# Patient Record
Sex: Male | Born: 1935 | Race: White | Hispanic: No | Marital: Married | Smoking: Never smoker
Health system: Southern US, Community
[De-identification: ages and names within clinical notes are randomized; demographics above are authoritative.]

## PROBLEM LIST (undated history)

## (undated) DIAGNOSIS — C61 Malignant neoplasm of prostate: Secondary | ICD-10-CM

## (undated) DIAGNOSIS — Z860100 Personal history of colon polyps, unspecified: Secondary | ICD-10-CM

## (undated) DIAGNOSIS — I10 Essential (primary) hypertension: Secondary | ICD-10-CM

## (undated) DIAGNOSIS — K5790 Diverticulosis of intestine, part unspecified, without perforation or abscess without bleeding: Secondary | ICD-10-CM

## (undated) DIAGNOSIS — I480 Paroxysmal atrial fibrillation: Secondary | ICD-10-CM

## (undated) DIAGNOSIS — C679 Malignant neoplasm of bladder, unspecified: Secondary | ICD-10-CM

## (undated) DIAGNOSIS — I5032 Chronic diastolic (congestive) heart failure: Secondary | ICD-10-CM

## (undated) DIAGNOSIS — I38 Endocarditis, valve unspecified: Secondary | ICD-10-CM

## (undated) DIAGNOSIS — R42 Dizziness and giddiness: Secondary | ICD-10-CM

## (undated) DIAGNOSIS — H3320 Serous retinal detachment, unspecified eye: Secondary | ICD-10-CM

## (undated) DIAGNOSIS — H269 Unspecified cataract: Secondary | ICD-10-CM

## (undated) HISTORY — DX: Personal history of colon polyps, unspecified: Z86.0100

## (undated) HISTORY — PX: THORACENTESIS: SHX235

## (undated) HISTORY — DX: Malignant neoplasm of bladder, unspecified: C67.9

## (undated) HISTORY — DX: Paroxysmal atrial fibrillation: I48.0

## (undated) HISTORY — DX: Essential (primary) hypertension: I10

## (undated) HISTORY — DX: Dizziness and giddiness: R42

## (undated) HISTORY — PX: TONSILLECTOMY: SUR1361

## (undated) HISTORY — PX: CATARACT EXTRACTION: SUR2

## (undated) HISTORY — PX: SQUAMOUS CELL CARCINOMA EXCISION: SHX2433

## (undated) HISTORY — DX: Unspecified cataract: H26.9

## (undated) HISTORY — PX: COLONOSCOPY: SHX174

## (undated) HISTORY — DX: Diverticulosis of intestine, part unspecified, without perforation or abscess without bleeding: K57.90

## (undated) HISTORY — DX: Endocarditis, valve unspecified: I38

## (undated) HISTORY — DX: Personal history of colonic polyps: Z86.010

## (undated) HISTORY — PX: CYSTOSCOPY WITH BIOPSY: SHX5122

## (undated) HISTORY — DX: Malignant neoplasm of prostate: C61

---

## 1999-07-11 ENCOUNTER — Encounter: Payer: Self-pay | Admitting: Emergency Medicine

## 1999-07-11 ENCOUNTER — Emergency Department (HOSPITAL_COMMUNITY): Admission: EM | Admit: 1999-07-11 | Discharge: 1999-07-11 | Payer: Self-pay | Admitting: Emergency Medicine

## 2000-09-23 ENCOUNTER — Observation Stay (HOSPITAL_COMMUNITY): Admission: RE | Admit: 2000-09-23 | Discharge: 2000-09-24 | Payer: Self-pay | Admitting: Urology

## 2000-09-23 ENCOUNTER — Encounter (INDEPENDENT_AMBULATORY_CARE_PROVIDER_SITE_OTHER): Payer: Self-pay | Admitting: Specialist

## 2001-11-10 ENCOUNTER — Ambulatory Visit (HOSPITAL_BASED_OUTPATIENT_CLINIC_OR_DEPARTMENT_OTHER): Admission: RE | Admit: 2001-11-10 | Discharge: 2001-11-10 | Payer: Self-pay | Admitting: Urology

## 2001-11-10 ENCOUNTER — Encounter (INDEPENDENT_AMBULATORY_CARE_PROVIDER_SITE_OTHER): Payer: Self-pay | Admitting: Specialist

## 2002-04-13 DIAGNOSIS — C61 Malignant neoplasm of prostate: Secondary | ICD-10-CM

## 2002-04-13 HISTORY — DX: Malignant neoplasm of prostate: C61

## 2002-04-13 HISTORY — PX: RADIOACTIVE SEED IMPLANT: SHX5150

## 2002-06-13 ENCOUNTER — Encounter: Admission: RE | Admit: 2002-06-13 | Discharge: 2002-06-13 | Payer: Self-pay | Admitting: Urology

## 2002-06-13 ENCOUNTER — Encounter: Payer: Self-pay | Admitting: Urology

## 2002-06-15 ENCOUNTER — Ambulatory Visit (HOSPITAL_BASED_OUTPATIENT_CLINIC_OR_DEPARTMENT_OTHER): Admission: RE | Admit: 2002-06-15 | Discharge: 2002-06-15 | Payer: Self-pay | Admitting: Urology

## 2002-06-30 ENCOUNTER — Ambulatory Visit: Admission: RE | Admit: 2002-06-30 | Discharge: 2002-07-28 | Payer: Self-pay | Admitting: Radiation Oncology

## 2002-09-05 ENCOUNTER — Ambulatory Visit: Admission: RE | Admit: 2002-09-05 | Discharge: 2002-10-04 | Payer: Self-pay

## 2002-12-14 ENCOUNTER — Ambulatory Visit: Admission: RE | Admit: 2002-12-14 | Discharge: 2003-03-14 | Payer: Self-pay | Admitting: Radiation Oncology

## 2003-02-20 ENCOUNTER — Ambulatory Visit (HOSPITAL_COMMUNITY): Admission: RE | Admit: 2003-02-20 | Discharge: 2003-02-20 | Payer: Self-pay | Admitting: Urology

## 2003-02-20 ENCOUNTER — Ambulatory Visit (HOSPITAL_BASED_OUTPATIENT_CLINIC_OR_DEPARTMENT_OTHER): Admission: RE | Admit: 2003-02-20 | Discharge: 2003-02-20 | Payer: Self-pay | Admitting: Urology

## 2003-03-28 ENCOUNTER — Ambulatory Visit: Admission: RE | Admit: 2003-03-28 | Discharge: 2003-04-05 | Payer: Self-pay | Admitting: Radiation Oncology

## 2003-08-14 ENCOUNTER — Ambulatory Visit: Admission: RE | Admit: 2003-08-14 | Discharge: 2003-08-14 | Payer: Self-pay | Admitting: Radiation Oncology

## 2004-04-13 DIAGNOSIS — K5790 Diverticulosis of intestine, part unspecified, without perforation or abscess without bleeding: Secondary | ICD-10-CM

## 2004-04-13 HISTORY — DX: Diverticulosis of intestine, part unspecified, without perforation or abscess without bleeding: K57.90

## 2004-09-30 ENCOUNTER — Ambulatory Visit: Payer: Self-pay | Admitting: Gastroenterology

## 2004-10-09 ENCOUNTER — Encounter (INDEPENDENT_AMBULATORY_CARE_PROVIDER_SITE_OTHER): Payer: Self-pay | Admitting: Specialist

## 2004-10-09 ENCOUNTER — Ambulatory Visit: Payer: Self-pay | Admitting: Internal Medicine

## 2005-02-03 ENCOUNTER — Ambulatory Visit (HOSPITAL_COMMUNITY): Admission: RE | Admit: 2005-02-03 | Discharge: 2005-02-03 | Payer: Self-pay | Admitting: Urology

## 2005-02-03 ENCOUNTER — Encounter (INDEPENDENT_AMBULATORY_CARE_PROVIDER_SITE_OTHER): Payer: Self-pay | Admitting: *Deleted

## 2005-02-03 ENCOUNTER — Ambulatory Visit (HOSPITAL_BASED_OUTPATIENT_CLINIC_OR_DEPARTMENT_OTHER): Admission: RE | Admit: 2005-02-03 | Discharge: 2005-02-03 | Payer: Self-pay | Admitting: Urology

## 2008-04-13 HISTORY — PX: PLEURAL EFFUSION DRAINAGE: SHX5099

## 2008-09-02 ENCOUNTER — Emergency Department (HOSPITAL_BASED_OUTPATIENT_CLINIC_OR_DEPARTMENT_OTHER): Admission: EM | Admit: 2008-09-02 | Discharge: 2008-09-02 | Payer: Self-pay | Admitting: Emergency Medicine

## 2008-09-02 ENCOUNTER — Ambulatory Visit: Payer: Self-pay | Admitting: Diagnostic Radiology

## 2008-09-04 ENCOUNTER — Ambulatory Visit (HOSPITAL_COMMUNITY): Admission: RE | Admit: 2008-09-04 | Discharge: 2008-09-04 | Payer: Self-pay | Admitting: Internal Medicine

## 2008-09-12 ENCOUNTER — Ambulatory Visit: Payer: Self-pay | Admitting: Thoracic Surgery

## 2008-09-19 ENCOUNTER — Ambulatory Visit: Payer: Self-pay | Admitting: Thoracic Surgery

## 2008-09-19 ENCOUNTER — Encounter: Admission: RE | Admit: 2008-09-19 | Discharge: 2008-09-19 | Payer: Self-pay | Admitting: Thoracic Surgery

## 2008-10-17 ENCOUNTER — Ambulatory Visit: Payer: Self-pay | Admitting: Thoracic Surgery

## 2008-10-17 ENCOUNTER — Encounter: Admission: RE | Admit: 2008-10-17 | Discharge: 2008-10-17 | Payer: Self-pay | Admitting: Thoracic Surgery

## 2009-09-25 ENCOUNTER — Encounter (INDEPENDENT_AMBULATORY_CARE_PROVIDER_SITE_OTHER): Payer: Self-pay | Admitting: *Deleted

## 2009-09-26 ENCOUNTER — Encounter (INDEPENDENT_AMBULATORY_CARE_PROVIDER_SITE_OTHER): Payer: Self-pay | Admitting: *Deleted

## 2009-11-08 ENCOUNTER — Encounter (INDEPENDENT_AMBULATORY_CARE_PROVIDER_SITE_OTHER): Payer: Self-pay | Admitting: *Deleted

## 2009-11-13 ENCOUNTER — Ambulatory Visit: Payer: Self-pay | Admitting: Internal Medicine

## 2009-11-19 ENCOUNTER — Telehealth (INDEPENDENT_AMBULATORY_CARE_PROVIDER_SITE_OTHER): Payer: Self-pay

## 2009-11-25 ENCOUNTER — Telehealth: Payer: Self-pay | Admitting: Internal Medicine

## 2009-11-27 ENCOUNTER — Ambulatory Visit: Payer: Self-pay | Admitting: Internal Medicine

## 2009-11-28 ENCOUNTER — Encounter: Payer: Self-pay | Admitting: Internal Medicine

## 2009-12-02 ENCOUNTER — Ambulatory Visit: Payer: Self-pay | Admitting: Cardiology

## 2009-12-05 ENCOUNTER — Ambulatory Visit: Payer: Self-pay | Admitting: Cardiology

## 2009-12-05 ENCOUNTER — Ambulatory Visit (HOSPITAL_COMMUNITY): Admission: RE | Admit: 2009-12-05 | Discharge: 2009-12-05 | Payer: Self-pay | Admitting: Cardiology

## 2010-03-19 ENCOUNTER — Emergency Department (HOSPITAL_BASED_OUTPATIENT_CLINIC_OR_DEPARTMENT_OTHER)
Admission: EM | Admit: 2010-03-19 | Discharge: 2010-03-20 | Payer: Self-pay | Source: Home / Self Care | Admitting: Emergency Medicine

## 2010-05-13 NOTE — Progress Notes (Signed)
Summary: prep ?'s  Phone Note Call from Patient Call back at Home Phone [phone]   Caller: Patient Call For: Dr. Marina Goodell Reason for Call: Talk to Nurse Summary of Call: prep ?'s Initial call taken by: Vallarie Mare,  November 19, 2009 2:54 PM  Follow-up for Phone Call        Called pt  at home regarding questions he had about what he could eat and not eat prior to his colonoscopy.Answered all his questions. He will call back if he has further questions.Ulis Rias RN  November 19, 2009 3:05 PM

## 2010-05-13 NOTE — Progress Notes (Signed)
Summary: Questions about procedure  Phone Note Call from Patient Call back at Home Phone [phone]   Caller: Patient Call For: Dr. Marina Goodell Reason for Call: Talk to Nurse Summary of Call: pt. has COL sch'd and took some fiber supplements Initial call taken by: Karna Christmas,  November 25, 2009 2:43 PM  Follow-up for Phone Call        Patient was afraid that he messed up his prep by taking a fiber supplement.  I assured him that it was okay, and for him not to take any more until after his procedure. Follow-up by: Clide Cliff RN,  November 25, 2009 3:13 PM

## 2010-05-13 NOTE — Letter (Signed)
Summary: Previsit letter  [facility]  [address] [place], [place] [address]   Phone: [phone]  Fax: [phone]       09/26/2009 MRN: [id number]  Aaron Lucas [address] [place], [place]  [address]  Dear Aaron Lucas,  Welcome to the Gastroenterology Division at [facility].    You are scheduled to see a nurse for your pre-procedure visit on 11-13-09 at 10:00A.M. on the 3rd floor at [facility], [address]  We ask that you try to arrive at our office 15 minutes prior to your appointment time to allow for check-in.  Your nurse visit will consist of discussing your medical and surgical history, your immediate family medical history, and your medications.    Please bring a complete list of all your medications or, if you prefer, bring the medication bottles and we will list them.  We will need to be aware of both prescribed and over the counter drugs.  We will need to know exact dosage information as well.  If you are on blood thinners (Coumadin, Plavix, Aggrenox, Ticlid, etc.) please call our office today/prior to your appointment, as we need to consult with your physician about holding your medication.   Please be prepared to read and sign documents such as consent forms, a financial agreement, and acknowledgement forms.  If necessary, and with your consent, a friend or relative is welcome to sit-in on the nurse visit with you.  Please bring your insurance card so that we may make a copy of it.  If your insurance requires a referral to see a specialist, please bring your referral form from your primary care physician.  No co-pay is required for this nurse visit.     If you cannot keep your appointment, please call [phone] to cancel or reschedule prior to your appointment date.  This allows [place] the opportunity to schedule an appointment for another patient in need of care.    Thank you for choosing [facility] for your medical needs.  We  appreciate the opportunity to care for you.  Please visit [place] at our website  to learn more about our practice.                     Sincerely.                                                                                                                   The Gastroenterology Division

## 2010-05-13 NOTE — Letter (Signed)
Summary: Colonoscopy Letter  [facility]  [address] [place], [place] [address]   Phone: [phone]  Fax: [phone]      September 25, 2009 MRN: [id number]   Aaron Lucas [address] [place], [place]  [address]   Dear [name],   According to your medical record, it is time for you to schedule a Colonoscopy. The American Cancer Society recommends this procedure as a method to detect early colon cancer. Patients with a family history of colon cancer, or a personal history of colon polyps or inflammatory bowel disease are at increased risk.  This letter has been generated based on the recommendations made at the time of your procedure. If you feel that in your particular situation this may no longer apply, please contact our office.  Please call our office at [phone] to schedule this appointment or to update your records at your earliest convenience.  Thank you for cooperating with [place] to provide you with the very best care possible.   Sincerely,  Wilhemina Bonito. Marina Goodell, M.D.  [facility] Gastroenterology Division [phone]

## 2010-05-13 NOTE — Miscellaneous (Signed)
Summary: [facility] Previsit/prep  Clinical Lists Changes  Medications: Added new medication of MOVIPREP 100 GM  SOLR (PEG-KCL-NACL-NASULF-NA ASC-C) As per prep instructions. - Signed Rx of MOVIPREP 100 GM  SOLR (PEG-KCL-NACL-NASULF-NA ASC-C) As per prep instructions.;  #1 x 0;  Signed;  Entered by: Wyona Almas RN;  Authorized by: Hilarie Fredrickson MD;  Method used: Electronically to [facility] [phone]*, [address] [place], [place]  [address], Ph: [phone], Fax: [phone] Allergies: Added new allergy or adverse reaction of PENICILLIN Added new allergy or adverse reaction of * DIAMOX Added new allergy or adverse reaction of CIPRO Added new allergy or adverse reaction of ERYTHROMYCIN Added new allergy or adverse reaction of DOXYCYCLINE Added new allergy or adverse reaction of * NEPTAZANE Observations: Added new observation of NKA: F (11/13/2009 9:51)    Prescriptions: MOVIPREP 100 GM  SOLR (PEG-KCL-NACL-NASULF-NA ASC-C) As per prep instructions.  #1 x 0   Entered by:   Wyona Almas RN   Authorized by:   Hilarie Fredrickson MD   Signed by:   Wyona Almas RN on 11/13/2009   Method used:   Electronically to        [facility] [phone]* (retail)       [address]       [place], [place]  [address]       Ph: [phone]       Fax: [phone]   RxID:   [id number]

## 2010-05-13 NOTE — Letter (Signed)
Summary: [facility] Instructions  [facility]  [address] [place], [place] [address]   Phone: [phone]  Fax: [phone]       TRUMAN ACEITUNO    06/22/1928    MRN: [id number]        Procedure Day Dorna Bloom:  [facility]  11/27/09     Arrival Time:  7:30AM      Procedure Time:  8:00AM     Location of Procedure:                    _ X_  [facility] (4th Floor)                       PREPARATION FOR COLONOSCOPY WITH MOVIPREP   Starting 5 days prior to your procedure 11/22/09 do not eat nuts, seeds, popcorn, corn, beans, peas,  salads, or any raw vegetables.  Do not take any fiber supplements (e.g. Metamucil, Citrucel, and Benefiber).  THE DAY BEFORE YOUR PROCEDURE         DATE: 11/26/09  DAY: TUESDAY  1.  Drink clear liquids the entire day-NO SOLID FOOD  2.  Do not drink anything colored red or purple.  Avoid juices with pulp.  No orange juice.  3.  Drink at least 64 oz. (8 glasses) of fluid/clear liquids during the day to prevent dehydration and help the prep work efficiently.  CLEAR LIQUIDS INCLUDE: Water Jello Ice Popsicles Tea (sugar ok, no milk/cream) Powdered fruit flavored drinks Coffee (sugar ok, no milk/cream) Gatorade Juice: apple, white grape, white cranberry  Lemonade Clear bullion, consomm, broth Carbonated beverages (any kind) Strained chicken noodle soup Hard Candy                             4.  In the morning, mix first dose of MoviPrep solution:    Empty 1 Pouch A and 1 Pouch B into the disposable container    Add lukewarm drinking water to the top line of the container. Mix to dissolve    Refrigerate (mixed solution should be used within 24 hrs)  5.  Begin drinking the prep at 5:00 p.m. The MoviPrep container is divided by 4 marks.   Every 15 minutes drink the solution down to the next mark (approximately 8 oz) until the full liter is complete.   6.  Follow completed prep with 16 oz of clear liquid of your choice  (Nothing red or purple).  Continue to drink clear liquids until bedtime.  7.  Before going to bed, mix second dose of MoviPrep solution:    Empty 1 Pouch A and 1 Pouch B into the disposable container    Add lukewarm drinking water to the top line of the container. Mix to dissolve    Refrigerate  THE DAY OF YOUR PROCEDURE      DATE: 11/27/09  DAY: WEDNESDAY  Beginning at 3:00AM (5 hours before procedure):         1. Every 15 minutes, drink the solution down to the next mark (approx 8 oz) until the full liter is complete.  2. Follow completed prep with 16 oz. of clear liquid of your choice.    3. You may drink clear liquids until 6:00AM (2 HOURS BEFORE PROCEDURE).   MEDICATION INSTRUCTIONS  Unless otherwise instructed, you should take regular prescription medications with a small sip of water   as early as possible the morning  of your procedure.  Additional medication instructions: Hold Lisinopril/HCTZ the morning of procedure.         OTHER INSTRUCTIONS  You will need a responsible adult at least 75 years of age to accompany you and drive you home.   This person must remain in the waiting room during your procedure.  Wear loose fitting clothing that is easily removed.  Leave jewelry and other valuables at home.  However, you may wish to bring a book to read or  an iPod/MP3 player to listen to music as you wait for your procedure to start.  Remove all body piercing jewelry and leave at home.  Total time from sign-in until discharge is approximately 2-3 hours.  You should go home directly after your procedure and rest.  You can resume normal activities the  day after your procedure.  The day of your procedure you should not:   Drive   Make legal decisions   Operate machinery   Drink alcohol   Return to work  You will receive specific instructions about eating, activities and medications before you leave.    The above instructions have been reviewed and  explained to me by  Wyona Almas RN  November 13, 2009 10:23 AM _    I fully understand and can verbalize these instructions _____________________________ Date _________

## 2010-05-13 NOTE — Letter (Signed)
Summary: Patient Notice- Polyp Results  [facility]  [address] [place], [place] [address]   Phone: [phone]  Fax: [phone]        November 28, 2009 MRN: [id number]    Aaron Lucas [address] [place], [place]  [address]    Dear Aaron Lucas,  I am pleased to inform you that the colon polyp(s) removed during your recent colonoscopy was (were) found to be benign (no cancer detected) upon pathologic examination.  I recommend you have a repeat colonoscopy examination in 3 years to look for recurrent polyps, as having colon polyps increases your risk for having recurrent polyps or even colon cancer in the future.  Should you develop new or worsening symptoms of abdominal pain, bowel habit changes or bleeding from the rectum or bowels, please schedule an evaluation with either your primary care physician or with me.  Additional information/recommendations:  __ No further action with gastroenterology is needed at this time. Please      follow-up with your primary care physician for your other healthcare      needs.   Please call us if you are having persistent problems or have questions about your condition that have not been fully answered at this time.  Sincerely,  Hilarie Fredrickson MD  This letter has been electronically signed by your physician.  Appended Document: Patient Notice- Polyp Results letter mailed

## 2010-05-13 NOTE — Procedures (Signed)
Summary: Colonoscopy  Patient: Aaron Lucas Note: All result statuses are Final unless otherwise noted.  Tests: (1) Colonoscopy (COL)   COL Colonoscopy           DONE     [facility]     [address]     [place], [place]  [address]           COLONOSCOPY PROCEDURE REPORT           PATIENT:  Aaron Lucas, Aaron Lucas  MR#:  [id number]     BIRTHDATE:  01-09-1929, 80 yrs. old  GENDER:  male     ENDOSCOPIST:  Wilhemina Bonito. Eda Keys, MD     REF. BY:  Surveillance Program Recall,     PROCEDURE DATE:  11/27/2009     PROCEDURE:  Colonoscopy with snare polypectomy x 4     ASA CLASS:  Class II     INDICATIONS:  history of pre-cancerous (adenomatous) colon polyps,     surveillance and high-risk screening ;index exam 6-06 w/ small     adenoma     MEDICATIONS:   Fentanyl 75 mcg IV, Versed 7 mg IV           DESCRIPTION OF PROCEDURE:   After the risks benefits and     alternatives of the procedure were thoroughly explained, informed     consent was obtained.  Digital rectal exam was performed and     revealed no abnormalities.   The LB CF-[id number] endoscope     was introduced through the anus and advanced to the cecum, which     was identified by both the appendix and ileocecal valve, without     limitations.Time to cecum = 4:17  min.  The quality of the prep     was excellent, using MoviPrep.  The instrument was then slowly     withdrawn (time = 18;13 min) as the colon was fully examined.     <<PROCEDUREIMAGES>>           FINDINGS:  Four polyps, all , 5mm, were found in the cecum,     transverse (2), and descending colon. Polyps were snared without     cautery. Retrieval was successful.   Moderate diverticulosis was     found in the left colon.   Retroflexed views in the rectum     revealed internal hemorrhoids.    The scope was then withdrawn     from the patient and the procedure completed.           COMPLICATIONS:  None     ENDOSCOPIC IMPRESSION:     1) Four polyps - removed     2)  Moderate diverticulosis in the left colon     3) Internal hemorrhoids           RECOMMENDATIONS:     1) Follow up colonoscopy in 3 years if 3 or more adenomas;     otherwise 5 years           ______________________________     Wilhemina Bonito. Eda Keys, MD           CC:  Geoffry Paradise, MD;The Patient           n.     Rosalie DoctorWilhemina Bonito. Eda Keys at 11/27/2009 08:51 AM           Ruthell Rummage, [id number]  Note: An exclamation mark (!) indicates a result that was not dispersed into the flowsheet.  Document Creation Date: 11/27/2009 8:51 AM _______________________________________________________________________  (1) Order result status: Final Collection or observation date-time: 11/27/2009 08:43 Requested date-time:  Receipt date-time:  Reported date-time:  Referring Physician:   Ordering Physician: Fransico Setters [phone]) Specimen Source:  Source: Launa Grill Order Number: [id number] Lab site:   Appended Document: Colonoscopy recall 3 yr     Procedures Next Due Date:    Colonoscopy: 11/2012

## 2010-05-16 ENCOUNTER — Emergency Department (INDEPENDENT_AMBULATORY_CARE_PROVIDER_SITE_OTHER): Payer: Medicare Other

## 2010-05-16 ENCOUNTER — Emergency Department (HOSPITAL_BASED_OUTPATIENT_CLINIC_OR_DEPARTMENT_OTHER)
Admission: EM | Admit: 2010-05-16 | Discharge: 2010-05-16 | Disposition: A | Discharge status: Home / Self Care | Payer: Medicare Other | Attending: Emergency Medicine | Admitting: Emergency Medicine

## 2010-05-16 DIAGNOSIS — M79609 Pain in unspecified limb: Secondary | ICD-10-CM | POA: Insufficient documentation

## 2010-05-16 DIAGNOSIS — I10 Essential (primary) hypertension: Secondary | ICD-10-CM | POA: Insufficient documentation

## 2010-05-16 DIAGNOSIS — M7989 Other specified soft tissue disorders: Secondary | ICD-10-CM

## 2010-05-16 DIAGNOSIS — W19XXXA Unspecified fall, initial encounter: Secondary | ICD-10-CM

## 2010-05-16 DIAGNOSIS — IMO0002 Reserved for concepts with insufficient information to code with codable children: Secondary | ICD-10-CM | POA: Insufficient documentation

## 2010-05-16 DIAGNOSIS — Y92838 Other recreation area as the place of occurrence of the external cause: Secondary | ICD-10-CM | POA: Insufficient documentation

## 2010-05-16 DIAGNOSIS — Y9239 Other specified sports and athletic area as the place of occurrence of the external cause: Secondary | ICD-10-CM | POA: Insufficient documentation

## 2010-06-23 ENCOUNTER — Ambulatory Visit (INDEPENDENT_AMBULATORY_CARE_PROVIDER_SITE_OTHER): Payer: Medicare Other | Admitting: Cardiology

## 2010-06-23 DIAGNOSIS — I359 Nonrheumatic aortic valve disorder, unspecified: Secondary | ICD-10-CM

## 2010-06-23 DIAGNOSIS — I10 Essential (primary) hypertension: Secondary | ICD-10-CM

## 2010-06-24 LAB — POCT CARDIAC MARKERS
CKMB, poc: 2 ng/mL (ref 1.0–8.0)
Myoglobin, poc: 55.4 ng/mL (ref 12–200)
Troponin i, poc: <0.05 ng/mL (ref 0.00–0.09)

## 2010-06-24 LAB — DIFFERENTIAL
Basophils Absolute: 0.1 10*3/uL (ref 0.0–0.1)
Basophils Relative: 1 % (ref 0–1)
Eosinophils Absolute: 0.1 10*3/uL (ref 0.0–0.7)
Eosinophils Relative: 1 % (ref 0–5)
Monocytes Relative: 11 % (ref 3–12)
Neutro Abs: 3.7 10*3/uL (ref 1.7–7.7)

## 2010-06-24 LAB — BASIC METABOLIC PANEL
BUN: 16 mg/dL (ref 6–23)
CO2: 24 mEq/L (ref 19–32)
Calcium: 8.8 mg/dL (ref 8.4–10.5)
Chloride: 100 mEq/L (ref 96–112)
GFR calc Af Amer: >60 mL/min (ref >60)
GFR calc non Af Amer: >60 mL/min (ref >60)

## 2010-06-24 LAB — CBC
HCT: 37.1 % — ABNORMAL LOW (ref 39.0–52.0)
Hemoglobin: 12.8 g/dL — ABNORMAL LOW (ref 13.0–17.0)
MCV: 92.9 fL (ref 78.0–100.0)
Platelets: 130 10*3/uL — ABNORMAL LOW (ref 150–400)
RBC: 3.99 MIL/uL — ABNORMAL LOW (ref 4.22–5.81)
RDW: 12.3 % (ref 11.5–15.5)
WBC: 5.8 10*3/uL (ref 4.0–10.5)

## 2010-07-22 LAB — BODY FLUID CELL COUNT WITH DIFFERENTIAL
Eos, Fluid: 3 %
Lymphs, Fluid: 47 %
Monocyte-Macrophage-Serous Fluid: 13 % — ABNORMAL LOW (ref 50–90)
Neutrophil Count, Fluid: 37 % — ABNORMAL HIGH (ref 0–25)
Total Nucleated Cell Count, Fluid: 5985 cu mm — ABNORMAL HIGH (ref 0–1000)

## 2010-07-22 LAB — BODY FLUID CULTURE
Culture: NO GROWTH
Gram Stain: NONE SEEN

## 2010-07-22 LAB — PATHOLOGIST SMEAR REVIEW

## 2010-08-26 NOTE — Letter (Signed)
September 19, 2008   Geoffry Paradise, M.D.  [address]  [place]  [place] [address]   Re:  Aaron Lucas, Aaron Lucas               DOB:  November 20, 1928   Dear Carmine Savoy,   The patient came back today after a CT scan.  His blood pressure is  148/76, pulse 72, respirations 18, and saturations were 98%.  Lungs were  clear to auscultation and percussion.  CT scan showed a small left  pleural effusion and healing rib fractures.  I do not feel he needs any  further therapy at the present time, but I will continue to follow him  and told him to come back if he gets shortness of breath or if starts  running a temperature.  If not, I will see him back in 4 weeks with a  chest x-ray.   Ines Bloomer, M.D.  Electronically Signed   DPB/MEDQ  D:  09/19/2008  T:  09/20/2008  Job:  [id number]

## 2010-08-26 NOTE — Letter (Signed)
September 12, 2008   Geoffry Paradise, MD  [address]  [place], [place] [address]   Re:  Aaron Lucas, Aaron Lucas               DOB:  11/18/1928   Dear Raiford Noble:   I appreciate seeing the patient.  Apparently this 77 year old the  patient fell at home approximately 5 weeks ago and so he developed  shortness of breath and weakness and was found to have a left pleural  effusion and rib fractures on the left from 3 through 8.  Had a  thoracentesis, which was done.  I do not know if cytologies were done,  but the fluid had a 5000 white cell count and mesothelial cells that  went along with reactive process.  He has had no fever or chills, but he  says that he has been having minimal pain now on his left chest but just  continues to have some weakness.  He just finished a course of Avelox  for 7 days that was given in the emergency room.   MEDICATIONS:  1. Multivitamins.  2. Clindamycin 150 mg a day.  3. Lisinopril 20/12.5.   ALLERGIES:  He is allergic to doxycycline, Naprosyn, erythromycin,  Cipro, Diamox, and penicillin.   FAMILY HISTORY:  Noncontributory except his father had a blood clot  after surgery.   SOCIAL HISTORY:  He is married and has 1 child.  Does not smoke,  occasional alcohol, glass of wine.   REVIEW OF SYSTEMS:  Vital Signs:  He is 195 pounds.  He is 5 feet 10  inches.  General:  His weight has been stable.  Cardiac:  No angina or  atrial fibrillation, but does have mitral valve prolapse.  Pulmonary:  No hemoptysis.  GI:  No nausea, vomiting, constipation, or diarrhea.  GU:  He had prostate cancer.  No kidney disease.  Vascular:  No  claudication, DVT.  Neurologic:  No dizziness, headaches, blackouts, or  seizures.  Musculoskeletal:  No arthritis.  Psychiatric:  No psychiatric  illnesses.  Eyes/ENT:  No changes in eyesight or hearing.  Hematologic:  No problems with bleeding or clotting disorders.   PHYSICAL EXAMINATION:  GENERAL:  He is a well-developed Caucasian male  in no  acute distress.  VITAL SIGNS:  His blood pressure was 134/80, pulse 81, respirations 18,  and saturations were 97%.  HEAD, EYES, EARS, NOSE, AND THROAT:  Unremarkable.  NECK:  Supple without thyromegaly.  There is no supraclavicular or  axillary adenopathy.  CHEST:  Clear to auscultation and percussion.  HEART:  Regular.  Sinus rhythm.  No murmurs.  ABDOMEN:  Soft.  There is no hepatosplenomegaly.  EXTREMITIES:  Pulses are 2+.  There is no clubbing or edema.  NEUROLOGIC:  He is oriented x3.  Sensory and motor intact.  Cranial  nerves are intact.   I have gone ahead and ordered a CT scan on him to look at what residual  fluid there is in the left lower lobe and also to look at his rib  fractures.  I do not know whether he will require any further drainage  procedure on the left side.  He still is somewhat weak and he has  chronic cough from his previous surgery, and I will see him back again  in 1 week to make further determination.  I appreciate the opportunity  of seeing the patient.   Sincerely,   Ines Bloomer, M.D.  Electronically Signed   DPB/MEDQ  D:  09/12/2008  T:  09/12/2008  Job:  [id number]

## 2010-08-26 NOTE — Letter (Signed)
October 17, 2008   Geoffry Paradise, MD  [address]  [place], [place] [address]   Re:  Aaron Lucas, Aaron Lucas               DOB:  1929/02/25   Dear Raiford Noble.   I saw the patient back in the office today.  His chest x-ray showed that  his pleural effusion has resolved and his fractures are healing well.  He is doing well overall.  His lungs were clear to auscultation and  percussion.  His blood pressure was 125/78, pulse 70, respirations 18,  and sats were 98%.  We will release him back to you.  I will be happy to  see him again if he has any future problems.   Sincerely,   Ines Bloomer, M.D.  Electronically Signed   DPB/MEDQ  D:  10/17/2008  T:  10/17/2008  Job:  [id number]

## 2010-08-29 NOTE — Op Note (Signed)
NAME:  Aaron Lucas, Aaron Lucas                         ACCOUNT NO.:  [id number]   MEDICAL RECORD NO.:  [id number]                   PATIENT TYPE:  AMB   LOCATION:  [facility]                                 FACILITY:  [facility]   PHYSICIAN:  Excell Seltzer. Annabell Howells, M.D.                 DATE OF BIRTH:  [birth year]   DATE OF PROCEDURE:  06/15/2002  DATE OF DISCHARGE:                                 OPERATIVE REPORT   PROCEDURES:  1. Prostate ultrasound with ultrasound with ultrasound-guided prostate     biopsy.  2. Cystoscopy.  Bilateral retrograde pyelograms with interpretation.  3. Biopsy and fulguration of 1 cm transitional cell carcinoma of the left     trigone.  4. Instillation of Mitomycin C.   PREOPERATIVE DIAGNOSES:  1. Prostate nodule with PSA elevation.  2. Recurrent transitional cell carcinoma of the bladder.   POSTOPERATIVE DIAGNOSES:  1. Prostate nodule with PSA elevation.  2. Recurrent transitional cell carcinoma of the bladder.   SURGEON:  Excell Seltzer. Annabell Howells, M.D.   ANESTHESIA:  General.   SPECIMENS:  12 core prostate biopsy and cut biopsies of the bladder tumor.   DRAIN:  Foley catheter.   COMPLICATIONS:  None.   INDICATIONS:  Mr. Papadopoulos is a 77 year old white male with a history of  recurrent low-grade transitional cell carcinoma of the bladder, was found on  recent surveillance cystoscopy to have a recurrence at the trigone and the  bladder neck.  Additionally, he was noted on rectal exam to have a nodule in  the left side of the prostate gland.   FINDINGS AND PROCEDURE:  The patient was taken to the operating room where a  general anesthetic was induced.  He was placed in the lithotomy position.  He had received antibiotics preoperatively.  A transrectal ultrasound of the  prostate was performed with the Siemens system using the 7.5 MHz probe.  The  examination revealed normal-appearing seminal vesicles.  The prostate was  symmetrical.  There was a questionable hypoechoic area of  the left base.  The remainder of the peripheral exam was isochoric.  The transitional zone  had normal variegated echo texture.  There was a calcification noted in the  mid apical region of the transitional zone.  Sagittal views revealed normal  prostatic seminal vesicle angle.  The hypoechoic area was indistinct but  still present in the left bladder aspect of the prostate.  The prostate  volume was 38 mL.  After completion of diagnostic scan, the patient  underwent a 12 core pattern biopsy with care taken to target hypoechoic area  in the left lateral aspect.  After completion of the prostate biopsy, his  genitalia and perineum were prepped with Betadine solution.  He was draped  in the usual sterile fashion, and cystoscopy was performed using a 22 [place]  scope and 12 and 70 degree lenses.  Examination revealed a normal urethra.  The external sphincter was intact.  The prostatic urethra was somewhat  elongated with bilobar hyperplasia, mild obstruction.  Previously, there had  been a tumor right at the bladder neck, but I believe this has gotten  knocked off by the cystoscope per injury.  There was a little bleeding of  the prostatic urethra and urethra from the prostate biopsy.  Examination of  the bladder wall revealed mild trabeculation.  There was an approximately 1  cm low-grade-appearing papillary tumor just lateral to the left ureteral  orifice.  No other bladder wall lesions were identified.  Retrograde  pyelograms were then performed using 5 French open-end catheter.  The right  side was unremarkable.  The left side was unremarkable.  After completion of  the retrograde pyelograms, the cup biopsy forceps was used to remove the  entire 1 cm bladder tumor from the left trigone.  The biopsy site was then  fulgurated with the Bugbee electrode with care being taken to avoid injury  to the ureteral orifice.  The extent of the fulgurated area was  approximately 1.5 cm in diameter.  I  then performed fulguration at the  bladder neck where the other tumor had previously been located as well as  some minor fulguration in the right apical area of the prostate to control  some minor bleeding in this area.  At this point, the bladder was drained,  and an 71 French Foley catheter was inserted.  The balloon was filled with  10 mL of sterile fluid.  Catheter was irrigated; irrigant returned clear.  The bladder was then drained, and then 40 mL of solution with 40 mg of  Mitomycin C was instilled into the bladder, and the catheter was plugged.  The patient was taken down from lithotomy position.  His anesthetic was  reversed.  He was moved to the recovery room in stable condition.  There  were no complications.                                               Excell Seltzer. Annabell Howells, M.D.    JJW/MEDQ  D:  06/15/2002  T:  06/15/2002  Job:  [id number]   cc:   Geoffry Paradise, M.D.  [address]  [place]  [place] [address]  Fax: [phone]

## 2010-08-29 NOTE — Op Note (Signed)
NAME:  Aaron Lucas, Aaron Lucas                         ACCOUNT NO.:  [id number]   MEDICAL RECORD NO.:  [id number]                   PATIENT TYPE:  AMB   LOCATION:  [facility]                                 FACILITY:  [facility]   PHYSICIAN:  Excell Seltzer. Annabell Howells, M.D.                 DATE OF BIRTH:  September 13, 1928   DATE OF PROCEDURE:  02/20/2003  DATE OF DISCHARGE:                                 OPERATIVE REPORT   PROCEDURE:  Iodine-125 prostate seed implantation and cysto.   PREOPERATIVE DIAGNOSIS:  Prostate cancer.   POSTOPERATIVE DIAGNOSIS:  Prostate cancer.   SURGEON:  Excell Seltzer. Annabell Howells, M.D.   RADIATION ONCOLOGIST:  Billie Lade, M.D.   ANESTHESIA:  General.   DRAIN:  20-French Foley catheter.   COMPLICATIONS:  None.   INDICATIONS:  Mr. 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  He also has a history of transitional cell  carcinoma of the bladder.  He had elected seed implantation.  His prostate  was initially too large, but he was placed on Lupron for downstaging and had  a remarkable response and was felt to be a candidate for seeds.   FINDINGS AND PROCEDURE:  The patient was taken to the operating room.  He  received IV Tequin preoperatively.  A general anesthetic was induced.  He  was placed in the lithotomy position.  His genitalia was prepped.  A Foley  catheter was inserted.  The balloon was filled with contrast, and the  catheter was placed to straight drainage.  His perineum was shaved.  His  scrotum was secured cephalad with an Op-Site dressing.  A red rubber rectal  catheter was placed.  At this point, the ultrasound probe was assembled and  inserted and secured to the ultrasound holder.  The position of the probe  was adjusted until the picture matched the treatment plan.  Once in good  position and secured, the perineum was prepped, and he was draped with  sterile towels.  The fluoroscopic unit was brought in and positioned  appropriately.  The seed implant was then placed and secured.  At this  point, stabilization needles were placed and secured.  The distance from the  bladder neck to the probe was then measured, lying a reference plane of 1.5,  and seed implantation was then performed per the preoperative plan.  He had  27 needles with 71 seeds placed without difficulty.  After the seeds had  been completely deployed, fluoroscopic views were obtained with the probe in  and probe out.  At this point, the stabilization needles were removed.  The  patient's drapes were removed.  His Foley catheter was removed.  During  removal of the catheter, one four-seed strand was pulled out with the  catheter.  At this point, cystoscopy was performed with a 16-French flexible  scope.  Examination revealed a  normal urethra and an intact external  sphincter.  The prostatic urethra was short with some bilobar hyperplasia,  greater on the left than the right.  No evidence of seeds or bleeding were  noted in the urethra.  Examination of the bladder revealed a small clot.  No  obvious strands were protruding into the bladder.  No loose seeds were noted  in the bladder.  After the completion of the cystoscopy, the scope was  removed, and a 22-French Foley catheter was inserted.  The balloon was  filled with 10 mL of sterile fluid. The catheter was irrigated with an  [place] syringe until a small clot was aspirated and the urine cleared.  The  catheter was placed to straight drainage.  The perineum was cleansed and  dressed.  The patient was taken down from the lithotomy position.  His  anesthetic was reversed.  He was moved to the recovery room in stable  condition, having no complications.                                               Excell Seltzer. Annabell Howells, M.D.    JJW/MEDQ  D:  02/20/2003  T:  02/20/2003  Job:  [id number]   cc:   Geoffry Paradise, M.D.  [address]  [place]  [place] [address]  Fax: [phone]   Billie Lade,  M.D.  [address] - [facility]  [place]  [place] [address]  Fax: [phone]

## 2010-08-29 NOTE — Op Note (Signed)
TNAMEEzra, Denne.:  [id number]   MEDICAL RECORD NO.:  [id number]                   PATIENT TYPE:   LOCATION:                                       FACILITY:   PHYSICIAN:  Excell Seltzer. Annabell Howells, M.D.                 DATE OF BIRTH:   DATE OF PROCEDURE:  11/10/2001  DATE OF DISCHARGE:                                 OPERATIVE REPORT   PROCEDURE:  Cystoscopy, bladder biopsy, fulguration, installation of  mitomycin C.   PREOPERATIVE DIAGNOSIS:  Recurrent transitional cell carcinoma of the  bladder.   POSTOPERATIVE DIAGNOSIS:  Recurrent transitional cell carcinoma of the  bladder.   SURGEON:  Excell Seltzer. Annabell Howells, M.D.   ANESTHESIA:  General.   SPECIMENS:  Bladder biopsies including tumors from the dome and abnormal  mucosa from the dome.   DRAINS:  Foley catheter.   COMPLICATIONS:  None.   INDICATIONS FOR PROCEDURE:  The patient is a 77 year old white male with a  history of transitional cell carcinoma of the bladder. He has had some  recurrences on the dome. He returns today for biopsy and fulguration of  these lesions.   FINDINGS AND PROCEDURE:  He was given p.o. Tequin and taken to the operating  room where a general anesthetic was induced. He was placed in the lithotomy  position. His perineum and genitalia were prepped with Betadine solution, he  was draped in the usual sterile fashion. Cystoscopy was performed using the  22-French scope and 12 and 70 degree lenses. Examination revealed a normal  urethra.  The external sphincter was intact.  The prostatic urethra had some  bilobar hyperplasia without significant obstruction. Examination of the  bladder revealed mild trabeculation. The ureteral orifices were in their  normal anatomic position effluxing clear urine. On the dome of the bladder,  were three tumors 1-2 cm apart, the largest measuring approximately 5 mm,  the smallest about 2 mm. Surrounding these tumors were some slightly pink  heaped-up mucosa that appeared atypical, possibly consistent with carcinoma  in situ.   After a thorough inspection, the visible tumors were removed with cut biopsy  forceps and then two additional biopsies were obtained from the abnormal  surrounding mucosa. Following this, the biopsy sites were fulgurated with  the Bugbee electrode and then the abnormal mucosa was widely fulgurated with  the Bugbee electrode. The area of fulguration was approximately 3 x 5 cm.  Once the fulguration had been completed, the bladder was drained, a 16-  [place] Foley catheter was placed and 40 mg of mitramycin C and 40 cc of  Diluent were placed in the bladder and the catheter was plugged. This will  be maintained for 30 minutes.   At this point, the patient was taken down from the lithotomy position and  his anesthetic was reversed and he was removed to the recovery room in  stable condition. There were no  complications.                                               Excell Seltzer. Annabell Howells, M.D.    JJW/MEDQ  D:  11/10/2001  T:  11/15/2001  Job:  [id number]

## 2010-08-29 NOTE — Op Note (Signed)
NAME:  Aaron Lucas, Aaron Lucas               ACCOUNT NO.:  [id number]   MEDICAL RECORD NO.:  [id number]          PATIENT TYPE:  AMB   LOCATION:  [facility]                         FACILITY:  [facility]   PHYSICIAN:  Excell Seltzer. Annabell Howells, M.D.    DATE OF BIRTH:  [birth year]   DATE OF PROCEDURE:  02/03/2005  DATE OF DISCHARGE:                                 OPERATIVE REPORT   PROCEDURE:  Cystoscopy, bilateral retrograde pyelograms with interpretation,  biopsy of lesions of the right base, posterior wall, and left trigone with  fulguration.   PREOPERATIVE DIAGNOSIS:  Recurrent transitional cell carcinoma of bladder.   POSTOPERATIVE DIAGNOSIS:  Recurrent transitional cell carcinoma of bladder.   SURGEON:  Dr. Bjorn Pippin.   ANESTHESIA:  General.   SPECIMEN:  Biopsies from right base posterior, wall, and multiple biopsies  from the left trigone.   DRAINS:  None.   COMPLICATIONS:  None.   INDICATIONS:  Mr. Kaps is a 77 year old white male history of recurrent  transitional cell carcinoma of the bladder who recently underwent office  cystoscopy and biopsy and was found to have a low grade papillary non-  invasive tumor in the area the left trigone. He returns today for more  complete management of that lesion as well as biopsy of additional bladder  lesions and retrograde pyelography.   FINDINGS OF PROCEDURE:  The patient is given IV gentamicin preoperatively,  was taken operating room where general anesthetic was induced. He was placed  in lithotomy position. His perineum and genitalia were prepped with Betadine  solution.  He was draped in the usual sterile fashion. Cystoscopy was  performed using a 22-French scope and the 12 and  70 degrees lenses.  Examination revealed a normal urethra. The external sphincter was intact.  The prostatic urethra was short with bilobar hyperplasia with mild  obstruction. Examination of bladder revealed mild to moderate trabeculation.  Ureteral orifices were  unremarkable, effluxing clear urine. Just lateral to  and cephalad to the left ureteral orifice was approximately 2 cm area of  spreading papillary changes at the site of the most recent biopsy. This  appears to be consistent with either a carcinoma in situ or papillary  transitional cell carcinoma. The lesion appears to have enlarged since the  initial cysto in the office. There was neovascularity on the posterior wall  from his prior prostate radiation for prostate cancer and in about two to  three areas there were some small erythematous patches of mucosa that were  possibly radiation changes versus early neoplastic change.   Retrograde pyelograms were then performed using a 5-French open-ended  catheter. The right ureteral orifice was cannulated initially and contrast  was instilled.  This demonstrated a normal ureter and internal collecting  system. The left ureter was then cannulated and contrast was instilled once  again with the finding of a normal intra-renal collecting system and ureter.   After completion of retrograde pyelograms the cup biopsy forceps was used to  biopsy the erythematous areas on the posterior wall of bladder and right  base that I feel most likely were radiation change.  Once these biopsies were  performed, approximately six biopsies were obtained from the lesion in the  left trigone area, removing the entire the area of mucosal abnormality.  An  attempt was made to get deep enough to get a bit of the muscle as well.  Once all of the biopsies had been performed the biopsy sites were widely  fulgurated with a Bugbee electrode.   Once hemostasis was assured, the bladder was drained. The cystoscope was  removed. The patient was taken down from lithotomy position. His anesthetic  was reversed.  He was admitted to recovery room in stable condition.  There  were no complications.      Excell Seltzer. Annabell Howells, M.D.  Electronically Signed     JJW/MEDQ  D:  02/03/2005   T:  02/03/2005  Job:  [id number]

## 2010-10-29 ENCOUNTER — Observation Stay (HOSPITAL_COMMUNITY)
Admission: EM | Admit: 2010-10-29 | Discharge: 2010-11-01 | Disposition: A | Discharge status: Home / Self Care | Payer: Medicare Other | Attending: Internal Medicine | Admitting: Internal Medicine

## 2010-10-29 ENCOUNTER — Emergency Department (HOSPITAL_COMMUNITY): Payer: Medicare Other

## 2010-10-29 DIAGNOSIS — Z8546 Personal history of malignant neoplasm of prostate: Secondary | ICD-10-CM | POA: Insufficient documentation

## 2010-10-29 DIAGNOSIS — Z860100 Personal history of colon polyps, unspecified: Secondary | ICD-10-CM | POA: Insufficient documentation

## 2010-10-29 DIAGNOSIS — I080 Rheumatic disorders of both mitral and aortic valves: Secondary | ICD-10-CM | POA: Insufficient documentation

## 2010-10-29 DIAGNOSIS — R42 Dizziness and giddiness: Principal | ICD-10-CM | POA: Insufficient documentation

## 2010-10-29 DIAGNOSIS — Z7901 Long term (current) use of anticoagulants: Secondary | ICD-10-CM | POA: Insufficient documentation

## 2010-10-29 DIAGNOSIS — C679 Malignant neoplasm of bladder, unspecified: Secondary | ICD-10-CM | POA: Insufficient documentation

## 2010-10-29 DIAGNOSIS — I4891 Unspecified atrial fibrillation: Secondary | ICD-10-CM | POA: Insufficient documentation

## 2010-10-29 DIAGNOSIS — Z79899 Other long term (current) drug therapy: Secondary | ICD-10-CM | POA: Insufficient documentation

## 2010-10-29 DIAGNOSIS — I10 Essential (primary) hypertension: Secondary | ICD-10-CM | POA: Insufficient documentation

## 2010-10-29 DIAGNOSIS — R269 Unspecified abnormalities of gait and mobility: Secondary | ICD-10-CM | POA: Insufficient documentation

## 2010-10-29 LAB — TROPONIN I: Troponin I: <0.3 ng/mL (ref <0.30)

## 2010-10-29 LAB — URINALYSIS, ROUTINE W REFLEX MICROSCOPIC
Bilirubin Urine: NEGATIVE
Glucose, UA: NEGATIVE mg/dL
Hgb urine dipstick: NEGATIVE
Nitrite: NEGATIVE
Protein, ur: NEGATIVE mg/dL
Specific Gravity, Urine: 1.015 (ref 1.005–1.030)
pH: 7 (ref 5.0–8.0)

## 2010-10-29 LAB — BASIC METABOLIC PANEL
BUN: 11 mg/dL (ref 6–23)
CO2: 25 mEq/L (ref 19–32)
Chloride: 97 mEq/L (ref 96–112)
Creatinine, Ser: 0.6 mg/dL (ref 0.50–1.35)
GFR calc Af Amer: >60 mL/min (ref >60)
Potassium: 3.2 mEq/L — ABNORMAL LOW (ref 3.5–5.1)
Sodium: 132 mEq/L — ABNORMAL LOW (ref 135–145)

## 2010-10-29 LAB — CBC
MCHC: 35.7 g/dL (ref 30.0–36.0)
MCV: 89.1 fL (ref 78.0–100.0)
WBC: 8.6 10*3/uL (ref 4.0–10.5)

## 2010-10-30 DIAGNOSIS — R42 Dizziness and giddiness: Secondary | ICD-10-CM

## 2010-10-30 LAB — BASIC METABOLIC PANEL
CO2: 26 mEq/L (ref 19–32)
Calcium: 8.5 mg/dL (ref 8.4–10.5)
Creatinine, Ser: 0.61 mg/dL (ref 0.50–1.35)
GFR calc non Af Amer: >60 mL/min (ref >60)
Glucose, Bld: 100 mg/dL — ABNORMAL HIGH (ref 70–99)

## 2010-10-31 ENCOUNTER — Telehealth: Payer: Self-pay | Admitting: Cardiology

## 2010-10-31 DIAGNOSIS — I359 Nonrheumatic aortic valve disorder, unspecified: Secondary | ICD-10-CM

## 2010-10-31 DIAGNOSIS — I4891 Unspecified atrial fibrillation: Secondary | ICD-10-CM

## 2010-10-31 NOTE — Telephone Encounter (Signed)
[phone] ECHO

## 2010-11-01 LAB — BASIC METABOLIC PANEL
BUN: 11 mg/dL (ref 6–23)
CO2: 30 mEq/L (ref 19–32)
Calcium: 8.7 mg/dL (ref 8.4–10.5)
Creatinine, Ser: 0.64 mg/dL (ref 0.50–1.35)
Glucose, Bld: 103 mg/dL — ABNORMAL HIGH (ref 70–99)

## 2010-11-03 NOTE — Discharge Summary (Signed)
Aaron Lucas, BRICKLE NO.:  [id number]  MEDICAL RECORD NO.:  [id number]  LOCATION:  3711                         FACILITY:  [facility]  PHYSICIAN:  Gaspar Garbe, M.D.DATE OF BIRTH:  02/10/1929  DATE OF ADMISSION:  10/29/2010 DATE OF DISCHARGE:  11/01/2010                              DISCHARGE SUMMARY   MEDICATION ON DISCHARGE: 1. Pradaxa 150 mg p.o. b.i.d. 2. Meclizine 25 mg p.o. q.8 hours as needed. 3. Cleocin gel topically applied twice daily. 4. Oral clindamycin prior to dental work 4 tablets 150 mg as needed. 5. Lisinopril HCT 20/12.5 one tablet by mouth twice daily. 6. Metronidazole gel apply topically twice daily.  FINAL DIAGNOSES: 1. Paroxysmal atrial fibrillation, resolved. 2. Vertigo without evidence of stroke. 3. History of rib fractures with pleural effusions in 2010. 4. Recurrent transitional carcinoma of the bladder. 5. Prostate cancer with radioactive seed implant in 2004. 6. Colon polyps in 2011. 7. Aortic stenosis and mitral valve prolapse. 8. Squamous cell carcinoma removed back in 2011.  PHYSICAL EXAMINATION ON THE DATE OF DISCHARGE:  VITAL SIGNS: Temperature 97.6, pulse 61, respiratory rate 16, blood pressure 125/69, satting 94% on room air. GENERAL:  No acute distress. HEENT:  Normocephalic, atraumatic.  PERRLA.  EOMI.  ENT is within normal limits. NECK:  Supple.  No lymphadenopathy, JVD, or bruit. HEART:  Regular rate and rhythm. LUNGS:  Clear to auscultation bilaterally. ABDOMEN:  Soft, nontender.  Normoactive bowel sounds. EXTREMITIES:  No clubbing, cyanosis, or edema. NEUROLOGIC:  The patient has difficulty with ambulation due to dizziness, this was seen initially and remains stable to slightly improved at the time of discharge per his physical therapist evaluation today.  IMAGING:  The patient underwent MRI of his brain as well as CT of his brain on October 29, 2010, in the emergency room, not showing any acuity. The  patient underwent echocardiogram on October 31, 2010, which showed some diastolic changes, but a normal EF of 55-60%.  An MRI is difficult to assess due to shadowing from calcified valves as well as a calcified aortic valve with moderate aortic stenosis and a gradient of approximately 34.  The patient also underwent a carotid duplex study which showed mild heterogeneous plaque in his right proximal ICA, anterograde flow and minimal plaque in the left side.  LABORATORY TESTING ON THE DATE OF DISCHARGE:  The patient's BMET showed sodium slightly decreased at 134.  His BUN and creatinine are 11 and 0.6 respectively.  TSH was normal at 0.8.  Initial alcohol level was less than 10.  Urinalysis was within normal limits as well and his initial CBC showed a white count of 8.6, hemoglobin 12.9, hematocrit 36.1, platelets 125.  SUMMARY OF HOSPITAL COURSE:  Mr. Swamy was brought into the emergency room for dizziness which was thought to be stroke like in nature.  He had a CT and an MRI which were negative and the patient was evaluated by Physical Therapy and started on meclizine.  He did reasonably well and was going to be discharged on day 3 of hospitalization, but was found to be in atrial fibrillation.  He was able to convert out of this quite easily and was  evaluated by [facility] with the recommendation that he had outpatient Holter monitoring and was started on Pradaxa because of the atrial fibrillation over the evening time into the early of the day 4, he will remain on telemetry and had only 1 event, which was a 2-second pause which the patient did not feel or experience.  The patient was evaluated by Physical Therapy during this time and home physical therapy was indicated.  Of note, the patient's wife is my patient as well and between her and her son, they thought that they could handle him at home, however, the patient is not able to drive and is not able to go to physical  therapy due to other difficulties within the family.  The patient has been set up for home physical therapy and documentation has been provided for the [facility] regarding this.  He has been given a prescription for physical therapy as well as for a walker.  CONDITION ON DISCHARGE:  Stable.     Gaspar Garbe, M.D.     RWT/MEDQ  D:  11/01/2010  T:  11/01/2010  Job:  [id number]  cc:   Geoffry Paradise, MD  Electronically Signed by Guerry Bruin M.D. on 11/03/2010 01:43:43 PM

## 2010-11-05 NOTE — H&P (Signed)
NAMEOMEGA, SLAGER NO.:  [id number]  MEDICAL RECORD NO.:  [id number]  LOCATION:  3711                         FACILITY:  [facility]  PHYSICIAN:  Tera Mater. [place], M.D. DATE OF BIRTH:  April 11, 1929  DATE OF ADMISSION:  10/29/2010 DATE OF DISCHARGE:                             HISTORY & PHYSICAL   Aaron Lucas is a 77 year old white male with history of hypertension, aortic stenosis, prostate and bladder cancer as well as valvular heart disease who presents today for dizziness.  He was in his usual state of good health; and in fact, he had a physical just 2 days ago.  No particular findings were found at that time.  Now, he presents with acute onset of vertigo and unstable gait and nausea starting about midday.  He has had a full and thorough workup here in the emergency room with a negative MRI noted with no evidence of a stroke.  At the present time, however, he basically can ambulate.  He gets nauseous and vomits when even sitting up at the edge of the bed.  He does have the sense that the room is spinning.  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  He has had no specific weakness or numbness in any particular way.  He has no headache.  His vision is at its baseline.  His stomach feels fine when he is sitting still.  He has had no cardiac chest pain.  He has not been short of breath.  He has had no diarrhea with all of this.  He had noted a relatively normal physical just a couple of days ago.  At the present time, no other new findings are present.  PAST MEDICAL HISTORY:  Rib fractures with a pleural effusion back in 2010.  He has had recurrent transitional carcinoma of the bladder with a last cystoscopy back in 2006 it appears.  He has had prostate cancer with an I-125 seed implant back in 2004.  He has also had a couple of bladder procedures back in 2003.   His other history includes colon polyps back in August 2011.  These were tubular adenomas.  He has aortic stenosis and mitral valve prolapse.  He has squamous cell carcinomas removed off his skin back in 2011.  It looks like his first evidence of bladder cancer may have been as far back as 2002.  ALLERGIES:  CIPROFLOXACIN, DIAMOX, DOXYCYCLINE, ERYTHROMYCIN, AND PENICILLIN.  MEDICATIONS:  Hydrochlorothiazide, lisinopril, and Claritin.  He does also take clindamycin when needed for dental work.  SOCIAL HISTORY:  He is a nonsmoker, nondrinker.  He is retired.  PHYSICAL EXAMINATION:  VITAL SIGNS:  On exam here in the emergency room, blood pressure initially was 166/71 with a pulse of 77, respirations 14, temperature 97.6, O2 sats 96%.  He has a strip at the bedside showing unifocal PVCs with ectopy about every 8 beats that has now resolved and is not present on the monitor, we watched for a few minutes. GENERAL:  He is a fairly healthy and well-nourished appearing white male, lying quietly at  about 15 degrees in no distress. HEENT:  Sclerae anicteric.  His left eye has esotropia, and there is irregular pupil.  His right eye shows some lateral nystagmus just at rest with a fairly fast beat.  His face is symmetric.  His oral mucous membranes are moist.  Dentition is in poor repair. NECK:  Supple with a transmitted murmur on the right.  No thyromegaly is present. LUNGS:  Clear without wheezes, rales, or rhonchi.  He is moving air well.  No accessory muscle use. HEART:  Regular with a harsh aortic systolic murmur and a lesser mitral murmur. ABDOMEN:  Soft and nontender, a bit doughy with a small umbilical hernia. EXTREMITIES:  Strong distal pulses with no peripheral edema.  Grip is equal bilaterally. NEURO:  He is alert.  He is awake.  He is mentating well.  Speech is clear and fluent.  There is no resting tremor.  I did not attempt to walk him as the ER doc had just tried that, and he  has had vomiting. SKIN:  Warm and dry with no rashes.  LABORATORY DATA:  Alcohol level was unremarkably low.  His ketones are 15 mg percent.  Troponin was less than 0.3.  His sodium was 132, potassium 3.2, chloride 97, CO2 of 25, BUN 11, creatinine 0.60, glucose of 146.  Estimated GFR greater than 16.  Calcium of 8.  White count was 8600, hemoglobin was 12.9, MCV 89.1, and platelets of 125,000.  In summary, we have a 77 year old white male presenting with intractable vertigo.  He has been given some hydration and is still not able to get over this.  I should note also the CT showed normal CT with no intracranial findings.  An MRI shows no significant finding as well. This did show this is probably not a primary neurologic event but more of a vestibular event.  I think with his age and risk of falling and hurting himself, inpatient observation is necessary.  Due to the fact that he has valvular heart disease and some ectopy, however, I want to put him on a monitor.  We will gently hydrate him and replace his potassium and see if we cannot get his electrolytes in better order.  We will give him p.o. diazepam, which found to work fairly well in this condition as well as meclizine.  We will continue his lisinopril.  He will get some Lovenox subcu for DVT prophylaxis.  He has a full code status.  There is no evidence of this being related to any of his malignancies.          ______________________________ Tera Mater Evlyn Kanner, M.D.     SAS/MEDQ  D:  10/29/2010  T:  10/30/2010  Job:  [id number]  Electronically Signed by Adrian Prince M.D. on 11/05/2010 02:06:11 PM

## 2010-11-07 ENCOUNTER — Encounter (INDEPENDENT_AMBULATORY_CARE_PROVIDER_SITE_OTHER): Payer: Medicare Other

## 2010-11-07 DIAGNOSIS — I4891 Unspecified atrial fibrillation: Secondary | ICD-10-CM

## 2010-11-14 ENCOUNTER — Telehealth: Payer: Self-pay | Admitting: Cardiology

## 2010-11-14 NOTE — Telephone Encounter (Signed)
Pt called back and can be reached until 3p or after 430p

## 2010-11-14 NOTE — Telephone Encounter (Signed)
Pt requesting results from heart monitor

## 2010-11-17 ENCOUNTER — Telehealth: Payer: Self-pay | Admitting: Cardiology

## 2010-11-17 NOTE — Telephone Encounter (Signed)
Per pt call, pt wanting to know reading of pt holter monitor. Pt was also told someone would be in contact with him to inform him of readings and dates of follow up appt. Please return pt call to advise/discuss.

## 2010-11-24 ENCOUNTER — Telehealth: Payer: Self-pay | Admitting: Cardiology

## 2010-11-24 NOTE — Telephone Encounter (Signed)
Pt calling to get results of heart monitor from 2 weeks ago

## 2010-11-24 NOTE — Telephone Encounter (Signed)
According to patient, he was seen in the hospital by Dr. Antoine Poche. MD order a 48 hours Holter monitor 2 weeks ago. His last primary Cardiologist was Dr. Deborah Chalk.  He is calling for results for the 2 Nd time . Marcos Eke had send results to Dr. Antoine Poche. Pt. Aware that we will call him with the results .

## 2010-11-24 NOTE — Consult Note (Signed)
NAMEMarland Kitchen  Aaron, Lucas NO.:  [id number]  MEDICAL RECORD NO.:  [id number]  LOCATION:  3711                         FACILITY:  [facility]  PHYSICIAN:  Rollene Rotunda, MD, FACCDATE OF BIRTH:  [birth year]  DATE OF CONSULTATION:  10/31/2010 DATE OF DISCHARGE:                                CONSULTATION   PRIMARY CARE PHYSICIAN:  Aaron Paradise, MD  PRIMARY CARDIOLOGIST:  Aaron Can. Deborah Chalk, MD  CHIEF COMPLAINT:  Atrial fib.  HISTORY OF PRESENT ILLNESS:  Aaron Lucas is a 77 year old male with a history of valvular heart disease.  He was admitted with vertigo on October 29, 2010.  He had significant dizziness as well as a balance problem and nausea, but no vomiting or diarrhea.  Since being in the hospital and being treated with IV fluids and p.r.n. medications, his symptoms have improved.  His initial EKG was sinus rhythm.  Today, Aaron Lucas was noted to be in an irregular rhythm.  An EKG shows atrial fibrillation. The atrial fibrillation lasted approximately 2 hours.  He went back into sinus rhythm spontaneously.  His heart rate did not change significantly when he was in AFib.  Aaron Lucas denies chest pain, shortness of breath, or palpitations.  He had no dizziness or nausea associated with the arrhythmia.  He had no presyncope or syncope.  He felt a little tired today and that has improved, but he also states he has not been sleeping well since being in the hospital and thought that was the reason for it.  Currently, he is resting comfortably.  Of note, he has never had palpitations.  PAST MEDICAL HISTORY: 1. Hypertension. 2. History of prostate cancer. 3. History of cataracts. 4. History of aortic stenosis and mitral valve prolapse with no     echocardiogram available at this time. 5. History of a fall while on vacation in [place] in 2010 with     subsequent pleural effusion, treated with thoracentesis.  SURGICAL HISTORY:  He is status post thoracentesis and  prostate seed implantation.  ALLERGIES: 1. CIPRO. 2. PENICILLIN. 3. DIAMOX. 4. VIBRAMYCIN. 5. METHAZOLAMIDE. 6. NEPTAZANE.  CURRENT MEDICATIONS: 1. DVT Lovenox. 2. Lisinopril/HCTZ 20/12.5 daily. 3. Meclizine 25 mg q.8 h.  SOCIAL HISTORY:  He lives in [place], [place] with his wife. He is retired.  He has no history of alcohol, tobacco or drug abuse.  He works out on an elliptical 3 times a week for about 30-35 minutes and feels he eats a heart healthy diet.  FAMILY HISTORY:  His mother died at age 40 and his father died in his 91s of a PE after surgery, but there is no known coronary artery disease in either of his parents or any siblings.  REVIEW OF SYSTEMS:  He has vision loss and wears glasses.  He has had some weakness as well as balance difficulties and nausea.  He has had significant dizziness.  The symptoms have improved with the exception of the balance difficulties which linger.  He denies chest pain or shortness of breath.  He has no history of dyspnea on exertion or significant edema.  He has no history of syncope or presyncope.  Full 14- review of  systems is otherwise negative except as stated in the HPI.  PHYSICAL EXAMINATION:  VITAL SIGNS:  Temperature is 98.4, blood pressure 138/75, heart rate 76, respiratory rate 20, O2 saturation 95% on room air. GENERAL:  He is a well-developed elderly white male in no acute distress. HEENT:  Normal with the exception of disconjugate gaze. NECK:  There is no lymphadenopathy, thyromegaly, or JVD noted, but his murmur radiates to both carotids. CV:  His heart is regular in rate and rhythm with an S1 and S2, and a 2- 3/6 murmur is noted at the left upper sternal border.  The S2 is heard clearly.  Distal pulses are intact in all 4 extremities. LUNGS:  He has a few rales on the right, but generally clear. SKIN:  No rashes or lesions are noted. ABDOMEN:  Soft and nontender with active bowel sounds. EXTREMITIES:   There is no cyanosis, clubbing, or edema noted. MUSCULOSKELETAL:  There is no joint deformity or effusions and no spine or CVA tenderness. NEURO:  He is alert and oriented with cranial nerves II-XII grossly intact.  CT the head showed no acute.  MRI of the head showed no acute.  EKG, initially sinus rhythm with no acute changes, today was atrial fibrillation, rate 78 with no acute ischemic changes.  LABORATORY VALUES:  Hemoglobin 12.9, hematocrit 36.1, WBCs 8.6, platelets 125.  Sodium 135, potassium 3.5, chloride 97, CO2 of 26, BUN 9, creatinine 0.61, glucose 100.  Troponin-I negative x1.  IMPRESSION:  Aaron Lucas was seen today by Aaron Lucas, the patient evaluated and the data reviewed.  He is a 77 year old male with no prior atrial fibrillation history.  He has a history of aortic stenosis and left ventricular hypertrophy.  He was admitted with vertigo and sinus rhythm, and went into atrial fibrillation today.  He does not feel palpitations and had no presyncope or syncope.  There was no shortness of breath or chest pain.  1. Atrial fibrillation:  He is now in sinus rhythm.  A long discussion     was had with the patient and his wife about the atrial     fibrillation.  Given his advanced age and hypertension, we suggest     long-term anticoagulation.  We     discussed the risks and benefits.  The plan is to add Pradaxa to     his medication regimen.  We will get an outpatient 48-hour Holter     monitor. 2. Aortic stenosis:  We will check for echocardiogram results with Dr.     Ronnald Lucas office.     Aaron Demark, PA-C   ______________________________ Rollene Rotunda, MD, [facility]    RB/MEDQ  D:  10/31/2010  T:  11/01/2010  Job:  [id number]  Electronically Signed by Aaron Demark PA-C on 11/13/2010 06:49:12 AM Electronically Signed by Rollene Rotunda MD [facility] on 11/24/2010 01:25:36 PM

## 2010-11-28 NOTE — Telephone Encounter (Signed)
Dr Antoine Poche has results and will read them.  He will call Dr Jacky Kindle.

## 2010-12-02 ENCOUNTER — Encounter: Payer: Self-pay | Admitting: Cardiology

## 2010-12-04 ENCOUNTER — Encounter: Payer: Self-pay | Admitting: Cardiology

## 2010-12-04 ENCOUNTER — Ambulatory Visit (INDEPENDENT_AMBULATORY_CARE_PROVIDER_SITE_OTHER): Payer: Medicare Other | Admitting: Cardiology

## 2010-12-04 DIAGNOSIS — I340 Nonrheumatic mitral (valve) insufficiency: Secondary | ICD-10-CM

## 2010-12-04 DIAGNOSIS — I059 Rheumatic mitral valve disease, unspecified: Secondary | ICD-10-CM

## 2010-12-04 DIAGNOSIS — I10 Essential (primary) hypertension: Secondary | ICD-10-CM | POA: Insufficient documentation

## 2010-12-04 DIAGNOSIS — I4891 Unspecified atrial fibrillation: Secondary | ICD-10-CM

## 2010-12-04 DIAGNOSIS — I480 Paroxysmal atrial fibrillation: Secondary | ICD-10-CM | POA: Insufficient documentation

## 2010-12-04 NOTE — Progress Notes (Signed)
HPI The patient presents for follow of MR and atrial fibrillation.  He was in the hospital for dizziness and vertigo.  He has had therapy for interview her dysfunction and is currently undergoing physical therapy. In the hospital he was found to have paroxysmal atrial fibrillation. Echo confirmed moderate mitral regurgitation. Neuro workup including MRI and carotid Doppler's demonstrated no acute abnormalities. As an outpatient he wore a Holter monitor which demonstrated no atrial fibrillation. He does not feel any palpitations. He's had no presyncope or syncope and his dizziness is improved. He wants to get back to exercising. He denies any shortness of breath, PND or orthopnea. He has had no chest pressure, neck or arm discomfort. He has had no weight gain or edema.  Allergies  Allergen Reactions  . Ciprofloxacin     REACTION: rash  . Doxycycline     REACTION: rash  . Erythromycin     REACTION: rash  . Penicillins     REACTION: rash/swelling    Current Outpatient Prescriptions  Medication Sig Dispense Refill  . clindamycin (CLEOCIN) 150 MG capsule prior to dental work 4 tablets 150 mg as needed.       . clindamycin (CLEOCIN-T) 1 % gel Apply 1 application topically 2 (two) times daily.        . dabigatran (PRADAXA) 150 MG CAPS Take 150 mg by mouth every 12 (twelve) hours.        Marland Kitchen lisinopril-hydrochlorothiazide (PRINZIDE,ZESTORETIC) 20-12.5 MG per tablet Take 1 tablet by mouth 2 (two) times daily.        . meclizine (ANTIVERT) 25 MG tablet Take 25 mg by mouth 3 (three) times daily as needed.        . metroNIDAZOLE (METROGEL) 1 % gel Apply 1 application topically 2 (two) times daily.          Past Medical History  Diagnosis Date  . Hypertension   . Cataracts, bilateral   . Valvular heart disease   . MVP (mitral valve prolapse)   . Aortic stenosis   . Paroxysmal atrial fibrillation   . Vertigo   . Transitional cell carcinoma of bladder     recurrent  . Prostate cancer   . Hx of  colonic polyps     Past Surgical History  Procedure Date  . Pleural effusion drainage 2010  . Radioactive seed implant   . Squamous cell carcinoma excision   . Thoracentesis     ROS:  As stated in the HPI and negative for all other systems.  PHYSICAL EXAM BP 122/70  Pulse 80  Resp 16  Ht 5\' 10"  (1.778 m)  Wt 201 lb (91.173 kg)  BMI 28.84 kg/m2 GENERAL:  Well appearing HEENT:  Fundi not visualized, oral mucosa unremarkable, disconjugate gaze with anisocoria.   NECK:  No jugular venous distention, waveform within normal limits, carotid upstroke brisk and symmetric, no bruits, no thyromegaly LYMPHATICS:  No cervical, inguinal adenopathy LUNGS:  Clear to auscultation bilaterally BACK:  No CVA tenderness CHEST:  Unremarkable HEART:  PMI not displaced or sustained,S1 and S2 within normal limits, no S3, no S4, no clicks, no rubs, apical holosystolic murmur and murmur radiating out the aortic outflow tract. ABD:  Flat, positive bowel sounds normal in frequency in pitch, no bruits, no rebound, no guarding, no midline pulsatile mass, no hepatomegaly, no splenomegaly EXT:  2 plus pulses throughout, no edema, no cyanosis no clubbing SKIN:  No rashes no nodules NEURO:  Cranial nerves II through XII grossly intact, motor grossly  intact throughout PSYCH:  Cognitively intact, oriented to person place and time  ASSESSMENT AND PLAN

## 2010-12-04 NOTE — Assessment & Plan Note (Signed)
The blood pressure is at target. No change in medications is indicated. We will continue with therapeutic lifestyle changes (TLC).  

## 2010-12-04 NOTE — Patient Instructions (Signed)
Follow up in 6 months with Dr Antoine Poche.  You will receive a letter in the mail 2 months before you are due.  Please call us when you receive this letter to schedule your follow up appointment.  Your physician has requested that you have an echocardiogram in 1 year. Echocardiography is a painless test that uses sound waves to create images of your heart. It provides your doctor with information about the size and shape of your heart and how well your heart's chambers and valves are working. This procedure takes approximately one hour. There are no restrictions for this procedure.  The current medical regimen is effective;  continue present plan and medications.

## 2010-12-04 NOTE — Assessment & Plan Note (Signed)
He tolerates this rhythm and rate control and anticoagulation. We will continue with the meds as listed.  

## 2010-12-04 NOTE — Assessment & Plan Note (Signed)
I will follow this up with an echo in one year.  I will see him again in six months.

## 2011-01-28 ENCOUNTER — Encounter (HOSPITAL_BASED_OUTPATIENT_CLINIC_OR_DEPARTMENT_OTHER): Payer: Self-pay | Admitting: *Deleted

## 2011-01-28 ENCOUNTER — Emergency Department (INDEPENDENT_AMBULATORY_CARE_PROVIDER_SITE_OTHER): Payer: Medicare Other

## 2011-01-28 ENCOUNTER — Emergency Department (HOSPITAL_BASED_OUTPATIENT_CLINIC_OR_DEPARTMENT_OTHER)
Admission: EM | Admit: 2011-01-28 | Discharge: 2011-01-28 | Disposition: A | Discharge status: Home / Self Care | Payer: Medicare Other | Attending: Emergency Medicine | Admitting: Emergency Medicine

## 2011-01-28 DIAGNOSIS — W1809XA Striking against other object with subsequent fall, initial encounter: Secondary | ICD-10-CM | POA: Insufficient documentation

## 2011-01-28 DIAGNOSIS — W19XXXA Unspecified fall, initial encounter: Secondary | ICD-10-CM

## 2011-01-28 DIAGNOSIS — S0100XA Unspecified open wound of scalp, initial encounter: Secondary | ICD-10-CM | POA: Insufficient documentation

## 2011-01-28 DIAGNOSIS — S0101XA Laceration without foreign body of scalp, initial encounter: Secondary | ICD-10-CM

## 2011-01-28 DIAGNOSIS — S43401A Unspecified sprain of right shoulder joint, initial encounter: Secondary | ICD-10-CM

## 2011-01-28 DIAGNOSIS — Y92009 Unspecified place in unspecified non-institutional (private) residence as the place of occurrence of the external cause: Secondary | ICD-10-CM | POA: Insufficient documentation

## 2011-01-28 DIAGNOSIS — S0003XA Contusion of scalp, initial encounter: Secondary | ICD-10-CM | POA: Insufficient documentation

## 2011-01-28 DIAGNOSIS — I4891 Unspecified atrial fibrillation: Secondary | ICD-10-CM | POA: Insufficient documentation

## 2011-01-28 DIAGNOSIS — M79609 Pain in unspecified limb: Secondary | ICD-10-CM

## 2011-01-28 DIAGNOSIS — I10 Essential (primary) hypertension: Secondary | ICD-10-CM | POA: Insufficient documentation

## 2011-01-28 DIAGNOSIS — Z79899 Other long term (current) drug therapy: Secondary | ICD-10-CM | POA: Insufficient documentation

## 2011-01-28 DIAGNOSIS — M25519 Pain in unspecified shoulder: Secondary | ICD-10-CM

## 2011-01-28 DIAGNOSIS — S0990XA Unspecified injury of head, initial encounter: Secondary | ICD-10-CM

## 2011-01-28 MED ORDER — TETANUS-DIPHTHERIA TOXOIDS TD 5-2 LFU IM INJ
0.5000 mL | INJECTION | Freq: Once | INTRAMUSCULAR | Status: AC
  Administered 2011-01-28: 0.5 mL via INTRAMUSCULAR
  Filled 2011-01-28: qty 0.5

## 2011-01-28 MED ORDER — LIDOCAINE-EPINEPHRINE 2 %-1:100000 IJ SOLN
1.7000 mL | Freq: Once | INTRAMUSCULAR | Status: AC
  Administered 2011-01-28: 1 mL
  Filled 2011-01-28: qty 1

## 2011-01-28 NOTE — ED Notes (Signed)
Pt c/o fall and hitting head on brink fireplace, and right upper arm pain

## 2011-01-28 NOTE — ED Notes (Signed)
MD at bedside. 

## 2011-01-28 NOTE — ED Notes (Signed)
Pt returned from radiology.

## 2011-01-28 NOTE — ED Notes (Signed)
Pt transported to radiology.

## 2011-01-28 NOTE — ED Notes (Signed)
Pt tolerated procedure (2 staples)  Awaiting dispo.

## 2011-01-28 NOTE — ED Provider Notes (Addendum)
History     CSN: [id number] Arrival date & time: 01/28/2011  3:39 PM   First MD Initiated Contact with Patient 01/28/11 1540      Chief Complaint  Patient presents with  . Fall  . Head Laceration    (Consider location/radiation/quality/duration/timing/severity/associated sxs/prior treatment) HPI Comments: The patient has a history of vertigo and equilibrium problems and was at home doing exercises to treat this condition when he lost his balance and fell backwards to his right side, onto his right shoulder, and hitting the right occiput on the hearth of the fireplace, sustaining a small laceration to the right occipital scalp. He denies any loss of consciousness, nausea, vomiting, vision change, change in his hearing, tinnitus, neck pain, back pain, headache, or pain elsewhere other than his right upper arm. His right upper arm is painful with movement and palpation only but is not painful when the right shoulder and right upper extremity are mobilized. The patient is in no apparent distress, and is awake, alert, and oriented x4. He does take pradaxa and had continued bleeding from his scalp laceration which was his major concern, along with his right upper extremity pain.  The patient has an approximately 1 cm linear vertical laceration to the right occipital scalp.  Patient is a 77 y.o. male presenting with fall and scalp laceration. The history is provided by the patient and the spouse.  Fall The accident occurred less than 1 hour ago. Incident: While exercising  He fell from a height of 1 to 2 ft. He landed on a hard floor. The volume of blood lost was minimal. The point of impact was the head and right shoulder. The pain is present in the right shoulder. The pain is moderate. He was ambulatory at the scene. There was no entrapment after the fall. There was no drug use involved in the accident. There was no alcohol use involved in the accident. Pertinent negatives include no visual change, no  numbness, no abdominal pain, no bowel incontinence, no nausea, no vomiting, no hematuria, no headaches, no hearing loss, no loss of consciousness and no tingling. The symptoms are aggravated by use of the injured limb and pressure on the injury. He has tried acetaminophen for the symptoms. The treatment provided moderate relief.  Head Laceration This is a new problem. The current episode started less than 1 hour ago. The problem has been resolved. Pertinent negatives include no chest pain, no abdominal pain, no headaches and no shortness of breath. The symptoms are aggravated by nothing.    Past Medical History  Diagnosis Date  . Hypertension   . Cataracts, bilateral   . Valvular heart disease   . Mitral regurgitation   . Aortic stenosis   . Paroxysmal atrial fibrillation   . Vertigo   . Transitional cell carcinoma of bladder     recurrent  . Prostate cancer   . Hx of colonic polyps   . Dizziness     Past Surgical History  Procedure Date  . Pleural effusion drainage 2010  . Radioactive seed implant   . Squamous cell carcinoma excision   . Thoracentesis     Family History  Problem Relation Age of Onset  . Other Father 46     of a PE after surgery  . Other       there is no known coronary artery disease     History  Substance Use Topics  . Smoking status: Never Smoker   . Smokeless tobacco: Not on  file  . Alcohol Use: No      Review of Systems  Constitutional: Negative for diaphoresis.  HENT: Negative for hearing loss, ear pain, nosebleeds, congestion, facial swelling, rhinorrhea, neck pain, neck stiffness, dental problem, voice change, postnasal drip, tinnitus and ear discharge.   Eyes: Negative for photophobia, pain, redness and visual disturbance.  Respiratory: Negative for cough, choking, shortness of breath, wheezing and stridor.   Cardiovascular: Negative for chest pain.  Gastrointestinal: Negative for nausea, vomiting, abdominal pain, abdominal distention and  bowel incontinence.  Genitourinary: Negative for hematuria, flank pain, decreased urine volume and difficulty urinating.  Musculoskeletal: Negative for myalgias, back pain, joint swelling and arthralgias.  Skin: Positive for wound. Negative for color change and pallor.  Neurological: Negative for dizziness, tingling, tremors, loss of consciousness, weakness, light-headedness, numbness and headaches.  Hematological: Does not bruise/bleed easily.  Psychiatric/Behavioral: Negative for confusion and agitation.    Allergies  Diamox; Neptazane; Ciprofloxacin; Doxycycline; Erythromycin; and Penicillins  Home Medications   Current Outpatient Rx  Name Route Sig Dispense Refill  . ACETAMINOPHEN 500 MG PO CHEW Oral Chew 1,000 mg by mouth once.      Marland Kitchen CLINDAMYCIN PHOSPHATE 1 % EX GEL Topical Apply 1 application topically 2 (two) times daily.      Marland Kitchen DABIGATRAN ETEXILATE MESYLATE 150 MG PO CAPS Oral Take 150 mg by mouth every 12 (twelve) hours.      Marland Kitchen LISINOPRIL-HYDROCHLOROTHIAZIDE 20-12.5 MG PO TABS Oral Take 1 tablet by mouth 2 (two) times daily.      Marland Kitchen METRONIDAZOLE 1 % EX GEL Topical Apply 1 application topically 2 (two) times daily.      Marland Kitchen CLINDAMYCIN HCL 150 MG PO CAPS  prior to dental work 4 tablets 150 mg as needed.     Marland Kitchen MECLIZINE HCL 25 MG PO TABS Oral Take 25 mg by mouth 3 (three) times daily as needed. For vertigo      BP 160/66  Pulse 71  Temp(Src) 97.8 F (36.6 C) (Oral)  Resp 16  Ht 5\' 10"  (1.778 m)  Wt 198 lb (89.812 kg)  BMI 28.41 kg/m2  SpO2 99%  Physical Exam  Nursing note and vitals reviewed. Constitutional: He is oriented to person, place, and time. He appears well-developed and well-nourished. He is cooperative. He does not have a sickly appearance. He appears distressed.  HENT:  Head: Normocephalic. Head is with laceration. Head is without raccoon's eyes, without Battle's sign, without abrasion and without contusion.    Right Ear: Hearing, tympanic membrane,  external ear and ear canal normal.  Left Ear: Hearing, tympanic membrane, external ear and ear canal normal.  Nose: Nose normal. No mucosal edema, rhinorrhea, nose lacerations, sinus tenderness, nasal deformity, septal deviation or nasal septal hematoma. No epistaxis. Right sinus exhibits no maxillary sinus tenderness and no frontal sinus tenderness. Left sinus exhibits no maxillary sinus tenderness and no frontal sinus tenderness.  Mouth/Throat: Uvula is midline, oropharynx is clear and moist and mucous membranes are normal. Normal dentition.  Eyes: Conjunctivae and EOM are normal. Pupils are equal, round, and reactive to light.  Neck: Trachea normal, normal range of motion, full passive range of motion without pain and phonation normal. Neck supple. No JVD present. No spinous process tenderness and no muscular tenderness present. No tracheal deviation and normal range of motion present.  Cardiovascular: Normal rate, regular rhythm, intact distal pulses and normal pulses.   No extrasystoles are present.  Pulses:      Radial pulses are 2+ on the  right side, and 2+ on the left side.       Dorsalis pedis pulses are 2+ on the right side, and 2+ on the left side.       Posterior tibial pulses are 2+ on the right side, and 2+ on the left side.  Pulmonary/Chest: Effort normal and breath sounds normal. No accessory muscle usage or stridor. Not tachypneic. No respiratory distress. He has no decreased breath sounds. He has no wheezes. He has no rhonchi. He has no rales. He exhibits no tenderness, no bony tenderness, no crepitus, no deformity and no retraction.  Abdominal: Soft. Normal appearance and bowel sounds are normal. He exhibits no distension and no pulsatile midline mass. There is no hepatosplenomegaly. There is no tenderness. There is no rebound, no guarding and no CVA tenderness.  Musculoskeletal: Normal range of motion. He exhibits no edema and no tenderness.       Cervical back: Normal. He exhibits  no tenderness, no bony tenderness, no deformity, no pain and no spasm.       Thoracic back: He exhibits no tenderness, no bony tenderness, no deformity, no pain and no spasm.       Lumbar back: He exhibits no tenderness, no bony tenderness, no deformity, no pain and no spasm.       Right upper arm: He exhibits tenderness. He exhibits no swelling, no edema, no deformity and no laceration.       Arms: Neurological: He is alert and oriented to person, place, and time. He has normal strength. No cranial nerve deficit or sensory deficit. He exhibits normal muscle tone. GCS eye subscore is 4. GCS verbal subscore is 5. GCS motor subscore is 6.  Reflex Scores:      Brachioradialis reflexes are 2+ on the right side and 2+ on the left side.      Patellar reflexes are 2+ on the right side and 2+ on the left side. Skin: Skin is warm, dry and intact. No bruising, no ecchymosis, no laceration and no lesion noted. He is not diaphoretic. No erythema. No pallor.  Psychiatric: He has a normal mood and affect. His speech is normal and behavior is normal. Judgment and thought content normal. Cognition and memory are normal.    ED Course  LACERATION REPAIR Date/Time: 01/28/2011 4:22 PM Performed by: Kennon Rounds D Authorized by: Kennon Rounds D Consent: Verbal consent obtained. Written consent not obtained. Risks and benefits: risks, benefits and alternatives were discussed Consent given by: patient Patient understanding: patient states understanding of the procedure being performed Patient consent: the patient's understanding of the procedure matches consent given Imaging studies: imaging studies available Required items: required blood products, implants, devices, and special equipment available Patient identity confirmed: verbally with patient and arm band Time out: Immediately prior to procedure a "time out" was called to verify the correct patient, procedure, equipment, support staff and site/side  marked as required. Body area: head/neck Location details: scalp Laceration length: 1 cm Foreign bodies: no foreign bodies Tendon involvement: none Nerve involvement: none Vascular damage: no Anesthesia: local infiltration Local anesthetic: lidocaine 2% with epinephrine Anesthetic total: 2 ml Patient sedated: no Preparation: Patient was prepped and draped in the usual sterile fashion. Irrigation solution: saline Irrigation method: syringe Amount of cleaning: extensive Debridement: none Skin closure: staples Number of sutures: 2 Approximation: close Approximation difficulty: simple Dressing: antibiotic ointment Patient tolerance: Patient tolerated the procedure well with no immediate complications.   (including critical care time)  Labs Reviewed - No data  to display No results found. Dg Shoulder Right  01/28/2011  *RADIOLOGY REPORT*  Clinical Data: Right shoulder and arm pain post fall  RIGHT SHOULDER - 2+ VIEW  Comparison: None  Findings: Diffuse osseous demineralization. AC joint degenerative changes with spur formation, particulate cranially. Large calcified ossicle adjacent to cranial margin of right AC joint. Additional large inferior acromial spur. Glenohumeral degenerative changes also present. No acute fracture, dislocation, or bone destruction. Visualized right ribs intact.  IMPRESSION: Glenohumeral and acromioclavicular joint degenerative changes. Osseous demineralization. Inferior acromial spur formation, which predisposes the patient to rotator cuff disease. No acute bony abnormalities.  Original Report Authenticated By: Lollie Marrow, M.D.   Ct Head Wo Contrast  01/28/2011  *RADIOLOGY REPORT*  Clinical Data: Fall.  CT HEAD WITHOUT CONTRAST  Technique:  Contiguous axial images were obtained from the base of the skull through the vertex without contrast.  Comparison: 10/29/2010.  Findings: Right parietal scalp hematoma without underlying fracture or intracranial  hemorrhage.  Mild mucosal thickening right maxillary sinus.  No CT evidence of large acute infarct.  Small acute infarct cannot be excluded by CT.  Small vessel disease type changes.  No intracranial mass lesion detected on this unenhanced exam.  Vascular calcifications.  Postsurgical changes left globe.  IMPRESSION: Right parietal scalp hematoma without underlying fracture or intracranial hemorrhage.  Please see above.  Original Report Authenticated By: Fuller Canada, M.D.   Dg Humerus Right  01/28/2011  *RADIOLOGY REPORT*  Clinical Data: Right shoulder and arm pain post fall  RIGHT HUMERUS - 2+ VIEW  Comparison: None  Findings: Diffuse osseous demineralization. Mild glenohumeral degenerative changes. Elbow joint alignment normal. No acute fracture, dislocation, or bone destruction.  IMPRESSION: No acute osseous abnormalities.  Original Report Authenticated By: Lollie Marrow, M.D.    No diagnosis found.    MDM  Minor head injury, scalp laceration, calvarial fracture, major intracranial injury, intracranial hemorrhage, contusion of the right shoulder, fracture of the right humerus are all entertained in the patient's differential diagnosis.        Felisa Bonier, MD 01/28/11 1625  Felisa Bonier, MD 01/28/11 1700

## 2011-02-07 ENCOUNTER — Encounter (HOSPITAL_BASED_OUTPATIENT_CLINIC_OR_DEPARTMENT_OTHER): Payer: Self-pay | Admitting: *Deleted

## 2011-02-07 ENCOUNTER — Emergency Department (HOSPITAL_BASED_OUTPATIENT_CLINIC_OR_DEPARTMENT_OTHER)
Admission: EM | Admit: 2011-02-07 | Discharge: 2011-02-07 | Disposition: A | Discharge status: Home / Self Care | Payer: Medicare Other | Attending: Emergency Medicine | Admitting: Emergency Medicine

## 2011-02-07 DIAGNOSIS — Z4802 Encounter for removal of sutures: Secondary | ICD-10-CM

## 2011-02-07 NOTE — ED Notes (Signed)
Pt here for staple removal. No complaints.

## 2011-02-07 NOTE — ED Provider Notes (Signed)
History     CSN: [id number] Arrival date & time: 02/07/2011 11:28 AM   First MD Initiated Contact with Patient 02/07/11 1204      Chief Complaint  Patient presents with  . Suture / Staple Removal    (Consider location/radiation/quality/duration/timing/severity/associated sxs/prior treatment) Patient is a 77 y.o. male presenting with suture removal. The history is provided by the patient. No language interpreter was used.  Suture / Staple Removal  The sutures were placed 7 to 10 days ago. There has been no treatment since the wound repair. There has been no drainage from the wound. There is no redness present. There is no swelling present. The pain has no pain.    Past Medical History  Diagnosis Date  . Hypertension   . Cataracts, bilateral   . Valvular heart disease   . Mitral regurgitation   . Aortic stenosis   . Paroxysmal atrial fibrillation   . Vertigo   . Transitional cell carcinoma of bladder     recurrent  . Prostate cancer   . Hx of colonic polyps   . Dizziness     Past Surgical History  Procedure Date  . Pleural effusion drainage 2010  . Radioactive seed implant   . Squamous cell carcinoma excision   . Thoracentesis     Family History  Problem Relation Age of Onset  . Other Father 53     of a PE after surgery  . Other       there is no known coronary artery disease     History  Substance Use Topics  . Smoking status: Never Smoker   . Smokeless tobacco: Not on file  . Alcohol Use: No      Review of Systems  Skin: Positive for wound.    Allergies  Diamox; Neptazane; Ciprofloxacin; Doxycycline; Erythromycin; and Penicillins  Home Medications   Current Outpatient Rx  Name Route Sig Dispense Refill  . ACETAMINOPHEN 500 MG PO CHEW Oral Chew 1,000 mg by mouth once.      Marland Kitchen CLINDAMYCIN HCL 150 MG PO CAPS  prior to dental work 4 tablets 150 mg as needed.     Marland Kitchen CLINDAMYCIN PHOSPHATE 1 % EX GEL Topical Apply 1 application topically 2 (two) times  daily.      Marland Kitchen DABIGATRAN ETEXILATE MESYLATE 150 MG PO CAPS Oral Take 150 mg by mouth every 12 (twelve) hours.      Marland Kitchen LISINOPRIL-HYDROCHLOROTHIAZIDE 20-12.5 MG PO TABS Oral Take 1 tablet by mouth 2 (two) times daily.      Marland Kitchen MECLIZINE HCL 25 MG PO TABS Oral Take 25 mg by mouth 3 (three) times daily as needed. For vertigo    . METRONIDAZOLE 1 % EX GEL Topical Apply 1 application topically 2 (two) times daily.        BP 133/70  Pulse 76  Temp(Src) 98.5 F (36.9 C) (Oral)  Resp 20  Ht 5\' 10"  (1.778 m)  Wt 197 lb (89.359 kg)  BMI 28.27 kg/m2  SpO2 97%  Physical Exam  Nursing note and vitals reviewed. Constitutional: He is oriented to person, place, and time. He appears well-developed and well-nourished.  HENT:  Head: Normocephalic. Head is without raccoon's eyes, without Battle's sign and without laceration.    Eyes: EOM are normal.  Neck: Normal range of motion.  Cardiovascular: Normal rate, regular rhythm, normal heart sounds and intact distal pulses.   Pulmonary/Chest: Effort normal and breath sounds normal. No respiratory distress.  Abdominal: Soft. He exhibits no distension.  There is no tenderness.  Musculoskeletal: Normal range of motion.  Neurological: He is alert and oriented to person, place, and time.  Skin: Skin is warm and dry.  Psychiatric: He has a normal mood and affect. Judgment normal.    ED Course  Procedures (including critical care time)  Labs Reviewed - No data to display No results found.   No diagnosis found.    MDM          Worthy Rancher, PA 02/07/11 1304

## 2011-02-08 NOTE — ED Provider Notes (Signed)
Medical screening examination/treatment/procedure(s) were performed by non-physician practitioner and as supervising physician I was immediately available for consultation/collaboration.   Kimiko Common W. Johari Bennetts, MD 02/08/11 0819 

## 2011-06-08 ENCOUNTER — Encounter: Payer: Self-pay | Admitting: Cardiology

## 2011-06-08 ENCOUNTER — Ambulatory Visit (INDEPENDENT_AMBULATORY_CARE_PROVIDER_SITE_OTHER): Payer: Medicare Other | Admitting: Cardiology

## 2011-06-08 DIAGNOSIS — I480 Paroxysmal atrial fibrillation: Secondary | ICD-10-CM

## 2011-06-08 DIAGNOSIS — I340 Nonrheumatic mitral (valve) insufficiency: Secondary | ICD-10-CM

## 2011-06-08 DIAGNOSIS — I059 Rheumatic mitral valve disease, unspecified: Secondary | ICD-10-CM

## 2011-06-08 DIAGNOSIS — I4891 Unspecified atrial fibrillation: Secondary | ICD-10-CM

## 2011-06-08 NOTE — Assessment & Plan Note (Signed)
He hasn't had any symptomatic paroxysms of this that he knows of. He's had no problems with Pradaxa.  I did tell him that if he has any episodes of weakness he should try to go to an urgent care or call here for a nurse visit to get an EKG to see if we could correlate any symptoms with atrial fibrillation. Otherwise he will continue meds as listed.

## 2011-06-08 NOTE — Assessment & Plan Note (Signed)
He is due to have an echo in another 6 months and I will see him after this.

## 2011-06-08 NOTE — Progress Notes (Signed)
HPI The patient presents for follow of MR and atrial fibrillation.  He was in the hospital for dizziness and vertigo.  He has had therapy for interview her dysfunction and is currently undergoing physical therapy. In the hospital he was found to have paroxysmal atrial fibrillation. Echo confirmed moderate mitral regurgitation. Neuro workup including MRI and carotid Doppler's demonstrated no acute abnormalities. As an outpatient he wore a Holter monitor which demonstrated no atrial fibrillation.   Currently, he does not feel any palpitations. He's had no presyncope or syncope.  He wants to get back to exercising. He denies any shortness of breath, PND or orthopnea. He has had no chest pressure, neck or arm discomfort. He has had no weight gain or edema.  He uses an elliptical 3 times per week. While he's had no palpitations he has had some episodes of weakness requiring that he use his walker. His wife wondered whether this could be associated with PAF.  Allergies  Allergen Reactions  . Diamox (Acetazolamide)     unknown  . Neptazane (Methazolamide)     unknown  . Ciprofloxacin Rash  . Doxycycline Rash  . Erythromycin Rash  . Penicillins Swelling and Rash    Current Outpatient Prescriptions  Medication Sig Dispense Refill  . acetaminophen (TYLENOL) 500 MG chewable tablet Chew 1,000 mg by mouth once. As needed      . clindamycin (CLEOCIN) 150 MG capsule prior to dental work 4 tablets 150 mg as needed.       . clindamycin (CLEOCIN-T) 1 % gel Apply 1 application topically 2 (two) times daily.        . dabigatran (PRADAXA) 150 MG CAPS Take 150 mg by mouth every 12 (twelve) hours.        Marland Kitchen lisinopril-hydrochlorothiazide (PRINZIDE,ZESTORETIC) 20-12.5 MG per tablet Take 1 tablet by mouth 2 (two) times daily.        . meclizine (ANTIVERT) 25 MG tablet Take 25 mg by mouth 3 (three) times daily as needed. For vertigo      . metroNIDAZOLE (METROGEL) 1 % gel Apply 1 application topically 2 (two)  times daily.          Past Medical History  Diagnosis Date  . Hypertension   . Cataracts, bilateral   . Valvular heart disease   . Mitral regurgitation   . Aortic stenosis   . Paroxysmal atrial fibrillation   . Vertigo   . Transitional cell carcinoma of bladder     recurrent  . Prostate cancer   . Hx of colonic polyps   . Dizziness     Past Surgical History  Procedure Date  . Pleural effusion drainage 2010  . Radioactive seed implant   . Squamous cell carcinoma excision   . Thoracentesis     ROS:  As stated in the HPI and negative for all other systems.  PHYSICAL EXAM BP 119/97  Pulse 62  Ht 5\' 10"  (1.778 m)  Wt 91.627 kg (202 lb)  BMI 28.98 kg/m2 GENERAL:  Well appearing HEENT:  Fundi not visualized, oral mucosa unremarkable, disconjugate gaze with anisocoria.   NECK:  No jugular venous distention, waveform within normal limits, carotid upstroke brisk and symmetric, no bruits, no thyromegaly LYMPHATICS:  No cervical, inguinal adenopathy LUNGS:  Clear to auscultation bilaterally BACK:  No CVA tenderness CHEST:  Unremarkable HEART:  PMI not displaced or sustained,S1 and S2 within normal limits, no S3, no S4, no clicks, no rubs, apical holosystolic murmur and murmur radiating out the aortic  outflow tract. ABD:  Flat, positive bowel sounds normal in frequency in pitch, no bruits, no rebound, no guarding, no midline pulsatile mass, no hepatomegaly, no splenomegaly EXT:  2 plus pulses throughout, no edema, no cyanosis no clubbing  EKG:  Sinus rhythm, rate 62, axis within normal limits, intervals within normal limits, no acute ST-T wave changes.  ASSESSMENT AND PLAN

## 2011-06-08 NOTE — Patient Instructions (Signed)
Please with 2 D Echo in August.  Follow up her Dr Antoine Poche in August after your 2 D Echo  The current medical regimen is effective;  continue present plan and medications.

## 2011-10-28 ENCOUNTER — Encounter (HOSPITAL_BASED_OUTPATIENT_CLINIC_OR_DEPARTMENT_OTHER): Payer: Self-pay | Admitting: *Deleted

## 2011-10-28 ENCOUNTER — Emergency Department (HOSPITAL_BASED_OUTPATIENT_CLINIC_OR_DEPARTMENT_OTHER): Payer: Medicare Other

## 2011-10-28 ENCOUNTER — Emergency Department (HOSPITAL_BASED_OUTPATIENT_CLINIC_OR_DEPARTMENT_OTHER)
Admission: EM | Admit: 2011-10-28 | Discharge: 2011-10-28 | Disposition: A | Discharge status: Home / Self Care | Payer: Medicare Other | Attending: Emergency Medicine | Admitting: Emergency Medicine

## 2011-10-28 DIAGNOSIS — Z8546 Personal history of malignant neoplasm of prostate: Secondary | ICD-10-CM | POA: Insufficient documentation

## 2011-10-28 DIAGNOSIS — I10 Essential (primary) hypertension: Secondary | ICD-10-CM | POA: Insufficient documentation

## 2011-10-28 DIAGNOSIS — S0003XA Contusion of scalp, initial encounter: Secondary | ICD-10-CM | POA: Insufficient documentation

## 2011-10-28 DIAGNOSIS — Y9239 Other specified sports and athletic area as the place of occurrence of the external cause: Secondary | ICD-10-CM | POA: Insufficient documentation

## 2011-10-28 DIAGNOSIS — I059 Rheumatic mitral valve disease, unspecified: Secondary | ICD-10-CM | POA: Insufficient documentation

## 2011-10-28 DIAGNOSIS — I4891 Unspecified atrial fibrillation: Secondary | ICD-10-CM | POA: Insufficient documentation

## 2011-10-28 DIAGNOSIS — W08XXXA Fall from other furniture, initial encounter: Secondary | ICD-10-CM | POA: Insufficient documentation

## 2011-10-28 NOTE — ED Provider Notes (Addendum)
History     CSN: [id number]  Arrival date & time 10/28/11  1617   First MD Initiated Contact with Patient 10/28/11 1627      Chief Complaint  Patient presents with  . Head Injury    (Consider location/radiation/quality/duration/timing/severity/associated sxs/prior treatment) Patient is a 78 y.o. male presenting with head injury. The history is provided by the patient.  Head Injury  The incident occurred 6 to 12 hours ago (was sitting down at a weight machine at the gym and missed the seat falling backwards and hitting his head.). He came to the ER via walk-in. The injury mechanism was a direct blow and a fall. There was no loss of consciousness. There was no blood loss. Quality: no pain. The pain is at a severity of 0/10. The patient is experiencing no pain. Pertinent negatives include no numbness, no blurred vision, no vomiting, patient does not experience disorientation and no weakness. He has tried nothing for the symptoms. The treatment provided significant relief.    Past Medical History  Diagnosis Date  . Hypertension   . Cataracts, bilateral   . Valvular heart disease   . Mitral regurgitation   . Aortic stenosis   . Paroxysmal atrial fibrillation   . Vertigo   . Transitional cell carcinoma of bladder     recurrent  . Prostate cancer   . Hx of colonic polyps   . Dizziness     Past Surgical History  Procedure Date  . Pleural effusion drainage 2010  . Radioactive seed implant   . Squamous cell carcinoma excision   . Thoracentesis     Family History  Problem Relation Age of Onset  . Other Father 62     of a PE after surgery  . Other       there is no known coronary artery disease     History  Substance Use Topics  . Smoking status: Never Smoker   . Smokeless tobacco: Not on file  . Alcohol Use: No      Review of Systems  Eyes: Negative for blurred vision.  Gastrointestinal: Negative for vomiting.  Neurological: Negative for weakness and numbness.    All other systems reviewed and are negative.    Allergies  Diamox; Neptazane; Ciprofloxacin; Doxycycline; Erythromycin; and Penicillins  Home Medications   Current Outpatient Rx  Name Route Sig Dispense Refill  . CLINDAMYCIN PHOSPHATE 1 % EX GEL Topical Apply 1 application topically 2 (two) times daily.      Marland Kitchen DABIGATRAN ETEXILATE MESYLATE 150 MG PO CAPS Oral Take 150 mg by mouth every 12 (twelve) hours.      Marland Kitchen LISINOPRIL-HYDROCHLOROTHIAZIDE 20-12.5 MG PO TABS Oral Take 1 tablet by mouth 2 (two) times daily.      Marland Kitchen METRONIDAZOLE 0.75 % EX CREA Topical Apply 1 application topically 2 (two) times daily.    Marland Kitchen METRONIDAZOLE 1 % EX GEL Topical Apply 1 application topically 2 (two) times daily.      . ACETAMINOPHEN 500 MG PO CHEW Oral Chew 1,000 mg by mouth once. As needed      BP 138/64  Pulse 69  Temp 99.1 F (37.3 C)  Resp 16  Ht 5\' 10"  (1.778 m)  Wt 200 lb (90.719 kg)  BMI 28.70 kg/m2  SpO2 100%  Physical Exam  Nursing note and vitals reviewed. Constitutional: He is oriented to person, place, and time. He appears well-developed and well-nourished. No distress.  HENT:  Head: Normocephalic. Head is with contusion.  Mouth/Throat: Oropharynx is clear and moist.  Eyes: Conjunctivae and EOM are normal. Pupils are equal, round, and reactive to light.       Strabismus of the left eye  Neck: Normal range of motion. Neck supple.  Cardiovascular: Normal rate, regular rhythm and intact distal pulses.   No murmur heard. Pulmonary/Chest: Effort normal and breath sounds normal. No respiratory distress. He has no wheezes. He has no rales.  Musculoskeletal: Normal range of motion. He exhibits no edema and no tenderness.  Neurological: He is alert and oriented to person, place, and time.  Skin: Skin is warm and dry. No rash noted. No erythema.  Psychiatric: He has a normal mood and affect. His behavior is normal.    ED Course  Procedures (including critical care time)  Labs  Reviewed - No data to display Ct Head Wo Contrast  10/28/2011  *RADIOLOGY REPORT*  Clinical Data: Fall and hit head.  The patient is on anticoagulation.  CT HEAD WITHOUT CONTRAST  Technique:  Contiguous axial images were obtained from the base of the skull through the vertex without contrast.  Comparison: 01/28/2011.  Findings: There is no evidence for acute infarction, intracranial hemorrhage, mass lesion, hydrocephalus, or extra-axial fluid. There is slight atrophy with chronic microvascular ischemic change. Calvarium shows numerous venous lakes but there is no skull fracture or osseous destructive lesion.  Paranasal sinuses and mastoids are clear.  There is chronic deformity of the left globe. There is a similar appearance to priors.  IMPRESSION: Chronic changes as described.  No acute intracranial abnormality.  Original Report Authenticated By: Elsie Stain, M.D.     1. Scalp contusion       MDM   Pt with mechanical fall today and hit his head and is on pradaxa.  Denies any sx but small ecchymosis on the scalp.  Normal exam.  CT neg and pt d/ced home.        Gwyneth Sprout, MD 10/28/11 1658  Gwyneth Sprout, MD 10/28/11 1659

## 2011-10-28 NOTE — ED Notes (Signed)
Pt c/o head injury this am. Pt is on blood thinners

## 2011-11-17 ENCOUNTER — Ambulatory Visit (HOSPITAL_COMMUNITY): Payer: Medicare Other | Attending: Cardiology | Admitting: Radiology

## 2011-11-17 ENCOUNTER — Other Ambulatory Visit (HOSPITAL_COMMUNITY): Payer: Medicare Other

## 2011-11-17 DIAGNOSIS — R011 Cardiac murmur, unspecified: Secondary | ICD-10-CM | POA: Insufficient documentation

## 2011-11-17 DIAGNOSIS — I10 Essential (primary) hypertension: Secondary | ICD-10-CM | POA: Insufficient documentation

## 2011-11-17 DIAGNOSIS — R42 Dizziness and giddiness: Secondary | ICD-10-CM | POA: Insufficient documentation

## 2011-11-17 DIAGNOSIS — I340 Nonrheumatic mitral (valve) insufficiency: Secondary | ICD-10-CM

## 2011-11-17 DIAGNOSIS — I517 Cardiomegaly: Secondary | ICD-10-CM | POA: Insufficient documentation

## 2011-11-17 DIAGNOSIS — I4891 Unspecified atrial fibrillation: Secondary | ICD-10-CM | POA: Insufficient documentation

## 2011-11-17 DIAGNOSIS — I359 Nonrheumatic aortic valve disorder, unspecified: Secondary | ICD-10-CM | POA: Insufficient documentation

## 2011-11-17 DIAGNOSIS — I059 Rheumatic mitral valve disease, unspecified: Secondary | ICD-10-CM | POA: Insufficient documentation

## 2011-11-17 NOTE — Progress Notes (Signed)
Echocardiogram performed.  

## 2011-12-01 ENCOUNTER — Encounter: Payer: Self-pay | Admitting: Cardiology

## 2011-12-01 ENCOUNTER — Ambulatory Visit (INDEPENDENT_AMBULATORY_CARE_PROVIDER_SITE_OTHER): Payer: Medicare Other | Admitting: Cardiology

## 2011-12-01 VITALS — BP 140/80 | HR 59 | Ht 70.0 in | Wt 210.0 lb

## 2011-12-01 DIAGNOSIS — I4891 Unspecified atrial fibrillation: Secondary | ICD-10-CM

## 2011-12-01 DIAGNOSIS — I10 Essential (primary) hypertension: Secondary | ICD-10-CM

## 2011-12-01 DIAGNOSIS — I340 Nonrheumatic mitral (valve) insufficiency: Secondary | ICD-10-CM

## 2011-12-01 DIAGNOSIS — I059 Rheumatic mitral valve disease, unspecified: Secondary | ICD-10-CM

## 2011-12-01 DIAGNOSIS — I480 Paroxysmal atrial fibrillation: Secondary | ICD-10-CM

## 2011-12-01 NOTE — Progress Notes (Signed)
HPI The patient presents for follow of MR and atrial fibrillation.  He was hospitalized several months ago with dizziness and vertigo. He has not had this problem since that time. He has had no presyncope or syncope. He he denies any new symptoms such as chest pressure, neck or arm discomfort. He's not noticing his atrial fibrillation. He has no shortness of breath, PND or orthopnea. He did have a followup echocardiogram which I reviewed with him today. The EF was normal. He has moderately severe aortic stenosis which is unchanged from previous moderate mitral regurgitation also unchanged.   Allergies  Allergen Reactions  . Diamox (Acetazolamide)     unknown  . Neptazane (Methazolamide)     unknown  . Ciprofloxacin Rash  . Doxycycline Rash  . Erythromycin Rash  . Penicillins Swelling and Rash    Current Outpatient Prescriptions  Medication Sig Dispense Refill  . acetaminophen (TYLENOL) 500 MG chewable tablet Chew 1,000 mg by mouth once. As needed      . clindamycin (CLEOCIN-T) 1 % gel Apply 1 application topically 2 (two) times daily.        . dabigatran (PRADAXA) 150 MG CAPS Take 150 mg by mouth every 12 (twelve) hours.        Marland Kitchen lisinopril-hydrochlorothiazide (PRINZIDE,ZESTORETIC) 20-12.5 MG per tablet Take 1 tablet by mouth 2 (two) times daily.        . metroNIDAZOLE (METROCREAM) 0.75 % cream Apply 1 application topically 2 (two) times daily.      . metroNIDAZOLE (METROGEL) 1 % gel Apply 1 application topically 2 (two) times daily.        . mometasone (NASONEX) 50 MCG/ACT nasal spray Place 2 sprays into the nose as needed.        Past Medical History  Diagnosis Date  . Hypertension   . Cataracts, bilateral   . Valvular heart disease   . Mitral regurgitation   . Aortic stenosis   . Paroxysmal atrial fibrillation   . Vertigo   . Transitional cell carcinoma of bladder     recurrent  . Prostate cancer   . Hx of colonic polyps   . Dizziness     Past Surgical History    Procedure Date  . Pleural effusion drainage 2010  . Radioactive seed implant   . Squamous cell carcinoma excision   . Thoracentesis     ROS:  As stated in the HPI and negative for all other systems.  PHYSICAL EXAM BP 140/80  Pulse 59  Ht 5\' 10"  (1.778 m)  Wt 210 lb (95.255 kg)  BMI 30.13 kg/m2 GENERAL:  Well appearing HEENT:  Fundi not visualized, oral mucosa unremarkable, disconjugate gaze with anisocoria.   NECK:  No jugular venous distention, waveform within normal limits, carotid upstroke brisk and symmetric, no bruits, no thyromegaly LYMPHATICS:  No cervical, inguinal adenopathy LUNGS:  Clear to auscultation bilaterally BACK:  No CVA tenderness CHEST:  Unremarkable HEART:  PMI not displaced or sustained,S1 and S2 within normal limits, no S3, no S4, no clicks, no rubs, apical holosystolic murmur and murmur radiating out the aortic outflow tract. ABD:  Flat, positive bowel sounds normal in frequency in pitch, no bruits, no rebound, no guarding, no midline pulsatile mass, no hepatomegaly, no splenomegaly EXT:  2 plus pulses throughout, no edema, no cyanosis no clubbing  EKG:  Sinus rhythm, rate 59, axis within normal limits, intervals within normal limits, no acute ST-T wave changes.  12/01/2011  ASSESSMENT AND PLAN  Paroxysmal atrial  fibrillation -  He tolerates this rhythm and rate control and anticoagulation. We discussed alternative therapies. No other change in therapy is indicated he is happy with his current choice of anticoagulation.  Mitral regurgitation Check this again in one year.  Aortic stenosis -  We reviewed his anatomy and physiology and he has no symptoms related to this. Again this will be assessed in one year.  Hypertension -  The blood pressure is at target. No change in medications is indicated. We will continue with therapeutic lifestyle changes (TLC).

## 2011-12-01 NOTE — Patient Instructions (Addendum)
The current medical regimen is effective;  continue present plan and medications.  Your physician has requested that you have an echocardiogram in 1 year. Echocardiography is a painless test that uses sound waves to create images of your heart. It provides your doctor with information about the size and shape of your heart and how well your heart's chambers and valves are working. This procedure takes approximately one hour. There are no restrictions for this procedure.  Follow up in 1 year with Dr Hochrein.  You will receive a letter in the mail 2 months before you are due.  Please call us when you receive this letter to schedule your follow up appointment.  

## 2012-02-26 ENCOUNTER — Encounter: Payer: Self-pay | Admitting: Cardiology

## 2012-03-24 ENCOUNTER — Telehealth: Payer: Self-pay | Admitting: *Deleted

## 2012-03-24 NOTE — Telephone Encounter (Signed)
Pt walked into office with concerns of a nose bleed.  States when he woke this am he had a quarter to 50 cent piece size spot of blood on his pillow.  He used his netti-pot and all fluids were clear.  Since then his nose continues to bleed some.  He has used one handkerchief and started on another one.  The blood is not dripping.  He is picking at it and pulling out clots.  Advised pt to stop picking or blowing his nose.  Advised I will discuss with Dr Antoine Poche but since the amount has decreased and is slowing down no emergent needs at this time to be addressed.  Requested that if the amount of blood increases before I call him this afternoon - he should call back.  He states understanding.  Of note - he reports being seen by Urology Monday and was told he had a trace amount to blood in his urine.  It can not be seen in the urine.  Wife is concerned the nose bleed and the trace amount of blood in the urine are connected.  Urologist told then it was probably from his prior procedures including radiational implants.  Pt also wanting to know if he can continue to drink red wine.  Advised to hold off on wine at this point. (drinks one to two glasses every evening)  He is on Pradaxa and has been for over 1 year.  This is the first time he has noticed any nose bleeding.

## 2012-03-25 NOTE — Telephone Encounter (Signed)
The patient should limit his wine intake to no more than one glass per night. He can come back for a PT PTT, CBC and liver function. If he has continued bleeding he should stop her Biaxin and schedule followup with [place].

## 2012-03-25 NOTE — Telephone Encounter (Signed)
Pt aware of orders and to stop Pradaxa if bleeding continues.  He will call back if further problems.  He has had no noticeable nosebleeding since yesterday

## 2012-05-03 ENCOUNTER — Other Ambulatory Visit: Payer: Self-pay | Admitting: Dermatology

## 2012-07-12 ENCOUNTER — Emergency Department (HOSPITAL_BASED_OUTPATIENT_CLINIC_OR_DEPARTMENT_OTHER): Payer: Medicare Other

## 2012-07-12 ENCOUNTER — Emergency Department (HOSPITAL_BASED_OUTPATIENT_CLINIC_OR_DEPARTMENT_OTHER)
Admission: EM | Admit: 2012-07-12 | Discharge: 2012-07-12 | Disposition: A | Discharge status: Home / Self Care | Payer: Medicare Other | Attending: Emergency Medicine | Admitting: Emergency Medicine

## 2012-07-12 ENCOUNTER — Encounter (HOSPITAL_BASED_OUTPATIENT_CLINIC_OR_DEPARTMENT_OTHER): Payer: Self-pay | Admitting: *Deleted

## 2012-07-12 DIAGNOSIS — Z79899 Other long term (current) drug therapy: Secondary | ICD-10-CM | POA: Insufficient documentation

## 2012-07-12 DIAGNOSIS — Z8551 Personal history of malignant neoplasm of bladder: Secondary | ICD-10-CM | POA: Insufficient documentation

## 2012-07-12 DIAGNOSIS — Z860100 Personal history of colon polyps, unspecified: Secondary | ICD-10-CM | POA: Insufficient documentation

## 2012-07-12 DIAGNOSIS — D689 Coagulation defect, unspecified: Secondary | ICD-10-CM | POA: Insufficient documentation

## 2012-07-12 DIAGNOSIS — Z7901 Long term (current) use of anticoagulants: Secondary | ICD-10-CM

## 2012-07-12 DIAGNOSIS — Z8546 Personal history of malignant neoplasm of prostate: Secondary | ICD-10-CM | POA: Insufficient documentation

## 2012-07-12 DIAGNOSIS — I10 Essential (primary) hypertension: Secondary | ICD-10-CM | POA: Insufficient documentation

## 2012-07-12 DIAGNOSIS — Z8679 Personal history of other diseases of the circulatory system: Secondary | ICD-10-CM | POA: Insufficient documentation

## 2012-07-12 DIAGNOSIS — Y9301 Activity, walking, marching and hiking: Secondary | ICD-10-CM | POA: Insufficient documentation

## 2012-07-12 DIAGNOSIS — S0990XA Unspecified injury of head, initial encounter: Secondary | ICD-10-CM | POA: Insufficient documentation

## 2012-07-12 DIAGNOSIS — I2510 Atherosclerotic heart disease of native coronary artery without angina pectoris: Secondary | ICD-10-CM | POA: Insufficient documentation

## 2012-07-12 DIAGNOSIS — W1809XA Striking against other object with subsequent fall, initial encounter: Secondary | ICD-10-CM | POA: Insufficient documentation

## 2012-07-12 DIAGNOSIS — Y929 Unspecified place or not applicable: Secondary | ICD-10-CM | POA: Insufficient documentation

## 2012-07-12 DIAGNOSIS — Z8669 Personal history of other diseases of the nervous system and sense organs: Secondary | ICD-10-CM | POA: Insufficient documentation

## 2012-07-12 NOTE — ED Provider Notes (Signed)
History     CSN: [id number]  Arrival date & time 07/12/12  1500   First MD Initiated Contact with Patient 07/12/12 1514      Chief Complaint  Patient presents with  . Fall    (Consider location/radiation/quality/duration/timing/severity/associated sxs/prior treatment) HPI Comments: Patient takes pradaxa for afib.  Today was taking out the garbage when he fell and hit the back of his head on the concrete.  He reports only a mild headache but otherwise feels fine.  He was told to be seen any time he hits his head since he is on the blood thinner.  Patient is a 79 y.o. male presenting with fall. The history is provided by the patient.  Fall Incident onset: 8 AM. The fall occurred while walking. He fell from a height of 1 to 2 ft. He landed on concrete. There was no blood loss. The point of impact was the head. The pain is present in the head. The pain is mild. He was ambulatory at the scene. Pertinent negatives include no visual change, no nausea, no vomiting and no loss of consciousness. He has tried nothing for the symptoms.    Past Medical History  Diagnosis Date  . Hypertension   . Cataracts, bilateral   . Valvular heart disease   . Mitral regurgitation   . Aortic stenosis   . Paroxysmal atrial fibrillation   . Vertigo   . Transitional cell carcinoma of bladder     recurrent  . Prostate cancer   . Hx of colonic polyps   . Dizziness   . Coronary artery disease     Past Surgical History  Procedure Laterality Date  . Pleural effusion drainage  2010  . Radioactive seed implant    . Squamous cell carcinoma excision    . Thoracentesis      Family History  Problem Relation Age of Onset  . Other Father 71     of a PE after surgery  . Other       there is no known coronary artery disease     History  Substance Use Topics  . Smoking status: Never Smoker   . Smokeless tobacco: Not on file  . Alcohol Use: Yes     Comment: occassionally      Review of Systems   Gastrointestinal: Negative for nausea and vomiting.  Neurological: Negative for loss of consciousness.  All other systems reviewed and are negative.    Allergies  Diamox; Neptazane; Ciprofloxacin; Doxycycline; Erythromycin; and Penicillins  Home Medications   Current Outpatient Rx  Name  Route  Sig  Dispense  Refill  . acetaminophen (TYLENOL) 500 MG chewable tablet   Oral   Chew 1,000 mg by mouth once. As needed         . clindamycin (CLEOCIN-T) 1 % gel   Topical   Apply 1 application topically 2 (two) times daily.           . dabigatran (PRADAXA) 150 MG CAPS   Oral   Take 150 mg by mouth every 12 (twelve) hours.           Marland Kitchen lisinopril-hydrochlorothiazide (PRINZIDE,ZESTORETIC) 20-12.5 MG per tablet   Oral   Take 1 tablet by mouth 2 (two) times daily.           . metroNIDAZOLE (METROCREAM) 0.75 % cream   Topical   Apply 1 application topically 2 (two) times daily.         . metroNIDAZOLE (METROGEL) 1 % gel  Topical   Apply 1 application topically 2 (two) times daily.           . mometasone (NASONEX) 50 MCG/ACT nasal spray   Nasal   Place 2 sprays into the nose as needed.           BP 115/55  Pulse 74  Temp(Src) 98.4 F (36.9 C) (Oral)  Resp 20  Ht 5\' 10"  (1.778 m)  Wt 189 lb (85.73 kg)  BMI 27.12 kg/m2  SpO2 97%  Physical Exam  Nursing note and vitals reviewed. Constitutional: He is oriented to person, place, and time. He appears well-developed and well-nourished. No distress.  HENT:  Head: Normocephalic and atraumatic.  Mouth/Throat: Oropharynx is clear and moist.  Eyes: EOM are normal.  The left eye has an elliptical pupil that is non-reactive.  He has no vision in this eye since a retinal detachment many years ago.  Neck: Normal range of motion. Neck supple.  Cardiovascular: Normal rate and regular rhythm.   No murmur heard. Pulmonary/Chest: Effort normal and breath sounds normal.  Abdominal: Soft. Bowel sounds are normal.   Lymphadenopathy:    He has no cervical adenopathy.  Neurological: He is alert and oriented to person, place, and time. No cranial nerve deficit. He exhibits normal muscle tone. Coordination normal.  Skin: Skin is warm and dry. He is not diaphoretic.    ED Course  Procedures (including critical care time)  Labs Reviewed - No data to display No results found.   No diagnosis found.    MDM  The ct looks okay and the patient is neurologically intact.   He will be given instructions for return, follow up prn.        Geoffery Lyons, MD 07/12/12 [phone]

## 2012-07-12 NOTE — ED Notes (Signed)
Patient states this morning around 0830, he was taking out the garbage and the can rolled away from him, causing him to fall to his left knee and twist around hitting his posterior head.  Denies loc.  States he take a blood thinner and was told any time he hits his head, he needs to be checked. Abrasion to his left knee, hard swelling to left posterior head.

## 2012-07-21 ENCOUNTER — Other Ambulatory Visit: Payer: Self-pay | Admitting: Dermatology

## 2012-12-01 ENCOUNTER — Encounter: Payer: Self-pay | Admitting: Cardiology

## 2012-12-01 ENCOUNTER — Ambulatory Visit (INDEPENDENT_AMBULATORY_CARE_PROVIDER_SITE_OTHER): Payer: Medicare Other | Admitting: Cardiology

## 2012-12-01 ENCOUNTER — Other Ambulatory Visit (HOSPITAL_COMMUNITY): Payer: Self-pay | Admitting: Cardiology

## 2012-12-01 ENCOUNTER — Ambulatory Visit (HOSPITAL_COMMUNITY): Payer: Medicare Other | Attending: Internal Medicine | Admitting: Radiology

## 2012-12-01 VITALS — BP 118/78 | HR 68 | Ht 70.0 in | Wt 189.0 lb

## 2012-12-01 DIAGNOSIS — I080 Rheumatic disorders of both mitral and aortic valves: Secondary | ICD-10-CM | POA: Insufficient documentation

## 2012-12-01 DIAGNOSIS — I359 Nonrheumatic aortic valve disorder, unspecified: Secondary | ICD-10-CM

## 2012-12-01 DIAGNOSIS — I059 Rheumatic mitral valve disease, unspecified: Secondary | ICD-10-CM

## 2012-12-01 DIAGNOSIS — I480 Paroxysmal atrial fibrillation: Secondary | ICD-10-CM

## 2012-12-01 DIAGNOSIS — I4891 Unspecified atrial fibrillation: Secondary | ICD-10-CM

## 2012-12-01 DIAGNOSIS — I10 Essential (primary) hypertension: Secondary | ICD-10-CM

## 2012-12-01 DIAGNOSIS — I340 Nonrheumatic mitral (valve) insufficiency: Secondary | ICD-10-CM

## 2012-12-01 DIAGNOSIS — R011 Cardiac murmur, unspecified: Secondary | ICD-10-CM

## 2012-12-01 NOTE — Progress Notes (Signed)
Echocardiogram performed.  

## 2012-12-01 NOTE — Progress Notes (Addendum)
Echocardiogram performed. Report faxed  

## 2012-12-01 NOTE — Patient Instructions (Addendum)
The current medical regimen is effective;  continue present plan and medications.  Follow up in 1 year with Dr Antoine Poche with a 2 D Echo the same day.  You will receive a letter in the mail 2 months before you are due.  Please call us when you receive this letter to schedule your follow up appointment.

## 2012-12-01 NOTE — Progress Notes (Signed)
HPI The patient presents for follow of MR and atrial fibrillation.   Ehocardiogram in August of last year EF was normal. He has moderately severe aortic stenosis which is unchanged from previous moderate mitral regurgitation also unchanged. I brought him back for one-year followup today. He had an echo today. I reviewed this and he is aortic valve gradient is the same as previous with moderately severe AS. He has moderate mitral regurgitation. The EF is again well preserved. I looked at these pictures.  He denies any ongoing symptoms. He exercises routinely. He has had no chest pressure, neck or arm discomfort. He has had no palpitations, presyncope or syncope. He denies any PND or orthopnea. He's had no weight gain or edema.   Allergies  Allergen Reactions  . Diamox [Acetazolamide]     unknown  . Neptazane [Methazolamide]     unknown  . Ciprofloxacin Rash  . Doxycycline Rash  . Erythromycin Rash  . Penicillins Swelling and Rash    Current Outpatient Prescriptions  Medication Sig Dispense Refill  . acetaminophen (TYLENOL) 500 MG chewable tablet Chew 1,000 mg by mouth once. As needed      . clindamycin (CLEOCIN-T) 1 % gel Apply 1 application topically 2 (two) times daily.        . dabigatran (PRADAXA) 150 MG CAPS Take 150 mg by mouth every 12 (twelve) hours.        Marland Kitchen lisinopril-hydrochlorothiazide (PRINZIDE,ZESTORETIC) 20-12.5 MG per tablet Take 1 tablet by mouth 2 (two) times daily.        . metroNIDAZOLE (METROCREAM) 0.75 % cream Apply 1 application topically 2 (two) times daily as needed.       . mometasone (NASONEX) 50 MCG/ACT nasal spray Place 2 sprays into the nose as needed.      . triamcinolone ointment (KENALOG) 0.1 % as needed.        No current facility-administered medications for this visit.    Past Medical History  Diagnosis Date  . Hypertension   . Cataracts, bilateral   . Valvular heart disease   . Mitral regurgitation   . Aortic stenosis   . Paroxysmal atrial  fibrillation   . Vertigo   . Transitional cell carcinoma of bladder     recurrent  . Prostate cancer   . Hx of colonic polyps   . Dizziness   . Coronary artery disease     Past Surgical History  Procedure Laterality Date  . Pleural effusion drainage  2010  . Radioactive seed implant    . Squamous cell carcinoma excision    . Thoracentesis      ROS:  As stated in the HPI and negative for all other systems.  PHYSICAL EXAM BP 118/78  Pulse 68  Ht 5\' 10"  (1.778 m)  Wt 189 lb (85.73 kg)  BMI 27.12 kg/m2 GENERAL:  Well appearing HEENT:  Fundi not visualized, oral mucosa unremarkable, disconjugate gaze with anisocoria.   NECK:  No jugular venous distention, waveform within normal limits, carotid upstroke brisk and symmetric, no bruits, no thyromegaly LYMPHATICS:  No cervical, inguinal adenopathy LUNGS:  Clear to auscultation bilaterally BACK:  No CVA tenderness CHEST:  Unremarkable HEART:  PMI not displaced or sustained,S1 and S2 within normal limits, no S3, no S4, no clicks, no rubs, apical holosystolic murmur and murmur radiating out the aortic outflow tract. ABD:  Flat, positive bowel sounds normal in frequency in pitch, no bruits, no rebound, no guarding, no midline pulsatile mass, no hepatomegaly, no splenomegaly, varicose  veins. EXT:  2 plus pulses throughout, no edema, no cyanosis no clubbing  EKG:  Sinus rhythm,  axis within normal limits, intervals within normal limits, no acute ST-T wave changes.  12/01/2012  ASSESSMENT AND PLAN  Paroxysmal atrial fibrillation -  He has had no symptomatic paroxysms. He tolerates the Pradaxa and he will continue with this.  Mitral regurgitation I will check this again in one year.  Aortic stenosis -  This is stable I will follow this up with symptoms and with an echocardiogram in one year.  Hypertension -  The blood pressure is at target. No change in medications is indicated.

## 2013-01-02 ENCOUNTER — Encounter: Payer: Self-pay | Admitting: Internal Medicine

## 2013-01-04 ENCOUNTER — Telehealth: Payer: Self-pay | Admitting: Internal Medicine

## 2013-01-04 NOTE — Telephone Encounter (Signed)
Pt states he received a colon recall letter. Pt thought that due to his age he did not need to have another colon done. Please advise.

## 2013-01-05 NOTE — Telephone Encounter (Signed)
Age is not an absolute. He is higher risk with multiple polyps previously. We could argue either way. If he is asymptomatic and not at all interested, or in poor health, then fine. However, I would be happy to discuss with him in the office, otherwise. Thanks

## 2013-01-05 NOTE — Telephone Encounter (Signed)
Spoke with patient and gave him Dr. Lamar Sprinkles recommendations. Patient scheduled for OV to discuss on 02/13/13 at 2:30 PM.

## 2013-01-05 NOTE — Telephone Encounter (Signed)
Left a message for patient to call me. 

## 2013-02-05 ENCOUNTER — Emergency Department (HOSPITAL_BASED_OUTPATIENT_CLINIC_OR_DEPARTMENT_OTHER)
Admission: EM | Admit: 2013-02-05 | Discharge: 2013-02-05 | Disposition: A | Discharge status: Home / Self Care | Payer: Medicare Other | Attending: Emergency Medicine | Admitting: Emergency Medicine

## 2013-02-05 ENCOUNTER — Encounter (HOSPITAL_BASED_OUTPATIENT_CLINIC_OR_DEPARTMENT_OTHER): Payer: Self-pay | Admitting: Emergency Medicine

## 2013-02-05 DIAGNOSIS — Z880 Allergy status to penicillin: Secondary | ICD-10-CM | POA: Insufficient documentation

## 2013-02-05 DIAGNOSIS — Z860100 Personal history of colon polyps, unspecified: Secondary | ICD-10-CM | POA: Insufficient documentation

## 2013-02-05 DIAGNOSIS — Z8669 Personal history of other diseases of the nervous system and sense organs: Secondary | ICD-10-CM | POA: Insufficient documentation

## 2013-02-05 DIAGNOSIS — I10 Essential (primary) hypertension: Secondary | ICD-10-CM | POA: Insufficient documentation

## 2013-02-05 DIAGNOSIS — R0982 Postnasal drip: Secondary | ICD-10-CM | POA: Insufficient documentation

## 2013-02-05 DIAGNOSIS — Z8546 Personal history of malignant neoplasm of prostate: Secondary | ICD-10-CM | POA: Insufficient documentation

## 2013-02-05 DIAGNOSIS — Z79899 Other long term (current) drug therapy: Secondary | ICD-10-CM | POA: Insufficient documentation

## 2013-02-05 DIAGNOSIS — Z8551 Personal history of malignant neoplasm of bladder: Secondary | ICD-10-CM | POA: Insufficient documentation

## 2013-02-05 DIAGNOSIS — Z792 Long term (current) use of antibiotics: Secondary | ICD-10-CM | POA: Insufficient documentation

## 2013-02-05 DIAGNOSIS — I2510 Atherosclerotic heart disease of native coronary artery without angina pectoris: Secondary | ICD-10-CM | POA: Insufficient documentation

## 2013-02-05 MED ORDER — MOMETASONE FUROATE 50 MCG/ACT NA SUSP: 2.0000 | Freq: Every day | NASAL | Status: DC

## 2013-02-05 NOTE — ED Notes (Signed)
Pt states been bothered by allergies for past 2 weeks, states draining to back of throat, coughing up white sputum, denies nausea, vomiting

## 2013-02-05 NOTE — ED Provider Notes (Signed)
CSN: [id number]     Arrival date & time 02/05/13  1857 History  This chart was scribed for Aaron B. Bernette Mayers, MD by Leone Payor, ED Scribe. This patient was seen in room MH11/MH11 and the patient's care was started 7:26 PM.    Chief Complaint  Patient presents with  . Cough  . Allergies   The history is provided by the patient. No language interpreter was used.    HPI Comments: Aaron Lucas is a 79 y.o. male who presents to the Emergency Department complaining of rhinorrhea from allergies for the past 2 weeks. Pt states he has some postnasal drainage from this which is causing him to cough. He states all the drainage and cough sputum has been clear. He has been using Nasonex for the past few nights with relief. He has been drinking honey and vinegar as well. Pt wants to be sure his symptoms are not more serious than they are. He denies chest pain, fever, SOB, abdominal pain, emesis, diarrhea. Pt has a history of HTN, valvular heart disease, mitral regurgitation, aortic stenosis, paroxysmal atrial fibrillation, CAD.    Past Medical History  Diagnosis Date  . Hypertension   . Cataracts, bilateral   . Valvular heart disease   . Mitral regurgitation   . Aortic stenosis   . Paroxysmal atrial fibrillation   . Vertigo   . Transitional cell carcinoma of bladder     recurrent  . Prostate cancer   . Hx of colonic polyps   . Dizziness   . Coronary artery disease    Past Surgical History  Procedure Laterality Date  . Pleural effusion drainage  2010  . Radioactive seed implant    . Squamous cell carcinoma excision    . Thoracentesis     Family History  Problem Relation Age of Onset  . Other Father 41     of a PE after surgery  . Other       there is no known coronary artery disease    History  Substance Use Topics  . Smoking status: Never Smoker   . Smokeless tobacco: Not on file  . Alcohol Use: Yes     Comment: occassionally    Review of Systems A complete 10 system  review of systems was obtained and all systems are negative except as noted in the HPI and PMH.   Allergies  Diamox; Neptazane; Ciprofloxacin; Doxycycline; Erythromycin; and Penicillins  Home Medications   Current Outpatient Rx  Name  Route  Sig  Dispense  Refill  . acetaminophen (TYLENOL) 500 MG chewable tablet   Oral   Chew 1,000 mg by mouth once. As needed         . clindamycin (CLEOCIN-T) 1 % gel   Topical   Apply 1 application topically 2 (two) times daily.           . dabigatran (PRADAXA) 150 MG CAPS   Oral   Take 150 mg by mouth every 12 (twelve) hours.           Marland Kitchen lisinopril-hydrochlorothiazide (PRINZIDE,ZESTORETIC) 20-12.5 MG per tablet   Oral   Take 1 tablet by mouth 2 (two) times daily.           . metroNIDAZOLE (METROCREAM) 0.75 % cream   Topical   Apply 1 application topically 2 (two) times daily as needed.          . mometasone (NASONEX) 50 MCG/ACT nasal spray   Nasal   Place 2 sprays into  the nose as needed.         . triamcinolone ointment (KENALOG) 0.1 %      as needed.           Triage Vitals: BP 142/46  Pulse 75  Temp(Src) 98.1 F (36.7 C) (Oral)  Resp 18  Ht 5\' 10"  (1.778 m)  Wt 187 lb (84.823 kg)  BMI 26.83 kg/m2  SpO2 99% Physical Exam  Nursing note and vitals reviewed. Constitutional: He is oriented to person, place, and time. He appears well-developed and well-nourished.  HENT:  Head: Normocephalic and atraumatic.  Eyes: EOM are normal. Pupils are equal, round, and reactive to light.  Neck: Normal range of motion. Neck supple.  Cardiovascular: Normal rate and intact distal pulses.   Murmur heard. Pulmonary/Chest: Effort normal and breath sounds normal.  Abdominal: Bowel sounds are normal. He exhibits no distension. There is no tenderness.  Musculoskeletal: Normal range of motion. He exhibits no edema and no tenderness.  Neurological: He is alert and oriented to person, place, and time. He has normal strength. No cranial  nerve deficit or sensory deficit.  Skin: Skin is warm and dry. No rash noted.  Psychiatric: He has a normal mood and affect.    ED Course  Procedures   DIAGNOSTIC STUDIES: Oxygen Saturation is 99% on RA, normal by my interpretation.    COORDINATION OF CARE: 7:27 PM Discussed treatment plan with pt at bedside and pt agreed to plan.   Labs Review Labs Reviewed - No data to display Imaging Review No results found.  EKG Interpretation   None       MDM   1. Post-nasal drip     Nasal drainage with mild cough. No concern for PNA. Normal exam and vitals. Has had good relief with Nasonex this week and has PCP appointment in 2 days.   I personally performed the services described in this documentation, which was scribed in my presence. The recorded information has been reviewed and is accurate.      Aaron B. Bernette Mayers, MD 02/05/13 [id number]

## 2013-02-13 ENCOUNTER — Ambulatory Visit (INDEPENDENT_AMBULATORY_CARE_PROVIDER_SITE_OTHER): Payer: Medicare Other | Admitting: Internal Medicine

## 2013-02-13 ENCOUNTER — Encounter: Payer: Self-pay | Admitting: Internal Medicine

## 2013-02-13 VITALS — BP 114/60 | HR 76 | Ht 70.0 in | Wt 196.0 lb

## 2013-02-13 DIAGNOSIS — K5900 Constipation, unspecified: Secondary | ICD-10-CM

## 2013-02-13 DIAGNOSIS — Z86010 Personal history of colonic polyps: Secondary | ICD-10-CM

## 2013-02-13 NOTE — Progress Notes (Signed)
HISTORY OF PRESENT ILLNESS:  Aaron Lucas is a 79 y.o. male with a history of hypertension, valvular heart disease, prostate cancer, atrial fibrillation on Pradaxa, and adenomatous colon polyps. He presents today regarding the need for surveillance colonoscopy. He is accompanied by his wife. He was recently seen by Dr. Jacky Kindle is suggested he may not need colonoscopy. Patient underwent index colonoscopy in June of 2006 with a small adenoma removed. Followup August 2011 with 3 small adenomas. Followup in 3 years if medically fit recommended. Also moderate left-sided diverticulosis and internal hemorrhoids. Atrial fibrillation and Pradaxa therapy is new since his last visit. Overall, he is doing well. No GI complaints except for occasional constipation for which he uses a laxative. No bleeding or other complaints. Recent blood work unremarkable. He is not interested in colonoscopy unless absolutely necessary.  REVIEW OF SYSTEMS:  All non-GI ROS negative except for unsteady gait  Past Medical History  Diagnosis Date  . Hypertension   . Cataracts, bilateral   . Valvular heart disease   . Mitral regurgitation   . Aortic stenosis   . Paroxysmal atrial fibrillation   . Vertigo   . Transitional cell carcinoma of bladder     recurrent  . Prostate cancer   . Hx of colonic polyps   . Dizziness   . Coronary artery disease   . Diverticulosis     Past Surgical History  Procedure Laterality Date  . Pleural effusion drainage  2010  . Radioactive seed implant    . Squamous cell carcinoma excision    . Thoracentesis      Social History Aaron Lucas  reports that he has never smoked. He has never used smokeless tobacco. He reports that he drinks alcohol. He reports that he does not use illicit drugs.  family history includes Other in an other family member; Other (age of onset: 9) in his father.  Allergies  Allergen Reactions  . Diamox [Acetazolamide]     unknown  . Neptazane  [Methazolamide]     unknown  . Ciprofloxacin Rash  . Doxycycline Rash  . Erythromycin Rash  . Penicillins Swelling and Rash       PHYSICAL EXAMINATION: Vital signs: BP 114/60  Pulse 76  Ht 5\' 10"  (1.778 m)  Wt 196 lb (88.905 kg)  BMI 28.12 kg/m2 General: Well-developed, well-nourished, no acute distress HEENT: Sclerae are anicteric, conjunctiva pink. Oral mucosa intact Lungs: Clear Heart: Regular with a 2/6 systolic murmur Abdomen: soft, nontender, nondistended, no obvious ascites, no peritoneal signs, normal bowel sounds. No organomegaly. Extremities: No edema Psychiatric: alert and oriented x3. Cooperative   ASSESSMENT:  #1. History of diminutive adenomas. Here today regarding the need for surveillance colonoscopy. He is 89 with recently significant medical issues. Asymptomatic without relevant lab abnormalities. At this point, I would not recommend routine surveillance colonoscopy. They understand and are appreciative #2. Functional constipation. Patient inquires wondering if occasional laxative use would be okay. I told him it would. #3. General medical problems in the care of Dr. Jacky Kindle. GI followup when necessary

## 2013-02-13 NOTE — Patient Instructions (Signed)
Please follow up as needed 

## 2013-03-22 ENCOUNTER — Telehealth: Payer: Self-pay | Admitting: Cardiology

## 2013-03-22 NOTE — Telephone Encounter (Signed)
New message    Can pt stop prodaxa for a few days.  They looked inside his bladder this am and he is back in office complaining of bleeding

## 2013-03-22 NOTE — Telephone Encounter (Signed)
Saw this call at 5:15 pm - office is closed.  Will forward to MD for review and call back tomorrow.

## 2013-03-28 NOTE — Telephone Encounter (Signed)
OK to hold Pradaxa as needed until bleeding resolves.

## 2013-03-28 NOTE — Telephone Encounter (Signed)
Pt held Pradaxa and restarted it Saturday.  Has had no more problems with bleeding

## 2013-07-24 ENCOUNTER — Other Ambulatory Visit: Payer: Self-pay | Admitting: Dermatology

## 2013-09-20 ENCOUNTER — Encounter: Payer: Self-pay | Admitting: Internal Medicine

## 2013-11-28 ENCOUNTER — Ambulatory Visit: Payer: Medicare Other | Admitting: Cardiology

## 2013-11-30 ENCOUNTER — Ambulatory Visit (INDEPENDENT_AMBULATORY_CARE_PROVIDER_SITE_OTHER): Payer: Medicare Other | Admitting: Cardiology

## 2013-11-30 ENCOUNTER — Encounter: Payer: Self-pay | Admitting: Cardiology

## 2013-11-30 VITALS — BP 130/70 | HR 65 | Ht 70.0 in | Wt 195.3 lb

## 2013-11-30 DIAGNOSIS — I10 Essential (primary) hypertension: Secondary | ICD-10-CM

## 2013-11-30 DIAGNOSIS — I340 Nonrheumatic mitral (valve) insufficiency: Secondary | ICD-10-CM

## 2013-11-30 DIAGNOSIS — I480 Paroxysmal atrial fibrillation: Secondary | ICD-10-CM

## 2013-11-30 DIAGNOSIS — I059 Rheumatic mitral valve disease, unspecified: Secondary | ICD-10-CM

## 2013-11-30 DIAGNOSIS — I4891 Unspecified atrial fibrillation: Secondary | ICD-10-CM

## 2013-11-30 NOTE — Patient Instructions (Signed)
Your physician recommends that you schedule a follow-up appointment in: one year with Dr. Percival Spanish  We are ordering an Echo

## 2013-11-30 NOTE — Progress Notes (Signed)
HPI The patient presents for follow of MR, AS and atrial fibrillation.     Since I last saw him he did have an episode of hematuria several months ago. He did hold his Pradaxa for a few days but then resumed it.  He's not had any recurrence of this.    He does have bladder cancer that he had his surveillance for and is due to have cystoscopy. He otherwise has done well. He exercises routinely. The patient denies any new symptoms such as chest discomfort, neck or arm discomfort. There has been no new shortness of breath, PND or orthopnea. There have been no reported palpitations, presyncope or syncope.   Allergies  Allergen Reactions  . Diamox [Acetazolamide]     unknown  . Neptazane [Methazolamide]     unknown  . Ciprofloxacin Rash  . Doxycycline Rash  . Erythromycin Rash  . Penicillins Swelling and Rash    Current Outpatient Prescriptions  Medication Sig Dispense Refill  . acetaminophen (TYLENOL) 500 MG chewable tablet Chew 1,000 mg by mouth once. As needed      . clindamycin (CLEOCIN-T) 1 % gel Apply 1 application topically 2 (two) times daily.        . dabigatran (PRADAXA) 150 MG CAPS Take 150 mg by mouth every 12 (twelve) hours.        . hydrochlorothiazide (HYDRODIURIL) 25 MG tablet Take 25 mg by mouth daily.      Marland Kitchen lisinopril (PRINIVIL,ZESTRIL) 40 MG tablet Take 40 mg by mouth daily.      . metroNIDAZOLE (METROCREAM) 0.75 % cream Apply 1 application topically 2 (two) times daily as needed.        No current facility-administered medications for this visit.    Past Medical History  Diagnosis Date  . Hypertension   . Cataracts, bilateral   . Valvular heart disease   . Mitral regurgitation   . Aortic stenosis   . Paroxysmal atrial fibrillation   . Vertigo   . Transitional cell carcinoma of bladder     recurrent  . Prostate cancer   . Hx of colonic polyps   . Dizziness   . Coronary artery disease   . Diverticulosis     Past Surgical History  Procedure Laterality  Date  . Pleural effusion drainage  2010  . Radioactive seed implant    . Squamous cell carcinoma excision    . Thoracentesis      ROS:  As stated in the HPI and negative for all other systems.  PHYSICAL EXAM BP 130/70  Ht 5\' 10"  (1.778 m)  Wt 195 lb 4.8 oz (88.587 kg)  BMI 28.02 kg/m2 GENERAL:  Well appearing HEENT:  Fundi not visualized, oral mucosa unremarkable, disconjugate gaze with anisocoria.   NECK:  No jugular venous distention, waveform within normal limits, carotid upstroke brisk and symmetric, no bruits, no thyromegaly LUNGS:  Clear to auscultation bilaterally CHEST:  Unremarkable HEART:  PMI not displaced or sustained,S1 and S2 within normal limits, no S3, no S4, no clicks, no rubs, apical holosystolic murmur and murmur radiating out the aortic outflow tract. ABD:  Flat, positive bowel sounds normal in frequency in pitch, no bruits, no rebound, no guarding, no midline pulsatile mass, no hepatomegaly, no splenomegaly, varicose veins. EXT:  2 plus pulses throughout, no edema, no cyanosis no clubbing  EKG:  Sinus rhythm, rate 65, axis within normal limits, intervals within normal limits, no acute ST-T wave changes.  11/30/2013  ASSESSMENT AND PLAN  Paroxysmal atrial fibrillation -  He has had no symptomatic paroxysms. He tolerates the Pradaxa and he will continue with this.  We will discuss this if he has any recurrent urinary bleeding or problems elsewhere  Mitral regurgitation I will check this again with the echo below  Aortic stenosis -  There is no historical evidence to suggest that this has progressed.  However, I will check an echo.    Hypertension -  The blood pressure is at target. No change in medications is indicated.

## 2013-12-05 ENCOUNTER — Ambulatory Visit (HOSPITAL_COMMUNITY)
Admission: RE | Admit: 2013-12-05 | Discharge: 2013-12-05 | Disposition: A | Discharge status: Home / Self Care | Payer: Medicare Other | Source: Ambulatory Visit | Attending: Cardiology | Admitting: Cardiology

## 2013-12-05 DIAGNOSIS — I359 Nonrheumatic aortic valve disorder, unspecified: Secondary | ICD-10-CM

## 2013-12-05 DIAGNOSIS — I340 Nonrheumatic mitral (valve) insufficiency: Secondary | ICD-10-CM

## 2013-12-05 DIAGNOSIS — I080 Rheumatic disorders of both mitral and aortic valves: Secondary | ICD-10-CM | POA: Diagnosis not present

## 2013-12-05 DIAGNOSIS — I480 Paroxysmal atrial fibrillation: Secondary | ICD-10-CM

## 2013-12-05 DIAGNOSIS — I10 Essential (primary) hypertension: Secondary | ICD-10-CM

## 2013-12-05 NOTE — Progress Notes (Signed)
2D Echo Performed 12/05/2013    Marygrace Drought, RCS

## 2013-12-29 ENCOUNTER — Other Ambulatory Visit: Payer: Self-pay | Admitting: Dermatology

## 2014-09-12 ENCOUNTER — Encounter: Payer: Self-pay | Admitting: Internal Medicine

## 2014-11-12 ENCOUNTER — Encounter (HOSPITAL_BASED_OUTPATIENT_CLINIC_OR_DEPARTMENT_OTHER): Payer: Self-pay | Admitting: *Deleted

## 2014-11-12 ENCOUNTER — Observation Stay (HOSPITAL_BASED_OUTPATIENT_CLINIC_OR_DEPARTMENT_OTHER)
Admission: EM | Admit: 2014-11-12 | Discharge: 2014-11-15 | Disposition: A | Discharge status: Home / Self Care | Payer: Medicare Other | Attending: Internal Medicine | Admitting: Internal Medicine

## 2014-11-12 DIAGNOSIS — K5730 Diverticulosis of large intestine without perforation or abscess without bleeding: Secondary | ICD-10-CM | POA: Insufficient documentation

## 2014-11-12 DIAGNOSIS — K648 Other hemorrhoids: Secondary | ICD-10-CM | POA: Insufficient documentation

## 2014-11-12 DIAGNOSIS — Z86010 Personal history of colonic polyps: Secondary | ICD-10-CM | POA: Diagnosis not present

## 2014-11-12 DIAGNOSIS — Z8551 Personal history of malignant neoplasm of bladder: Secondary | ICD-10-CM | POA: Diagnosis not present

## 2014-11-12 DIAGNOSIS — I340 Nonrheumatic mitral (valve) insufficiency: Secondary | ICD-10-CM | POA: Diagnosis present

## 2014-11-12 DIAGNOSIS — K921 Melena: Principal | ICD-10-CM | POA: Insufficient documentation

## 2014-11-12 DIAGNOSIS — E871 Hypo-osmolality and hyponatremia: Secondary | ICD-10-CM | POA: Insufficient documentation

## 2014-11-12 DIAGNOSIS — I080 Rheumatic disorders of both mitral and aortic valves: Secondary | ICD-10-CM | POA: Insufficient documentation

## 2014-11-12 DIAGNOSIS — I2510 Atherosclerotic heart disease of native coronary artery without angina pectoris: Secondary | ICD-10-CM | POA: Insufficient documentation

## 2014-11-12 DIAGNOSIS — R42 Dizziness and giddiness: Secondary | ICD-10-CM | POA: Diagnosis not present

## 2014-11-12 DIAGNOSIS — Z8546 Personal history of malignant neoplasm of prostate: Secondary | ICD-10-CM | POA: Insufficient documentation

## 2014-11-12 DIAGNOSIS — Z7901 Long term (current) use of anticoagulants: Secondary | ICD-10-CM | POA: Diagnosis not present

## 2014-11-12 DIAGNOSIS — D696 Thrombocytopenia, unspecified: Secondary | ICD-10-CM | POA: Diagnosis not present

## 2014-11-12 DIAGNOSIS — Z881 Allergy status to other antibiotic agents status: Secondary | ICD-10-CM | POA: Insufficient documentation

## 2014-11-12 DIAGNOSIS — Z79899 Other long term (current) drug therapy: Secondary | ICD-10-CM | POA: Insufficient documentation

## 2014-11-12 DIAGNOSIS — K922 Gastrointestinal hemorrhage, unspecified: Secondary | ICD-10-CM

## 2014-11-12 DIAGNOSIS — I480 Paroxysmal atrial fibrillation: Secondary | ICD-10-CM | POA: Diagnosis not present

## 2014-11-12 DIAGNOSIS — I10 Essential (primary) hypertension: Secondary | ICD-10-CM | POA: Diagnosis not present

## 2014-11-12 DIAGNOSIS — Z66 Do not resuscitate: Secondary | ICD-10-CM | POA: Diagnosis not present

## 2014-11-12 DIAGNOSIS — D62 Acute posthemorrhagic anemia: Secondary | ICD-10-CM | POA: Insufficient documentation

## 2014-11-12 DIAGNOSIS — Z880 Allergy status to penicillin: Secondary | ICD-10-CM | POA: Insufficient documentation

## 2014-11-12 DIAGNOSIS — D649 Anemia, unspecified: Secondary | ICD-10-CM | POA: Diagnosis not present

## 2014-11-12 DIAGNOSIS — Z888 Allergy status to other drugs, medicaments and biological substances status: Secondary | ICD-10-CM | POA: Diagnosis not present

## 2014-11-12 HISTORY — DX: Serous retinal detachment, unspecified eye: H33.20

## 2014-11-12 LAB — CBC
HCT: 33.6 % — ABNORMAL LOW (ref 39.0–52.0)
HEMOGLOBIN: 11.1 g/dL — AB (ref 13.0–17.0)
MCH: 31 pg (ref 26.0–34.0)
MCHC: 33 g/dL (ref 30.0–36.0)
MCV: 93.9 fL (ref 78.0–100.0)
Platelets: 135 10*3/uL — ABNORMAL LOW (ref 150–400)
RBC: 3.58 MIL/uL — ABNORMAL LOW (ref 4.22–5.81)
RDW: 12.6 % (ref 11.5–15.5)
WBC: 6.1 10*3/uL (ref 4.0–10.5)

## 2014-11-12 LAB — PROTIME-INR
INR: 1.54 — ABNORMAL HIGH (ref 0.00–1.49)
Prothrombin Time: 18.5 seconds — ABNORMAL HIGH (ref 11.6–15.2)

## 2014-11-12 LAB — I-STAT CHEM 8, ED
BUN: 16 mg/dL (ref 6–20)
Calcium, Ion: 1.19 mmol/L (ref 1.13–1.30)
Chloride: 94 mmol/L — ABNORMAL LOW (ref 101–111)
Creatinine, Ser: 0.8 mg/dL (ref 0.61–1.24)
GLUCOSE: 108 mg/dL — AB (ref 65–99)
HCT: 34 % — ABNORMAL LOW (ref 39.0–52.0)
HEMOGLOBIN: 11.6 g/dL — AB (ref 13.0–17.0)
Potassium: 4 mmol/L (ref 3.5–5.1)
SODIUM: 132 mmol/L — AB (ref 135–145)
TCO2: 24 mmol/L (ref 0–100)

## 2014-11-12 LAB — OCCULT BLOOD X 1 CARD TO LAB, STOOL: Fecal Occult Bld: POSITIVE — AB

## 2014-11-12 NOTE — ED Notes (Signed)
Black stools x 2 since this afternoon. No pain. No vomiting.

## 2014-11-12 NOTE — ED Provider Notes (Signed)
CSN: [id number]     Arrival date & time 11/12/14  2222 History  This chart was scribed for [name], MD by Meriel Pica, ED Scribe. This patient was seen in room MH11/MH11 and the patient's care was started 11:00 PM.  Chief Complaint  Patient presents with  . GI Bleeding   HPI HPI Comments: Aaron Lucas is a 81 y.o. male, with a PMhx of HTN, Afib, and diverticulosis, who presents to the Emergency Department complaining of 2 episodes of sudden onset dark stool that began 5 hours ago. He additionally notes a yellow-urine color to 1 BM this morning. Pt reports he is on Pradaxa 150mg  for Afib and is concerned of a GI bleed. His last colonoscopy was over 3 years ago when he had several benign polyps removed. Denies abdominal pain, nausea, vomiting, or hematuria. Additionally denies a PMhx of colon cancer. No prior hx of GI bleed.  Sees Dr. Henrene Pastor with GI.  Past Medical History  Diagnosis Date  . Hypertension   . Cataracts, bilateral   . Valvular heart disease   . Mitral regurgitation   . Aortic stenosis   . Paroxysmal atrial fibrillation   . Vertigo   . Transitional cell carcinoma of bladder     recurrent  . Prostate cancer   . Hx of colonic polyps   . Dizziness   . Coronary artery disease   . Diverticulosis    Past Surgical History  Procedure Laterality Date  . Pleural effusion drainage  2010  . Radioactive seed implant    . Squamous cell carcinoma excision    . Thoracentesis     Family History  Problem Relation Age of Onset  . Other Father 31     of a PE after surgery  . Other       there is no known coronary artery disease    History  Substance Use Topics  . Smoking status: Never Smoker   . Smokeless tobacco: Never Used  . Alcohol Use: Yes     Comment: occassionally    Review of Systems  Gastrointestinal: Negative for nausea, vomiting and abdominal pain.       Black stool x2.  Genitourinary: Negative for hematuria.  All other systems reviewed and are  negative.  Allergies  Diamox; Neptazane; Ciprofloxacin; Doxycycline; Erythromycin; and Penicillins  Home Medications   Prior to Admission medications   Medication Sig Start Date End Date Taking? Authorizing Provider  acetaminophen (TYLENOL) 500 MG chewable tablet Chew 1,000 mg by mouth once. As needed    Historical Provider, MD  clindamycin (CLEOCIN-T) 1 % gel Apply 1 application topically 2 (two) times daily.      Historical Provider, MD  dabigatran (PRADAXA) 150 MG CAPS Take 150 mg by mouth every 12 (twelve) hours.      Historical Provider, MD  hydrochlorothiazide (HYDRODIURIL) 25 MG tablet Take 25 mg by mouth daily. 11/19/13   Historical Provider, MD  lisinopril (PRINIVIL,ZESTRIL) 40 MG tablet Take 40 mg by mouth daily. 11/19/13   Historical Provider, MD  metroNIDAZOLE (METROCREAM) 0.75 % cream Apply 1 application topically 2 (two) times daily as needed.     Historical Provider, MD   BP 145/59 mmHg  Pulse 84  Temp(Src) 98.3 F (36.8 C) (Oral)  Resp 20  Ht 5\' 10"  (1.778 m)  Wt 195 lb (88.451 kg)  BMI 27.98 kg/m2  SpO2 100% Physical Exam  Constitutional: He appears well-developed and well-nourished. No distress.  HENT:  Head: Normocephalic and atraumatic.  Eyes: Conjunctivae are normal. Right eye exhibits no discharge. Left eye exhibits no discharge. No scleral icterus.  Neck: No JVD present.  Cardiovascular: Normal rate and regular rhythm.   Murmur heard.  Systolic murmur is present with a grade of 2/6  Pulmonary/Chest: Effort normal and breath sounds normal. No respiratory distress.  Abdominal: Soft. Bowel sounds are normal. He exhibits no distension and no mass. There is no tenderness. There is no rebound and no guarding.  Genitourinary:  Rectal exam normal; no gross blood; black stool.   Neurological: He is alert. Coordination normal.  Skin: Skin is warm. No rash noted. No erythema. No pallor.  Psychiatric: He has a normal mood and affect. His behavior is normal.  Nursing  note and vitals reviewed.   ED Course  Procedures  DIAGNOSTIC STUDIES: Oxygen Saturation is 100% on RA, normal by my interpretation.    COORDINATION OF CARE: 11:05 PM Discussed treatment plan which includes to perform a rectal exam with pt. Pt acknowledges and agrees to plan.   Labs Review Labs Reviewed  CBC - Abnormal; Notable for the following:    RBC 3.58 (*)    Hemoglobin 11.1 (*)    HCT 33.6 (*)    Platelets 135 (*)    All other components within normal limits  PROTIME-INR - Abnormal; Notable for the following:    Prothrombin Time 18.5 (*)    INR 1.54 (*)    All other components within normal limits  OCCULT BLOOD X 1 CARD TO LAB, STOOL - Abnormal; Notable for the following:    Fecal Occult Bld POSITIVE (*)    All other components within normal limits  I-STAT CHEM 8, ED - Abnormal; Notable for the following:    Sodium 132 (*)    Chloride 94 (*)    Glucose, Bld 108 (*)    Hemoglobin 11.6 (*)    HCT 34.0 (*)    All other components within normal limits  COMPREHENSIVE METABOLIC PANEL    Imaging Review No results found.   EKG Interpretation None      MDM   Final diagnoses:  Lower GI bleeding    Presenting with 2 episodes of dark stools starting at 6 PM tonight. He does take Pradaxa for A. fib. He denies any abdominal pain, weakness, shortness of breath, chest pain. He otherwise feels his normal self. On exam patient has no abdominal pain but does have black stool with no gross blood.  Hemoglobin is stable at 11.6. Heme occult positive and INR slightly elevated at 1.5.  Blood pressure has been stable here and patient is not tachycardic. Because patient is on blood thinners will admit for further observation  I personally performed the services described in this documentation, which was scribed in my presence.  The recorded information has been reviewed and considered.    [name], MD 11/13/14 [phone]

## 2014-11-13 ENCOUNTER — Encounter (HOSPITAL_COMMUNITY): Payer: Self-pay | Admitting: *Deleted

## 2014-11-13 DIAGNOSIS — I340 Nonrheumatic mitral (valve) insufficiency: Secondary | ICD-10-CM | POA: Diagnosis not present

## 2014-11-13 DIAGNOSIS — K921 Melena: Secondary | ICD-10-CM | POA: Diagnosis present

## 2014-11-13 DIAGNOSIS — I10 Essential (primary) hypertension: Secondary | ICD-10-CM | POA: Diagnosis not present

## 2014-11-13 DIAGNOSIS — I480 Paroxysmal atrial fibrillation: Secondary | ICD-10-CM

## 2014-11-13 DIAGNOSIS — K922 Gastrointestinal hemorrhage, unspecified: Secondary | ICD-10-CM | POA: Insufficient documentation

## 2014-11-13 LAB — COMPREHENSIVE METABOLIC PANEL
ALBUMIN: 3.9 g/dL (ref 3.5–5.0)
ALK PHOS: 43 U/L (ref 38–126)
ALT: 14 U/L — AB (ref 17–63)
ALT: 14 U/L — ABNORMAL LOW (ref 17–63)
AST: 24 U/L (ref 15–41)
AST: 22 U/L (ref 15–41)
Albumin: 3.6 g/dL (ref 3.5–5.0)
Alkaline Phosphatase: 40 U/L (ref 38–126)
Anion gap: 5 (ref 5–15)
Anion gap: 8 (ref 5–15)
BILIRUBIN TOTAL: 0.9 mg/dL (ref 0.3–1.2)
BUN: 13 mg/dL (ref 6–20)
BUN: 12 mg/dL (ref 6–20)
CALCIUM: 8.7 mg/dL — AB (ref 8.9–10.3)
CHLORIDE: 96 mmol/L — AB (ref 101–111)
CO2: 27 mmol/L (ref 22–32)
CO2: 27 mmol/L (ref 22–32)
CREATININE: 0.77 mg/dL (ref 0.61–1.24)
CREATININE: 0.71 mg/dL (ref 0.61–1.24)
Calcium: 8.6 mg/dL — ABNORMAL LOW (ref 8.9–10.3)
Chloride: 96 mmol/L — ABNORMAL LOW (ref 101–111)
GFR calc Af Amer: >60 mL/min (ref >60)
GFR calc non Af Amer: >60 mL/min (ref >60)
GFR calc non Af Amer: >60 mL/min (ref >60)
GLUCOSE: 105 mg/dL — AB (ref 65–99)
GLUCOSE: 107 mg/dL — AB (ref 65–99)
Potassium: 3.9 mmol/L (ref 3.5–5.1)
Potassium: 3.6 mmol/L (ref 3.5–5.1)
Sodium: 128 mmol/L — ABNORMAL LOW (ref 135–145)
Sodium: 131 mmol/L — ABNORMAL LOW (ref 135–145)
Total Bilirubin: 1 mg/dL (ref 0.3–1.2)
Total Protein: 6.3 g/dL — ABNORMAL LOW (ref 6.5–8.1)
Total Protein: 6.1 g/dL — ABNORMAL LOW (ref 6.5–8.1)

## 2014-11-13 LAB — TYPE AND SCREEN
ABO/RH(D): O POS
Antibody Screen: NEGATIVE

## 2014-11-13 LAB — PROTIME-INR
INR: 1.41 (ref 0.00–1.49)
PROTHROMBIN TIME: 17.4 s — AB (ref 11.6–15.2)

## 2014-11-13 LAB — CBC WITH DIFFERENTIAL/PLATELET
Basophils Absolute: 0 10*3/uL (ref 0.0–0.1)
Basophils Relative: 0 % (ref 0–1)
EOS ABS: 0.1 10*3/uL (ref 0.0–0.7)
Eosinophils Relative: 1 % (ref 0–5)
HEMATOCRIT: 32.7 % — AB (ref 39.0–52.0)
Hemoglobin: 11 g/dL — ABNORMAL LOW (ref 13.0–17.0)
LYMPHS ABS: 1.4 10*3/uL (ref 0.7–4.0)
Lymphocytes Relative: 26 % (ref 12–46)
MCH: 30.6 pg (ref 26.0–34.0)
MCHC: 33.6 g/dL (ref 30.0–36.0)
MCV: 90.8 fL (ref 78.0–100.0)
MONO ABS: 0.5 10*3/uL (ref 0.1–1.0)
MONOS PCT: 9 % (ref 3–12)
Neutro Abs: 3.6 10*3/uL (ref 1.7–7.7)
Neutrophils Relative %: 64 % (ref 43–77)
PLATELETS: 134 10*3/uL — AB (ref 150–400)
RBC: 3.6 MIL/uL — AB (ref 4.22–5.81)
RDW: 12.9 % (ref 11.5–15.5)
WBC: 5.6 10*3/uL (ref 4.0–10.5)

## 2014-11-13 LAB — CBC
HCT: 31.1 % — ABNORMAL LOW (ref 39.0–52.0)
Hemoglobin: 10.6 g/dL — ABNORMAL LOW (ref 13.0–17.0)
MCH: 30.8 pg (ref 26.0–34.0)
MCHC: 34.1 g/dL (ref 30.0–36.0)
MCV: 90.4 fL (ref 78.0–100.0)
PLATELETS: 138 10*3/uL — AB (ref 150–400)
RBC: 3.44 MIL/uL — AB (ref 4.22–5.81)
RDW: 12.8 % (ref 11.5–15.5)
WBC: 6.1 10*3/uL (ref 4.0–10.5)

## 2014-11-13 LAB — ABO/RH: ABO/RH(D): O POS

## 2014-11-13 LAB — RETICULOCYTES
RBC.: 3.6 MIL/uL — ABNORMAL LOW (ref 4.22–5.81)
RETIC COUNT ABSOLUTE: 50.4 10*3/uL (ref 19.0–186.0)
Retic Ct Pct: 1.4 % (ref 0.4–3.1)

## 2014-11-13 MED ORDER — ACETAMINOPHEN 325 MG PO TABS: 650.0000 mg | ORAL_TABLET | Freq: Four times a day (QID) | ORAL | Status: DC | PRN

## 2014-11-13 MED ORDER — ONDANSETRON HCL 4 MG/2ML IJ SOLN: 4.0000 mg | Freq: Four times a day (QID) | INTRAMUSCULAR | Status: DC | PRN

## 2014-11-13 MED ORDER — PANTOPRAZOLE SODIUM 40 MG IV SOLR
40.0000 mg | Freq: Two times a day (BID) | INTRAVENOUS | Status: DC
  Administered 2014-11-13 (×2): 40 mg via INTRAVENOUS
  Filled 2014-11-13 (×3): qty 40

## 2014-11-13 MED ORDER — ONDANSETRON HCL 4 MG PO TABS: 4.0000 mg | ORAL_TABLET | Freq: Four times a day (QID) | ORAL | Status: DC | PRN

## 2014-11-13 MED ORDER — SODIUM CHLORIDE 0.9 % IJ SOLN
3.0000 mL | Freq: Two times a day (BID) | INTRAMUSCULAR | Status: DC
  Administered 2014-11-13 – 2014-11-14 (×4): 3 mL via INTRAVENOUS

## 2014-11-13 MED ORDER — PANTOPRAZOLE SODIUM 40 MG PO TBEC
40.0000 mg | DELAYED_RELEASE_TABLET | Freq: Every day | ORAL | Status: DC
  Administered 2014-11-14 – 2014-11-15 (×2): 40 mg via ORAL
  Filled 2014-11-13: qty 1

## 2014-11-13 MED ORDER — LISINOPRIL 40 MG PO TABS
40.0000 mg | ORAL_TABLET | Freq: Every day | ORAL | Status: DC
  Administered 2014-11-13 – 2014-11-15 (×3): 40 mg via ORAL
  Filled 2014-11-13 (×3): qty 1

## 2014-11-13 MED ORDER — ACETAMINOPHEN 650 MG RE SUPP: 650.0000 mg | Freq: Four times a day (QID) | RECTAL | Status: DC | PRN

## 2014-11-13 NOTE — Consult Note (Signed)
[facility] Consult: 12:50 PM 11/13/2014  LOS: 0 days    Referring Provider: Dr Dyann Kief  Primary Care Physician:  Geoffery Lyons, MD Primary Gastroenterologist:  Dr. Henrene Pastor   Reason for Consultation:  Black, loose stool.    HPI: Aaron Lucas is a 81 y.o. male.  Hx a fib, on Pradaxa.  Chad/vasc 4.  Aortic stenosis, mitral regurge. Hx bladder cancer.   11/2009 Colonoscopy.  Recall surveillance of tubular adenomatous polyps 09/2004.  Dr Henrene Pastor.  5 polyps (tubular adenomas) removed. Left colon tics, int 'rrhoids.  Rec: Follow up colonoscopy in 3 years if 3 or more adenomas, otherwise 5 years  Patient met with Dr. Henrene Pastor in November 2014.  MD recommended not pursuing routine surveillance colonoscopy, given patient's comorbidities and lack of concerning lab abnormalities.    Beginning over the weekend, the patient developed dark but formed stools. He had maybe 1 or 2 a day. Did not see any fresh blood. There's been no abdominal pain, no loss of appetite, no nausea/vomiting, no weight loss. Other than the Pradaxa, the patient does not take any other "thinners" and does not consume any NSAIDs.  He drinks about 4 glasses of wine per week, sometimes substitutes a single beer in place of a glass of wine. On rectal exam there was black, formed stool which tested FOBT positive. Hgb is 11.  MCV 90. Platelets 134.  Coags 17.4/1.4.   BUN is not elevated.    Past Medical History  Diagnosis Date  . Hypertension   . Cataracts, bilateral   . Valvular heart disease     MV regurge and Ao stenosis  . Paroxysmal atrial fibrillation     On Pradaxa.   . Vertigo   . Transitional cell carcinoma of bladder     recurrent  . Prostate cancer 2004  . Hx of colonic polyps 2006,  2011  . Coronary artery disease   . Diverticulosis 2006    . Detached retina Twice    As a result he is lost vision in the left eye.    Past Surgical History  Procedure Laterality Date  . Pleural effusion drainage  2010  . Radioactive seed implant  2004    For treatment of prostate cancer  . Squamous cell carcinoma excision  2002, 2003, 2006.  Marland Kitchen Thoracentesis    . Colonoscopy  2006 and 11/2009    tubular adenomas on both studies. int rrhoids, diverticulosis.   . 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 Focal urothelial atypia in 2006.  . Cataract extraction      Prior to Admission medications   Medication Sig Start Date End Date Taking? Authorizing Provider  acetaminophen (TYLENOL) 500 MG chewable tablet Chew 1,000 mg by mouth once. As needed   Yes Historical Provider, MD  Calcium Polycarbophil (FIBER) 625 MG TABS Take 1 tablet by mouth daily.   Yes Historical Provider, MD  clindamycin (CLEOCIN-T) 1 % gel Apply 1 application topically 2 (two) times daily.  Yes Historical Provider, MD  dabigatran (PRADAXA) 150 MG CAPS Take 150 mg by mouth every 12 (twelve) hours.     Yes Historical Provider, MD  hydrochlorothiazide (HYDRODIURIL) 25 MG tablet Take 25 mg by mouth daily. 11/19/13  Yes Historical Provider, MD  lisinopril (PRINIVIL,ZESTRIL) 40 MG tablet Take 40 mg by mouth daily. 11/19/13  Yes Historical Provider, MD  loratadine (CLARITIN) 10 MG tablet Take 10 mg by mouth daily.   Yes Historical Provider, MD  metroNIDAZOLE (METROCREAM) 0.75 % cream Apply 1 application topically 2 (two) times daily as needed.    Yes Historical Provider, MD  Multiple Vitamins-Minerals (MULTIVITAMIN WITH MINERALS) tablet Take 1 tablet by mouth daily.   Yes Historical Provider, MD  Omega-3 Fatty Acids (FISH OIL PO) Take 2 capsules by mouth daily.   Yes Historical Provider, MD  triamcinolone (NASACORT ALLERGY 24HR) 55 MCG/ACT AERO nasal inhaler Place 1 spray into both nostrils daily.   Yes  Historical Provider, MD  vitamin C (ASCORBIC ACID) 500 MG tablet Take 500 mg by mouth daily.   Yes Historical Provider, MD    Scheduled Meds: . lisinopril  40 mg Oral Daily  . [START ON 11/14/2014] pantoprazole  40 mg Oral Q0600  . sodium chloride  3 mL Intravenous Q12H   Infusions:   PRN Meds: acetaminophen **OR** acetaminophen, ondansetron **OR** ondansetron (ZOFRAN) IV   Allergies as of 11/12/2014 - Review Complete 11/12/2014  Allergen Reaction Noted  . Diamox [acetazolamide]  01/28/2011  . Neptazane [methazolamide]  01/28/2011  . Ciprofloxacin Rash 11/13/2009  . Doxycycline Rash 11/13/2009  . Erythromycin Rash 11/13/2009  . Penicillins Swelling and Rash 11/13/2009    Family History  Problem Relation Age of Onset  . Other Father 35     of a PE after surgery  . Other       there is no known coronary artery disease     History   Social History  . Marital Status: Married    Spouse Name: N/A  . Number of Children: 1  . Years of Education: N/A   Occupational History  . retired    Social History Main Topics  . Smoking status: Never Smoker   . Smokeless tobacco: Never Used  . Alcohol Use: Yes     Comment: occassionally  . Drug Use: No  . Sexual Activity: Not on file   Other Topics Concern  . Not on file   Social History Narrative    REVIEW OF SYSTEMS: Constitutional:  No weakness, no weight loss. ENT:  No nose bleeds Pulm:  No DOE, no PND, no cough. CV:  No palpitations, no LE edema. No chest pain GU:  No hematuria, no frequency GI:  Per HPI Heme:  No issues with excessive bleeding or exuberant bruising.   Transfusions:  None ever Neuro:  No headaches, no peripheral tingling or numbness.  Vision loss on the left. Wears contact lens on the right during the day or wears glasses. Derm:  No itching, no rash or sores.  Endocrine:  No sweats or chills.  No polyuria or dysuria Immunization: Not queried. Travel:  None beyond local counties in last few months.     PHYSICAL EXAM: Vital signs in last 24 hours: Filed Vitals:   11/13/14 1132  BP: 123/69  Pulse: 70  Temp:   Resp:    Wt Readings from Last 3 Encounters:  11/13/14 191 lb 3.2 oz (86.728 kg)  11/30/13 195 lb 4.8 oz (88.587 kg)  02/13/13 196 lb (88.905  kg)    General: Pleasant, elderly, somewhat frail-appearing WM. Head:  No swelling, no asymmetry, no signs of head trauma.  Eyes:  No scleral icterus, no conjunctival pallor. Disconjugate gaze on the right Ears:  Slight hearing loss, not dramatic.  Nose:  No congestion or discharge Mouth:  Clear, moist. Native dentition in good repair. Neck:  No JVD, no masses, no thyromegaly. Lungs:  Clear to auscultation and percussion bilaterally. Breathing is unlabored. No cough. Heart: RRR. 2/6 harsh systolic murmur. PMI not displaced. Abdomen:  Soft, NT, ND, no HSM, no bruits, no hernias.  activ BS.Marland Kitchen   Rectal: Deferred. Stool was black, formed and FOBT positive in the emergency room.   Musc/Skeltl: No joint deformity, swelling or redness. Some arthritic changes in the hands/fingers. Extremities:  No CCE. Feet are warm and well perfused.  Neurologic:  Patient oriented 3. Appropriate. No tremor, no limb weakness. Skin:  No rash, no telangiectasia, no sores. Tattoos:  None Nodes:  No cervical or inguinal adenopathy.   Psych:  Relaxed, pleasant.  Intake/Output from previous day: 08/01 0701 - 08/02 0700 In: -  Out: 350 [Urine:350] Intake/Output this shift: Total I/O In: 924 [P.O.:924] Out: 1 [Stool:1]  LAB RESULTS:  Recent Labs  11/12/14 2253 11/12/14 2257 11/13/14 0350  WBC 6.1  --  5.6  HGB 11.1* 11.6* 11.0*  HCT 33.6* 34.0* 32.7*  PLT 135*  --  134*   BMET Lab Results  Component Value Date   NA 131* 11/13/2014   NA 132* 11/12/2014   NA 128* 11/12/2014   K 3.6 11/13/2014   K 4.0 11/12/2014   K 3.9 11/12/2014   CL 96* 11/13/2014   CL 94* 11/12/2014   CL 96* 11/12/2014   CO2 27 11/13/2014   CO2 27 11/12/2014    CO2 30 11/01/2010   GLUCOSE 107* 11/13/2014   GLUCOSE 108* 11/12/2014   GLUCOSE 105* 11/12/2014   BUN 12 11/13/2014   BUN 16 11/12/2014   BUN 13 11/12/2014   CREATININE 0.71 11/13/2014   CREATININE 0.80 11/12/2014   CREATININE 0.77 11/12/2014   CALCIUM 8.6* 11/13/2014   CALCIUM 8.7* 11/12/2014   CALCIUM 8.7 11/01/2010   LFT  Recent Labs  11/12/14 2253 11/13/14 0350  PROT 6.3* 6.1*  ALBUMIN 3.9 3.6  AST 24 22  ALT 14* 14*  ALKPHOS 43 40  BILITOT 0.9 1.0   PT/INR Lab Results  Component Value Date   INR 1.41 11/13/2014   INR 1.54* 11/12/2014     RADIOLOGY STUDIES: No results found.  ENDOSCOPIC STUDIES: Per HPI  IMPRESSION:   *  GI bleed.  Painless, no accompanying GI sxs. Although the BUN is normal, suspect this is a upper GI tract source. Question ulcer disease, question AVMs.  *  Hx tubular adenomatous colon polyps 2006 and 2011. Dr. Henrene Pastor had felt patient did not require repeat surveillance colonoscopies as of office visit 02/2013  *  Thrombocytopenia.  Non-critical.   *  Hyponatremia.  *  Chronic Pradaxa for PAF.  Last dose PM of 8/1. Note for CrCl 50-30 ml/min - stop 48 hours prior to the procedure, if high risk for bleeding stop for 4 days. CrCl < 30 - hold drug for 2 - 5 days and > 5 days for high risk bleeding. In patients at high risk of bleeding a thrombin time can be checked 6 - 12 hours prior to surgery - if normal there is no dabigatran present   PLAN:     *  Any endoscopies need to wait until soonest 8/4 at which point the Pradaxa will have sufficiently cleared.  Has slot for EGD and colonoscopy 9AM on 8/4  *  Okay to consume clear trays, might even be okay to eat solid foods.  Follow CBC every 12 hours. I switched the IV, twice a day, Protonix to once daily oral Protonix.  Azucena Freed  11/13/2014, 12:50 PM Pager: [phone]     ________________________________________________________________________  Aaron Lucas GI MD note:  I personally  examined the patient, reviewed the data and agree with the assessment and plan described above.  He's had melena but BUN normal. ADenomatous polyps in the past. Planning for colonoscopy/EGD on Thursday to allow washout of pradaxa.     Aaron Loffler, MD [facility] Pager [phone]

## 2014-11-13 NOTE — H&P (Signed)
[facility] History and Physical  Patient: Aaron Lucas  MRN: [id number]  DOB: 06-08-1928  DOS: the patient was seen and examined on 11/13/2014 PCP: Geoffery Lyons, MD  Referring physician: Dr. Theora Gianotti Chief Complaint: Diarrhea with black color stool  HPI: Aaron Lucas is a 81 y.o. male with Past medical history of aortic stenosis and mitral regurgitation atrial fibrillation coronary artery disease on chronic anticoagulation, CHADVASC 4. The patient is presenting with complaints of  T Black color bowel movement that happened 3 times today. The patient did not have any pain. Denies any nausea or vomiting or abdominal pain or acid reflux. Denies any fever or chills or diarrhea otherwise. Denies any active bleeding anywhere else. Denies any change in his medication. Denies having a similar h bleeding in the past.  the patient is coming from home.  At his baseline ambulates without any support And is independent for most of his ADL manages her medication on his own.  Review of Systems: as mentioned in the history of present illness.  A comprehensive review of the other systems is negative.  Past Medical History  Diagnosis Date  . Hypertension   . Cataracts, bilateral   . Valvular heart disease   . Mitral regurgitation   . Aortic stenosis   . Paroxysmal atrial fibrillation   . Vertigo   . Transitional cell carcinoma of bladder     recurrent  . Prostate cancer   . Hx of colonic polyps   . Dizziness   . Coronary artery disease   . Diverticulosis    Past Surgical History  Procedure Laterality Date  . Pleural effusion drainage  2010  . Radioactive seed implant    . Squamous cell carcinoma excision    . Thoracentesis     Social History:  reports that he has never smoked. He has never used smokeless tobacco. He reports that he drinks alcohol. He reports that he does not use illicit drugs.  Allergies  Allergen Reactions  . Diamox [Acetazolamide]     unknown    . Neptazane [Methazolamide]     unknown  . Ciprofloxacin Rash  . Doxycycline Rash  . Erythromycin Rash  . Penicillins Swelling and Rash    Family History  Problem Relation Age of Onset  . Other Father 77     of a PE after surgery  . Other       there is no known coronary artery disease     Prior to Admission medications   Medication Sig Start Date End Date Taking? Authorizing Provider  acetaminophen (TYLENOL) 500 MG chewable tablet Chew 1,000 mg by mouth once. As needed    Historical Provider, MD  clindamycin (CLEOCIN-T) 1 % gel Apply 1 application topically 2 (two) times daily.      Historical Provider, MD  dabigatran (PRADAXA) 150 MG CAPS Take 150 mg by mouth every 12 (twelve) hours.      Historical Provider, MD  hydrochlorothiazide (HYDRODIURIL) 25 MG tablet Take 25 mg by mouth daily. 11/19/13   Historical Provider, MD  lisinopril (PRINIVIL,ZESTRIL) 40 MG tablet Take 40 mg by mouth daily. 11/19/13   Historical Provider, MD  metroNIDAZOLE (METROCREAM) 0.75 % cream Apply 1 application topically 2 (two) times daily as needed.     Historical Provider, MD    Physical Exam: Filed Vitals:   11/12/14 2328 11/13/14 0100 11/13/14 0130 11/13/14 0238  BP: 149/67 131/54 131/70 138/64  Pulse: 80 79 83 76  Temp:    97.8 F (  36.6 C)  TempSrc:    Oral  Resp: 20 20 20 18   Height:    5\' 10"  (1.778 m)  Weight:    86.728 kg (191 lb 3.2 oz)  SpO2: 99% 96% 97% 98%    General: Alert, Awake and Oriented to Time, Place and Person. Appear in mild distress Eyes: PERRL ENT: Oral Mucosa clear moist. Neck: no JVD Cardiovascular: S1 and S2 Present, no Murmur, Peripheral Pulses Present Respiratory: Bilateral Air entry equal and Decreased,  Clear to Auscultation, no Crackles, no wheezes Abdomen: Bowel Sound present, Soft and non tender Skin: no Rash Extremities: no Pedal edema, no calf tenderness Neurologic: Grossly no focal neuro deficit.  Labs on Admission:  CBC:  Recent Labs Lab  11/12/14 2253 11/12/14 2257 11/13/14 0350  WBC 6.1  --  5.6  NEUTROABS  --   --  3.6  HGB 11.1* 11.6* 11.0*  HCT 33.6* 34.0* 32.7*  MCV 93.9  --  90.8  PLT 135*  --  134*    CMP     Component Value Date/Time   NA 131* 11/13/2014 0350   K 3.6 11/13/2014 0350   CL 96* 11/13/2014 0350   CO2 27 11/13/2014 0350   GLUCOSE 107* 11/13/2014 0350   BUN 12 11/13/2014 0350   CREATININE 0.71 11/13/2014 0350   CALCIUM 8.6* 11/13/2014 0350   PROT 6.1* 11/13/2014 0350   ALBUMIN 3.6 11/13/2014 0350   AST 22 11/13/2014 0350   ALT 14* 11/13/2014 0350   ALKPHOS 40 11/13/2014 0350   BILITOT 1.0 11/13/2014 0350   GFRNONAA >60 11/13/2014 0350   GFRAA >60 11/13/2014 0350    No results for input(s): LIPASE, AMYLASE in the last 168 hours.  No results for input(s): CKTOTAL, CKMB, CKMBINDEX, TROPONINI in the last 168 hours. BNP (last 3 results) No results for input(s): BNP in the last 8760 hours.  ProBNP (last 3 results) No results for input(s): PROBNP in the last 8760 hours.   Radiological Exams on Admission: No results found.  Assessment/Plan Principal Problem:   Melena Active Problems:   Hypertension   Paroxysmal atrial fibrillation   Mitral regurgitation   1. Melena The patient is presenting with compressive black color bowel movements. Patient did not have any significant tenderness, no wheezing without any stable. I'll check his H&H and if there is a further drop will discuss with GI. Patient's primary gastroenterologist is Dr. Henrene Pastor. I would place him on Protonix every 12 hours. We'll keep him nothing by mouth after midnight.  2. Hyponatremia. Etiology unclear. Probably secondary to use of hydrochlorothiazide. I would hold hydrochlorothiazide and recheck sodium levels.  3. Hypertension. Continue lisinopril.  4. Paroxysmal A. fib. The patient is on Pradaxa. Patient has valvular A. fib. Currently I would hold off anticoagulation in the presence of active  bleeding. Monitor H&H.  Advance goals of care discussion: DNR/DNI as per my discussion with patient   DVT Prophylaxis: mechanical compression device Nutrition: Nothing by mouth  Family Communication: family was present at bedside, opportunity was given to ask question and all questions were answered satisfactorily at the time of interview. Disposition: Admitted as inpatient, telemetry unit.  Author: Berle Mull, MD [facility] Pager: [phone] 11/13/2014  If 7PM-7AM, please contact night-coverage [email or web address] Password [email or web address]

## 2014-11-13 NOTE — Progress Notes (Signed)
NCM provided pt and gave permission to speak to wife about Medicare Observation Status Notification. Wife had questions and wanted to speak to her son, Aaron Lucas about notification. Notification was not signed at this time by pt or wife. Son, fax # [phone] requested copy be faxed to his office and he would review notification. Jonnie Finner RN CCM Case Mgmt phone [phone]

## 2014-11-13 NOTE — Progress Notes (Signed)
Spent 30 minutes reviewing labs and blood results with pt and spouse.

## 2014-11-13 NOTE — Progress Notes (Signed)
Patient seen and examined. Admitted after midnight secondary to melena. Hemodynamically stable and with stable Hgb. Please referred to H&P written by Dr. Posey Pronto for further info/details on admission.  Plan: -GI consulted -follow Hgb trend -continue PPI -CLD -will follow clinical course and GI rec's  [name] [phone]

## 2014-11-13 NOTE — Progress Notes (Signed)
Utilization review completed. Namish Krise, RN, BSN. 

## 2014-11-14 DIAGNOSIS — K922 Gastrointestinal hemorrhage, unspecified: Secondary | ICD-10-CM | POA: Diagnosis not present

## 2014-11-14 DIAGNOSIS — I10 Essential (primary) hypertension: Secondary | ICD-10-CM | POA: Diagnosis not present

## 2014-11-14 DIAGNOSIS — K921 Melena: Secondary | ICD-10-CM | POA: Diagnosis not present

## 2014-11-14 LAB — CBC
HCT: 31.2 % — ABNORMAL LOW (ref 39.0–52.0)
HEMOGLOBIN: 10.9 g/dL — AB (ref 13.0–17.0)
MCH: 32.1 pg (ref 26.0–34.0)
MCHC: 34.9 g/dL (ref 30.0–36.0)
MCV: 91.8 fL (ref 78.0–100.0)
Platelets: 124 10*3/uL — ABNORMAL LOW (ref 150–400)
RBC: 3.4 MIL/uL — AB (ref 4.22–5.81)
RDW: 13 % (ref 11.5–15.5)
WBC: 4.9 10*3/uL (ref 4.0–10.5)

## 2014-11-14 LAB — BASIC METABOLIC PANEL
Anion gap: 8 (ref 5–15)
BUN: 7 mg/dL (ref 6–20)
CO2: 26 mmol/L (ref 22–32)
Calcium: 8.7 mg/dL — ABNORMAL LOW (ref 8.9–10.3)
Chloride: 95 mmol/L — ABNORMAL LOW (ref 101–111)
Creatinine, Ser: 0.65 mg/dL (ref 0.61–1.24)
GFR calc Af Amer: >60 mL/min (ref >60)
GFR calc non Af Amer: >60 mL/min (ref >60)
Glucose, Bld: 119 mg/dL — ABNORMAL HIGH (ref 65–99)
POTASSIUM: 3.9 mmol/L (ref 3.5–5.1)
SODIUM: 129 mmol/L — AB (ref 135–145)

## 2014-11-14 MED ORDER — PEG-KCL-NACL-NASULF-NA ASC-C 100 G PO SOLR: 1.0000 | Freq: Once | ORAL | Status: DC

## 2014-11-14 MED ORDER — PEG-KCL-NACL-NASULF-NA ASC-C 100 G PO SOLR
1.0000 | Freq: Once | ORAL | Status: DC
  Filled 2014-11-14: qty 1

## 2014-11-14 NOTE — Progress Notes (Signed)
          Daily Rounding Note  11/14/2014, 8:21 AM  LOS: 1 day   SUBJECTIVE:       One dark, formed stool yesterday.  Feels well, no complaints  OBJECTIVE:         Vital signs in last 24 hours:    Temp:  [97.7 F (36.5 C)-98.3 F (36.8 C)] 98.3 F (36.8 C) (08/03 0459) Pulse Rate:  [66-71] 66 (08/03 0459) Resp:  [16-18] 18 (08/03 0459) BP: (118-126)/(55-69) 126/55 mmHg (08/03 0459) SpO2:  [98 %-99 %] 98 % (08/03 0459) Weight:  [188 lb 4.8 oz (85.412 kg)] 188 lb 4.8 oz (85.412 kg) (08/03 0459) Last BM Date: 11/13/14 Filed Weights   11/12/14 2228 11/13/14 0238 11/14/14 0459  Weight: 195 lb (88.451 kg) 191 lb 3.2 oz (86.728 kg) 188 lb 4.8 oz (85.412 kg)   General: pleasant, comfortable.   Somewhat chronically unwell looking   Heart: RRR Chest: clear bil.   Abdomen: soft, NT, ND.  Active BS  Extremities: no CCE Neuro/Psych:  Pleasant, oriented x 3.  Alert.   Intake/Output from previous day: 08/02 0701 - 08/03 0700 In: 1744 [P.O.:1744] Out: 776 [Urine:775; Stool:1]  Intake/Output this shift:    Lab Results:  Recent Labs  11/13/14 0350 11/13/14 1902 11/14/14 0625  WBC 5.6 6.1 4.9  HGB 11.0* 10.6* 10.9*  HCT 32.7* 31.1* 31.2*  PLT 134* 138* 124*   BMET  Recent Labs  11/12/14 2253 11/12/14 2257 11/13/14 0350  NA 128* 132* 131*  K 3.9 4.0 3.6  CL 96* 94* 96*  CO2 27  --  27  GLUCOSE 105* 108* 107*  BUN 13 16 12   CREATININE 0.77 0.80 0.71  CALCIUM 8.7*  --  8.6*   LFT  Recent Labs  11/12/14 2253 11/13/14 0350  PROT 6.3* 6.1*  ALBUMIN 3.9 3.6  AST 24 22  ALT 14* 14*  ALKPHOS 43 40  BILITOT 0.9 1.0   PT/INR  Recent Labs  11/12/14 2253 11/13/14 0350  LABPROT 18.5* 17.4*  INR 1.54* 1.41   Hepatitis Panel No results for input(s): HEPBSAG, HCVAB, HEPAIGM, HEPBIGM in the last 72 hours.  Studies/Results: No results found.  ASSESMENT:   * GI bleed. Painless, no accompanying GI sxs.  Although the BUN is normal, suspect this is a upper GI tract source. Question ulcer disease, question AVMs.  hgb stable.   * Hx tubular adenomatous colon polyps 2006 and 2011. Dr. Henrene Pastor had felt patient did not require repeat surveillance colonoscopies as of office visit 02/2013  * Thrombocytopenia. Non-critical.   * Chronic Pradaxa for PAF. Last dose PM of 8/1. Note for CrCl 50-30 ml/min - stop 48 hours prior to the procedure, if high risk for bleeding stop for 4 days. CrCl < 30 - hold drug for 2 - 5 days and > 5 days for high risk bleeding. In patients at high risk of bleeding a thrombin time can be checked 6 - 12 hours prior to surgery - if normal there is no dabigatran present    PLAN   *  Colonoscopy and EGD tomorrow at 0900.  Clears today, begin split dose prep tonight.     Azucena Freed  11/14/2014, 8:21 AM Pager: [phone]  ________________________________________________________________________  Rudolpho Sevin MD note:  I personally examined the patient, reviewed the data and agree with the assessment and plan described above.   Owens Loffler, MD [facility] Pager [phone]

## 2014-11-14 NOTE — Progress Notes (Signed)
[facility] PROGRESS NOTE  Aaron Lucas GBM:[id number] DOB: [birth year] DOA: 11/12/2014 PCP: Geoffery Lyons, MD  Assessment/Plan: 1. Melena   Protonix every 12 hours. Plan for endoscopy, colonoscopy 8-4 Hb has remain stable.  Will follow GI recommendation regarding when to resume pradaxa.   2. Hyponatremia. Etiology unclear. Probably secondary to use of hydrochlorothiazide. Continue to hold HCTZ. Repeat B-met   3. Hypertension. Continue lisinopril.  4. Paroxysmal A. fib. Currently I would hold off anticoagulation in the presence of active bleeding. Monitor H&H.  Code Status: DNR Family Communication: care discussed with wife who was at bedside.  Disposition Plan: Remain in patient, endo colonoscopy 8-4   Consultants:  GI  Procedures:  none  Antibiotics:  none  HPI/Subjective: Feeling ok, no diarrhea, no abdominal pain   Objective: Filed Vitals:   11/14/14 1400  BP: 132/66  Pulse: 67  Temp: 98.1 F (36.7 C)  Resp: 18    Intake/Output Summary (Last 24 hours) at 11/14/14 1516 Last data filed at 11/14/14 1400  Gross per 24 hour  Intake   1540 ml  Output   1175 ml  Net    365 ml   Filed Weights   11/12/14 2228 11/13/14 0238 11/14/14 0459  Weight: 88.451 kg (195 lb) 86.728 kg (191 lb 3.2 oz) 85.412 kg (188 lb 4.8 oz)    Exam:   General:  Alert in no acute distress.   Cardiovascular: S 1, S 2 RRR  Respiratory: CTA  Abdomen: BS present, soft, nt  Musculoskeletal: no edema  Data Reviewed: Basic Metabolic Panel:  Recent Labs Lab 11/12/14 2253 11/12/14 2257 11/13/14 0350  NA 128* 132* 131*  K 3.9 4.0 3.6  CL 96* 94* 96*  CO2 27  --  27  GLUCOSE 105* 108* 107*  BUN _0 CREATININE 0.77 0.80 0.71  CALCIUM 8.7*  --  8.6*   Liver Function Tests:  Recent Labs Lab 11/12/14 2253 11/13/14 0350  AST 24 22  ALT 14* 14*  ALKPHOS 43 40  BILITOT 0.9 1.0  PROT 6.3* 6.1*  ALBUMIN 3.9 3.6   No results for input(s):  LIPASE, AMYLASE in the last 168 hours. No results for input(s): AMMONIA in the last 168 hours. CBC:  Recent Labs Lab 11/12/14 2253 11/12/14 2257 11/13/14 0350 11/13/14 1902 11/14/14 0625  WBC 6.1  --  5.6 6.1 4.9  NEUTROABS  --   --  3.6  --   --   HGB 11.1* 11.6* 11.0* 10.6* 10.9*  HCT 33.6* 34.0* 32.7* 31.1* 31.2*  MCV 93.9  --  90.8 90.4 91.8  PLT 135*  --  134* 138* 124*   Cardiac Enzymes: No results for input(s): CKTOTAL, CKMB, CKMBINDEX, TROPONINI in the last 168 hours. BNP (last 3 results) No results for input(s): BNP in the last 8760 hours.  ProBNP (last 3 results) No results for input(s): PROBNP in the last 8760 hours.  CBG: No results for input(s): GLUCAP in the last 168 hours.  No results found for this or any previous visit (from the past 240 hour(s)).   Studies: No results found.  Scheduled Meds: . lisinopril  40 mg Oral Daily  . pantoprazole  40 mg Oral Q0600  . peg 3350 powder  1 kit Oral Once  . sodium chloride  3 mL Intravenous Q12H   Continuous Infusions:   Principal Problem:   Melena Active Problems:   Hypertension   Paroxysmal atrial fibrillation   Mitral regurgitation    Time  spent: 35 minutes.     Niel Hummer A  [facility] Pager [phone]. If 7PM-7AM, please contact night-coverage at [email or web address], password [facility] 11/14/2014, 3:16 PM  LOS: 1 day

## 2014-11-15 ENCOUNTER — Encounter (HOSPITAL_COMMUNITY): Payer: Self-pay | Admitting: *Deleted

## 2014-11-15 ENCOUNTER — Encounter (HOSPITAL_COMMUNITY)
Admission: EM | Disposition: A | Discharge status: Home / Self Care | Payer: Self-pay | Source: Home / Self Care | Attending: Internal Medicine

## 2014-11-15 ENCOUNTER — Observation Stay (HOSPITAL_COMMUNITY): Payer: Medicare Other | Admitting: Certified Registered"

## 2014-11-15 DIAGNOSIS — I480 Paroxysmal atrial fibrillation: Secondary | ICD-10-CM | POA: Diagnosis not present

## 2014-11-15 DIAGNOSIS — K921 Melena: Secondary | ICD-10-CM | POA: Diagnosis not present

## 2014-11-15 DIAGNOSIS — I080 Rheumatic disorders of both mitral and aortic valves: Secondary | ICD-10-CM | POA: Diagnosis not present

## 2014-11-15 DIAGNOSIS — I10 Essential (primary) hypertension: Secondary | ICD-10-CM | POA: Diagnosis not present

## 2014-11-15 HISTORY — PX: ESOPHAGOGASTRODUODENOSCOPY: SHX5428

## 2014-11-15 HISTORY — PX: COLONOSCOPY: SHX5424

## 2014-11-15 LAB — BASIC METABOLIC PANEL
ANION GAP: 8 (ref 5–15)
BUN: 5 mg/dL — ABNORMAL LOW (ref 6–20)
CHLORIDE: 98 mmol/L — AB (ref 101–111)
CO2: 25 mmol/L (ref 22–32)
Calcium: 8.8 mg/dL — ABNORMAL LOW (ref 8.9–10.3)
Creatinine, Ser: 0.54 mg/dL — ABNORMAL LOW (ref 0.61–1.24)
GFR calc Af Amer: >60 mL/min (ref >60)
GFR calc non Af Amer: >60 mL/min (ref >60)
Glucose, Bld: 101 mg/dL — ABNORMAL HIGH (ref 65–99)
POTASSIUM: 3.6 mmol/L (ref 3.5–5.1)
SODIUM: 131 mmol/L — AB (ref 135–145)

## 2014-11-15 LAB — HEMOGLOBIN AND HEMATOCRIT, BLOOD
HEMATOCRIT: 31.5 % — AB (ref 39.0–52.0)
HEMOGLOBIN: 10.9 g/dL — AB (ref 13.0–17.0)

## 2014-11-15 SURGERY — EGD (ESOPHAGOGASTRODUODENOSCOPY)
Anesthesia: Monitor Anesthesia Care

## 2014-11-15 MED ORDER — SODIUM CHLORIDE 0.9 % IV SOLN
INTRAVENOUS | Status: DC
  Administered 2014-11-15: 09:00:00 via INTRAVENOUS

## 2014-11-15 MED ORDER — PANTOPRAZOLE SODIUM 40 MG PO TBEC: 40.0000 mg | DELAYED_RELEASE_TABLET | Freq: Every day | ORAL | Status: DC

## 2014-11-15 MED ORDER — LIDOCAINE HCL (CARDIAC) 20 MG/ML IV SOLN
INTRAVENOUS | Status: DC | PRN
  Administered 2014-11-15: 60 mg via INTRAVENOUS

## 2014-11-15 MED ORDER — FENTANYL CITRATE (PF) 100 MCG/2ML IJ SOLN: 25.0000 ug | INTRAMUSCULAR | Status: DC | PRN

## 2014-11-15 MED ORDER — PROPOFOL INFUSION 10 MG/ML OPTIME
INTRAVENOUS | Status: DC | PRN
  Administered 2014-11-15: 75 ug/kg/min via INTRAVENOUS

## 2014-11-15 NOTE — Anesthesia Preprocedure Evaluation (Signed)
Anesthesia Evaluation  Patient identified by MRN, date of birth, ID band Patient awake    Reviewed: Allergy & Precautions, H&P , NPO status , Patient's Chart, lab work & pertinent test results  Airway Mallampati: III  TM Distance: >3 FB Neck ROM: Full    Dental no notable dental hx. (+) Teeth Intact, Dental Advisory Given   Pulmonary neg pulmonary ROS,  breath sounds clear to auscultation  Pulmonary exam normal       Cardiovascular hypertension, Pt. on medications + CAD + dysrhythmias Atrial Fibrillation + Valvular Problems/Murmurs AS and MR Rhythm:Regular Rate:Normal     Neuro/Psych negative neurological ROS  negative psych ROS   GI/Hepatic negative GI ROS, Neg liver ROS,   Endo/Other  negative endocrine ROS  Renal/GU negative Renal ROS  negative genitourinary   Musculoskeletal   Abdominal   Peds  Hematology negative hematology ROS (+)   Anesthesia Other Findings   Reproductive/Obstetrics negative OB ROS                             Anesthesia Physical Anesthesia Plan  ASA: III  Anesthesia Plan: MAC   Post-op Pain Management:    Induction: Intravenous  Airway Management Planned: Nasal Cannula  Additional Equipment:   Intra-op Plan:   Post-operative Plan:   Informed Consent: I have reviewed the patients History and Physical, chart, labs and discussed the procedure including the risks, benefits and alternatives for the proposed anesthesia with the patient or authorized representative who has indicated his/her understanding and acceptance.   Dental advisory given  Plan Discussed with: CRNA  Anesthesia Plan Comments:         Anesthesia Quick Evaluation

## 2014-11-15 NOTE — Anesthesia Postprocedure Evaluation (Signed)
  Anesthesia Post-op Note  Patient: Aaron Lucas  Procedure(s) Performed: Procedure(s): ESOPHAGOGASTRODUODENOSCOPY (EGD) (N/A) COLONOSCOPY (N/A)  Patient Location: PACU  Anesthesia Type: MAC  Level of Consciousness: awake and alert   Airway and Oxygen Therapy: Patient Spontanous Breathing  Post-op Pain: Controlled  Post-op Assessment: Post-op Vital signs reviewed, Patient's Cardiovascular Status Stable and Respiratory Function Stable  Post-op Vital Signs: Reviewed  Filed Vitals:   11/15/14 0945  BP:   Pulse: 73  Temp:   Resp: 13    Complications: No apparent anesthesia complications

## 2014-11-15 NOTE — Discharge Summary (Signed)
Physician Discharge Summary  Aaron Lucas WLS:[id number] DOB: 06-13-1928 DOA: 11/12/2014  PCP: Geoffery Lyons, MD  Admit date: 11/12/2014 Discharge date: 11/15/2014  Time spent: 35 minutes  Recommendations for Outpatient Follow-up:  Needs B-met to follow sodium level. If hyponatremia persist continue to hold HCTZ. \ Needs CBC to follow hb. If still low consider iron trial.  If GI bleed reoccurs he will need capsule endoscopy; Follow up with cardio to discussed further anticoagulation.   Discharge Diagnoses:    Melena   Hypertension   Paroxysmal atrial fibrillation   Mitral regurgitation   Discharge Condition: Stable  Diet recommendation: Heart healthy  Filed Weights   11/13/14 0238 11/14/14 0459 11/15/14 0601  Weight: 86.728 kg (191 lb 3.2 oz) 85.412 kg (188 lb 4.8 oz) 86.183 kg (190 lb)    History of present illness:  Aaron Lucas is a 81 y.o. male with Past medical history of aortic stenosis and mitral regurgitation atrial fibrillation coronary artery disease on chronic anticoagulation, CHADVASC 4. The patient is presenting with complaints of  T Black color bowel movement that happened 3 times today. The patient did not have any pain. Denies any nausea or vomiting or abdominal pain or acid reflux. Denies any fever or chills or diarrhea otherwise. Denies any active bleeding anywhere else. Denies any change in his medication. Denies having a similar h bleeding in the past.  Hospital Course:  1. Melena  Protonix every 12 hours. S/P endoscopy:There was a benign appearing mucosal ring at the GE junction. The examination was otherwise normal. There was no blood in the UGI tract , colonoscopy : There were numerous diverticulum in the left colon. There were internal hemorrhoids. The examination was otherwise normal. No blood in the colon Hb has remain stable.  Ok to  resume pradaxa per GI If he bleed again might need capsule endoscopy.  GI bleed thought to be  secondary to AVM.   2. Hyponatremia. Etiology unclear. Probably secondary to use of hydrochlorothiazide. Continue to hold HCTZ. Repeat B-met   3. Hypertension. Continue lisinopril.  4. Paroxysmal A. fib. Currently I would hold off anticoagulation in the presence of active bleeding. Monitor H&H.  Procedures:  Endoscopy:   Colonoscopy;   Consultations:  Dr Ardis Hughs.   Discharge Exam: Filed Vitals:   11/15/14 1002  BP: 133/64  Pulse:   Temp:   Resp:     General: NAD Cardiovascular: S 1, S 2 RRR Respiratory: CTA  Discharge Instructions   Discharge Instructions    Diet - low sodium heart healthy    Complete by:  As directed      Increase activity slowly    Complete by:  As directed           Current Discharge Medication List    START taking these medications   Details  pantoprazole (PROTONIX) 40 MG tablet Take 1 tablet (40 mg total) by mouth daily at 6 (six) AM. Qty: 30 tablet, Refills: 0      CONTINUE these medications which have NOT CHANGED   Details  acetaminophen (TYLENOL) 500 MG chewable tablet Chew 1,000 mg by mouth once. As needed    Calcium Polycarbophil (FIBER) 625 MG TABS Take 1 tablet by mouth daily.    clindamycin (CLEOCIN-T) 1 % gel Apply 1 application topically 2 (two) times daily.      dabigatran (PRADAXA) 150 MG CAPS Take 150 mg by mouth every 12 (twelve) hours.      lisinopril (PRINIVIL,ZESTRIL) 40 MG tablet Take 40  mg by mouth daily.    loratadine (CLARITIN) 10 MG tablet Take 10 mg by mouth daily.    metroNIDAZOLE (METROCREAM) 0.75 % cream Apply 1 application topically 2 (two) times daily as needed.     Multiple Vitamins-Minerals (MULTIVITAMIN WITH MINERALS) tablet Take 1 tablet by mouth daily.    Omega-3 Fatty Acids (FISH OIL PO) Take 2 capsules by mouth daily.    triamcinolone (NASACORT ALLERGY 24HR) 55 MCG/ACT AERO nasal inhaler Place 1 spray into both nostrils daily.    vitamin C (ASCORBIC ACID) 500 MG tablet Take 500 mg by  mouth daily.      STOP taking these medications     hydrochlorothiazide (HYDRODIURIL) 25 MG tablet        Allergies  Allergen Reactions  . Diamox [Acetazolamide]     unknown  . Neptazane [Methazolamide]     unknown  . Ciprofloxacin Rash  . Doxycycline Rash  . Erythromycin Rash  . Penicillins Swelling and Rash   Follow-up Information    Follow up with [name],[name], MD In 1 week.   Specialty:  Internal Medicine   Contact information:   [address] [place] [address] [phone]       Follow up with Kirk Ruths, MD.   Specialty:  Cardiology   Why:  keep appoinment with Dr Lieutenant Diego information:   [address] [place] [address] [phone]        The results of significant diagnostics from this hospitalization (including imaging, microbiology, ancillary and laboratory) are listed below for reference.    Significant Diagnostic Studies: No results found.  Microbiology: No results found for this or any previous visit (from the past 240 hour(s)).   Labs: Basic Metabolic Panel:  Recent Labs Lab 11/12/14 2253 11/12/14 2257 11/13/14 0350 11/14/14 1425 11/15/14 0304  NA 128* 132* 131* 129* 131*  K 3.9 4.0 3.6 3.9 3.6  CL 96* 94* 96* 95* 98*  CO2 27  --  $R'27 26 25  'PB$ GLUCOSE 105* 108* 107* 119* 101*  BUN $Re'13 16 12 7 'ScC$ 5*  CREATININE 0.77 0.80 0.71 0.65 0.54*  CALCIUM 8.7*  --  8.6* 8.7* 8.8*   Liver Function Tests:  Recent Labs Lab 11/12/14 2253 11/13/14 0350  AST 24 22  ALT 14* 14*  ALKPHOS 43 40  BILITOT 0.9 1.0  PROT 6.3* 6.1*  ALBUMIN 3.9 3.6   No results for input(s): LIPASE, AMYLASE in the last 168 hours. No results for input(s): AMMONIA in the last 168 hours. CBC:  Recent Labs Lab 11/12/14 2253 11/12/14 2257 11/13/14 0350 11/13/14 1902 11/14/14 0625 11/15/14 1404  WBC 6.1  --  5.6 6.1 4.9  --   NEUTROABS  --   --  3.6  --   --   --   HGB 11.1* 11.6* 11.0* 10.6* 10.9* 10.9*  HCT 33.6* 34.0*  32.7* 31.1* 31.2* 31.5*  MCV 93.9  --  90.8 90.4 91.8  --   PLT 135*  --  134* 138* 124*  --    Cardiac Enzymes: No results for input(s): CKTOTAL, CKMB, CKMBINDEX, TROPONINI in the last 168 hours. BNP: BNP (last 3 results) No results for input(s): BNP in the last 8760 hours.  ProBNP (last 3 results) No results for input(s): PROBNP in the last 8760 hours.  CBG: No results for input(s): GLUCAP in the last 168 hours.     Signed:  Niel Hummer A  [facility] 11/15/2014, 2:43 PM

## 2014-11-15 NOTE — Op Note (Signed)
[facility] [place], [address]   COLONOSCOPY PROCEDURE REPORT  PATIENT: Braycen, Burandt  MR#: [id number] BIRTHDATE: November 24, 1928 , 49  yrs. old GENDER: male ENDOSCOPIST: Milus Banister, MD PROCEDURE DATE:  11/15/2014 PROCEDURE:   Colonoscopy, diagnostic First Screening Colonoscopy - Avg.  risk and is 50 yrs.  old or older - No. ASA CLASS:   Class IV INDICATIONS:anemia, fobt + stool, rectal bleed, on blood thinners.  MEDICATIONS: Monitored anesthesia care  DESCRIPTION OF PROCEDURE:   After the risks benefits and alternatives of the procedure were thoroughly explained, informed consent was obtained.  The digital rectal exam revealed no abnormalities of the rectum.   The Pentax Adult Colon [id number] endoscope was introduced through the anus and advanced to the cecum, which was identified by both the appendix and ileocecal valve. No adverse events experienced.   The quality of the prep was excellent.  The instrument was then slowly withdrawn as the colon was fully examined. Estimated blood loss is zero unless otherwise noted in this procedure report.   COLON FINDINGS: There were numerous diverticulum in the left colon. There were internal hemorrhoids.  The examination was otherwise normal.  No blood in the colon.  Retroflexed views revealed no abnormalities. The time to cecum = 73min      Withdrawal time = 53min        The scope was withdrawn and the procedure completed. COMPLICATIONS: There were no immediate complications.  ENDOSCOPIC IMPRESSION: There were numerous diverticulum in the left colon.  There were internal hemorrhoids.  The examination was otherwise normal.  No blood in the colon  RECOMMENDATIONS: Will proceed with EGD now.  eSigned:  Milus Banister, MD 11/15/2014 9:26 AM   cc: Scarlette Shorts, MD

## 2014-11-15 NOTE — Transfer of Care (Signed)
Immediate Anesthesia Transfer of Care Note  Patient: Aaron Lucas  Procedure(s) Performed: Procedure(s): ESOPHAGOGASTRODUODENOSCOPY (EGD) (N/A) COLONOSCOPY (N/A)  Patient Location: Endoscopy Unit  Anesthesia Type:MAC  Level of Consciousness: awake, alert , oriented and patient cooperative  Airway & Oxygen Therapy: Patient Spontanous Breathing and Patient connected to nasal cannula oxygen  Post-op Assessment: Report given to RN, Post -op Vital signs reviewed and stable and Patient moving all extremities  Post vital signs: Reviewed and stable  Last Vitals:  Filed Vitals:   11/15/14 1002  BP: 133/64  Pulse:   Temp:   Resp:     Complications: No apparent anesthesia complications

## 2014-11-15 NOTE — Op Note (Signed)
[facility] [place], [address]   ENDOSCOPY PROCEDURE REPORT  PATIENT: Aaron Lucas, Aaron Lucas  MR#: [id number] BIRTHDATE: 1928/07/16 , 47  yrs. old GENDER: male ENDOSCOPIST: Milus Banister, MD PROCEDURE DATE:  11/15/2014 PROCEDURE:  EGD, diagnostic ASA CLASS:     Class IV INDICATIONS:  Gi bleed, on blood thinners. MEDICATIONS: Monitored anesthesia care TOPICAL ANESTHETIC: none  DESCRIPTION OF PROCEDURE: After the risks benefits and alternatives of the procedure were thoroughly explained, informed consent was obtained.  The Pentax Gastroscope [id number] endoscope was introduced through the mouth and advanced to the second portion of the duodenum , Without limitations.  The instrument was slowly withdrawn as the mucosa was fully examined.  There was a benign appearing mucosal ring at the GE junction.  The examination was otherwise normal.  There was no blood in the UGI tract.  Retroflexed views revealed no abnormalities.     The scope was then withdrawn from the patient and the procedure completed.  COMPLICATIONS: There were no immediate complications.  ENDOSCOPIC IMPRESSION: There was a benign appearing mucosal ring at the GE junction.  The examination was otherwise normal.  There was no blood in the UGI tract  RECOMMENDATIONS: He has severe aortic stenosis.  I presume his overt bleeding was from small bowel AVM(s) that are more common in AS.  OK to resume his blood thinners.  Would not pursue any further GI testing unless he has repeat overt bleeding on blood thinners.  If that occurs, then likely next test would be small bowel capsule endoscopy.  He is OK to d/c home today.   eSigned:  Milus Banister, MD 11/15/2014 9:31 AM   cc: Scarlette Shorts, MD

## 2014-11-16 ENCOUNTER — Encounter (HOSPITAL_COMMUNITY): Payer: Self-pay | Admitting: Gastroenterology

## 2014-11-28 ENCOUNTER — Telehealth: Payer: Self-pay | Admitting: *Deleted

## 2014-11-28 NOTE — Telephone Encounter (Signed)
Was going to call the pt to get history but the pt has no phone # on file..JKW

## 2014-12-06 ENCOUNTER — Encounter: Payer: Self-pay | Admitting: Cardiology

## 2014-12-06 ENCOUNTER — Ambulatory Visit (INDEPENDENT_AMBULATORY_CARE_PROVIDER_SITE_OTHER): Payer: Medicare Other | Admitting: Cardiology

## 2014-12-06 VITALS — BP 162/98 | HR 83 | Ht 69.0 in | Wt 203.0 lb

## 2014-12-06 DIAGNOSIS — I340 Nonrheumatic mitral (valve) insufficiency: Secondary | ICD-10-CM

## 2014-12-06 DIAGNOSIS — I350 Nonrheumatic aortic (valve) stenosis: Secondary | ICD-10-CM | POA: Diagnosis not present

## 2014-12-06 DIAGNOSIS — I480 Paroxysmal atrial fibrillation: Secondary | ICD-10-CM

## 2014-12-06 NOTE — Progress Notes (Signed)
HPI The patient presents for follow of MR, AS and atrial fibrillation.    Since I last saw him he had some GI bleeding and has had an EGD and colonoscopy. They could not find a source. He was briefly off Pradaxa but is currently back on this.  He denies any cardiovascular symptoms such as chest discomfort, neck or arm discomfort. He actually exercises routinely and he has absolutely no shortness of breath and denies any PND or orthopnea. He's had no palpitations, presyncope or syncope. He's had no weight gain and some stable mild edema. He was taken off of hydrochlorothiazide recently because of his lower sodium. He does report that his blood pressure has been creeping higher. I reviewed recent hospital records.  Allergies  Allergen Reactions  . Diamox [Acetazolamide]     unknown  . Neptazane [Methazolamide]     unknown  . Ciprofloxacin Rash  . Doxycycline Rash  . Erythromycin Rash  . Penicillins Swelling and Rash    Current Outpatient Prescriptions  Medication Sig Dispense Refill  . acetaminophen (TYLENOL) 500 MG chewable tablet Chew 1,000 mg by mouth once. As needed    . Calcium Polycarbophil (FIBER) 625 MG TABS Take 1 tablet by mouth daily.    . clindamycin (CLEOCIN-T) 1 % gel Apply 1 application topically 2 (two) times daily.      . dabigatran (PRADAXA) 150 MG CAPS Take 150 mg by mouth every 12 (twelve) hours.      Marland Kitchen lisinopril (PRINIVIL,ZESTRIL) 40 MG tablet Take 40 mg by mouth daily.    Marland Kitchen loratadine (CLARITIN) 10 MG tablet Take 10 mg by mouth daily.    . metroNIDAZOLE (METROCREAM) 0.75 % cream Apply 1 application topically 2 (two) times daily as needed.     . Multiple Vitamins-Minerals (MULTIVITAMIN WITH MINERALS) tablet Take 1 tablet by mouth daily.    . Omega-3 Fatty Acids (FISH OIL PO) Take 2 capsules by mouth daily.    Marland Kitchen omeprazole (PRILOSEC OTC) 20 MG tablet Take 20 mg by mouth daily.    Marland Kitchen triamcinolone (NASACORT ALLERGY 24HR) 55 MCG/ACT AERO nasal inhaler Place 1 spray into  both nostrils daily.    . valACYclovir (VALTREX) 1000 MG tablet Take 1 tablet by mouth 3 (three) times daily.  0  . vitamin C (ASCORBIC ACID) 500 MG tablet Take 500 mg by mouth daily.     No current facility-administered medications for this visit.    Past Medical History  Diagnosis Date  . Hypertension   . Cataracts, bilateral   . Valvular heart disease     MV regurge and Ao stenosis  . Paroxysmal atrial fibrillation     On Pradaxa.   . Vertigo   . Transitional cell carcinoma of bladder     recurrent  . Prostate cancer 2004  . Hx of colonic polyps 2006,  2011  . Coronary artery disease   . Diverticulosis 2006  . Detached retina Twice    As a result he is lost vision in the left eye.    Past Surgical History  Procedure Laterality Date  . Pleural effusion drainage  2010  . Radioactive seed implant  2004    For treatment of prostate cancer  . Squamous cell carcinoma excision  2002, 2003, 2006.  Marland Kitchen Thoracentesis    . Colonoscopy  2006 and 11/2009    tubular adenomas on both studies. int rrhoids, diverticulosis.   . Cystoscopy with biopsy  09/2000, 10/2001, 01/2005    Low grade papillary transitional cell  carcinoma all 3 biopsies. Focal urothelial atypia in 2006.  . Cataract extraction    . Esophagogastroduodenoscopy N/A 11/15/2014    Procedure: ESOPHAGOGASTRODUODENOSCOPY (EGD);  Surgeon: Milus Banister, MD;  Location: [facility];  Service: Endoscopy;  Laterality: N/A;  . Colonoscopy N/A 11/15/2014    Procedure: COLONOSCOPY;  Surgeon: Milus Banister, MD;  Location: [facility];  Service: Endoscopy;  Laterality: N/A;    ROS:  As stated in the HPI and negative for all other systems.  PHYSICAL EXAM BP 162/98 mmHg  Pulse 83  Ht 5\' 9"  (1.753 m)  Wt 203 lb (92.08 kg)  BMI 29.96 kg/m2 GENERAL:  Well appearing HEENT:  Fundi not visualized, oral mucosa unremarkable, disconjugate gaze with anisocoria.   NECK:  No jugular venous distention, waveform within normal limits, carotid  upstroke brisk and symmetric, no bruits, no thyromegaly LUNGS:  Clear to auscultation bilaterally CHEST:  Unremarkable HEART:  PMI not displaced or sustained,S1 and S2 within normal limits, no S3, no S4, no clicks, no rubs, apical holosystolic murmur and murmur radiating out the aortic outflow tract. ABD:  Flat, positive bowel sounds normal in frequency in pitch, no bruits, no rebound, no guarding, no midline pulsatile mass, no hepatomegaly, no splenomegaly, varicose veins. EXT:  2 plus pulses throughout, mild right greater than left edema, no cyanosis no clubbing SKIN:  Eruptive rash on right scapular region  EKG:  Sinus rhythm, rate 83, axis within normal limits, intervals within normal limits, no acute ST-T wave changes.  12/06/2014  ASSESSMENT AND PLAN  Paroxysmal atrial fibrillation -  He has had no symptomatic paroxysms. He tolerates the Pradaxa and he will continue with this unless he has recurrent GI bleeding.  We will discuss this if he has any recurrent urinary bleeding or problems elsewhere  Mitral regurgitation I will check this again with the echo below  Aortic stenosis -  He has severe AS with a mean gradient in the 40s. However, he denies any cardiovascular symptoms such as shortness of breath. I will repeat an echocardiogram follow him closely clinically. However, for now there is not an indication for surgical or percutaneous intervention.  Hypertension -  The blood pressure is elevated.  However, this is unusual. He will keep a blood pressure diary and I will change medications if this is persistently elevated.

## 2014-12-06 NOTE — Patient Instructions (Signed)
Your physician wants you to follow-up in: 6 Months. You will receive a reminder letter in the mail two months in advance. If you don't receive a letter, please call our office to schedule the follow-up appointment.  Your physician has requested that you have an echocardiogram. Echocardiography is a painless test that uses sound waves to create images of your heart. It provides your doctor with information about the size and shape of your heart and how well your heart's chambers and valves are working. This procedure takes approximately one hour. There are no restrictions for this procedure.  Keep a Blood Pressure dairy

## 2014-12-20 ENCOUNTER — Other Ambulatory Visit: Payer: Self-pay

## 2014-12-20 ENCOUNTER — Ambulatory Visit (HOSPITAL_COMMUNITY): Payer: Medicare Other | Attending: Cardiovascular Disease

## 2014-12-20 DIAGNOSIS — I480 Paroxysmal atrial fibrillation: Secondary | ICD-10-CM

## 2014-12-20 DIAGNOSIS — I340 Nonrheumatic mitral (valve) insufficiency: Secondary | ICD-10-CM

## 2014-12-20 DIAGNOSIS — I350 Nonrheumatic aortic (valve) stenosis: Secondary | ICD-10-CM

## 2014-12-21 ENCOUNTER — Telehealth: Payer: Self-pay | Admitting: Cardiology

## 2014-12-21 NOTE — Telephone Encounter (Signed)
Closed encounter °

## 2015-01-08 ENCOUNTER — Encounter (HOSPITAL_BASED_OUTPATIENT_CLINIC_OR_DEPARTMENT_OTHER): Payer: Self-pay | Admitting: Adult Health

## 2015-01-08 ENCOUNTER — Emergency Department (HOSPITAL_BASED_OUTPATIENT_CLINIC_OR_DEPARTMENT_OTHER): Payer: Medicare Other

## 2015-01-08 ENCOUNTER — Emergency Department (HOSPITAL_BASED_OUTPATIENT_CLINIC_OR_DEPARTMENT_OTHER)
Admission: EM | Admit: 2015-01-08 | Discharge: 2015-01-08 | Disposition: A | Discharge status: Home / Self Care | Payer: Medicare Other | Attending: Emergency Medicine | Admitting: Emergency Medicine

## 2015-01-08 DIAGNOSIS — H269 Unspecified cataract: Secondary | ICD-10-CM | POA: Diagnosis not present

## 2015-01-08 DIAGNOSIS — Z79899 Other long term (current) drug therapy: Secondary | ICD-10-CM | POA: Insufficient documentation

## 2015-01-08 DIAGNOSIS — I4891 Unspecified atrial fibrillation: Secondary | ICD-10-CM | POA: Diagnosis not present

## 2015-01-08 DIAGNOSIS — Z8719 Personal history of other diseases of the digestive system: Secondary | ICD-10-CM | POA: Diagnosis not present

## 2015-01-08 DIAGNOSIS — Z880 Allergy status to penicillin: Secondary | ICD-10-CM | POA: Diagnosis not present

## 2015-01-08 DIAGNOSIS — Z8546 Personal history of malignant neoplasm of prostate: Secondary | ICD-10-CM | POA: Insufficient documentation

## 2015-01-08 DIAGNOSIS — G4700 Insomnia, unspecified: Secondary | ICD-10-CM

## 2015-01-08 DIAGNOSIS — I10 Essential (primary) hypertension: Secondary | ICD-10-CM | POA: Insufficient documentation

## 2015-01-08 DIAGNOSIS — Z8551 Personal history of malignant neoplasm of bladder: Secondary | ICD-10-CM | POA: Insufficient documentation

## 2015-01-08 DIAGNOSIS — R600 Localized edema: Secondary | ICD-10-CM | POA: Diagnosis not present

## 2015-01-08 DIAGNOSIS — D500 Iron deficiency anemia secondary to blood loss (chronic): Secondary | ICD-10-CM | POA: Insufficient documentation

## 2015-01-08 DIAGNOSIS — D649 Anemia, unspecified: Secondary | ICD-10-CM

## 2015-01-08 DIAGNOSIS — R799 Abnormal finding of blood chemistry, unspecified: Secondary | ICD-10-CM

## 2015-01-08 DIAGNOSIS — R7989 Other specified abnormal findings of blood chemistry: Secondary | ICD-10-CM | POA: Diagnosis not present

## 2015-01-08 DIAGNOSIS — I2510 Atherosclerotic heart disease of native coronary artery without angina pectoris: Secondary | ICD-10-CM | POA: Insufficient documentation

## 2015-01-08 DIAGNOSIS — Z86010 Personal history of colonic polyps: Secondary | ICD-10-CM | POA: Diagnosis not present

## 2015-01-08 DIAGNOSIS — R609 Edema, unspecified: Secondary | ICD-10-CM

## 2015-01-08 LAB — CBC WITH DIFFERENTIAL/PLATELET
BASOS PCT: 0 %
Basophils Absolute: 0 10*3/uL (ref 0.0–0.1)
EOS ABS: 0.1 10*3/uL (ref 0.0–0.7)
Eosinophils Relative: 1 %
HCT: 35 % — ABNORMAL LOW (ref 39.0–52.0)
HEMOGLOBIN: 11.2 g/dL — AB (ref 13.0–17.0)
Lymphocytes Relative: 14 %
Lymphs Abs: 1.1 10*3/uL (ref 0.7–4.0)
MCH: 29.6 pg (ref 26.0–34.0)
MCHC: 32 g/dL (ref 30.0–36.0)
MCV: 92.3 fL (ref 78.0–100.0)
MONOS PCT: 12 %
Monocytes Absolute: 0.9 10*3/uL (ref 0.1–1.0)
Neutro Abs: 5.7 10*3/uL (ref 1.7–7.7)
Neutrophils Relative %: 73 %
Platelets: 152 10*3/uL (ref 150–400)
RBC: 3.79 MIL/uL — ABNORMAL LOW (ref 4.22–5.81)
RDW: 14.4 % (ref 11.5–15.5)
WBC: 7.7 10*3/uL (ref 4.0–10.5)

## 2015-01-08 LAB — BASIC METABOLIC PANEL
ANION GAP: 6 (ref 5–15)
BUN: 26 mg/dL — ABNORMAL HIGH (ref 6–20)
CALCIUM: 9 mg/dL (ref 8.9–10.3)
CO2: 24 mmol/L (ref 22–32)
CREATININE: 0.77 mg/dL (ref 0.61–1.24)
Chloride: 109 mmol/L (ref 101–111)
GFR calc Af Amer: >60 mL/min (ref >60)
GFR calc non Af Amer: >60 mL/min (ref >60)
Glucose, Bld: 130 mg/dL — ABNORMAL HIGH (ref 65–99)
Potassium: 3.5 mmol/L (ref 3.5–5.1)
SODIUM: 139 mmol/L (ref 135–145)

## 2015-01-08 MED ORDER — FUROSEMIDE 40 MG PO TABS: 40.0000 mg | ORAL_TABLET | ORAL | Status: DC

## 2015-01-08 MED ORDER — ZOLPIDEM TARTRATE 5 MG PO TABS: 5.0000 mg | ORAL_TABLET | Freq: Every evening | ORAL | Status: DC | PRN

## 2015-01-08 MED ORDER — FUROSEMIDE 40 MG PO TABS
40.0000 mg | ORAL_TABLET | Freq: Once | ORAL | Status: AC
  Administered 2015-01-08: 40 mg via ORAL
  Filled 2015-01-08: qty 1

## 2015-01-08 NOTE — ED Notes (Addendum)
Presents from home with hypertension. Patient of Dr. Percival Spanish who was told to come to ER or let him know if his BP gets over 150. THis AM around 12:30 it was 150/90 and continued to rise with each reading. Denies HA, dizziness and chest pain-endorses diaphoresis at night for the past few month. Alert and oriented. BP is usually in the 120s-family concerned he is not sleeping.

## 2015-01-08 NOTE — ED Provider Notes (Signed)
CSN: [id number]     Arrival date & time 01/08/15  0425 History   First MD Initiated Contact with Patient 01/08/15 0441     Chief Complaint  Patient presents with  . Hypertension     (Consider location/radiation/quality/duration/timing/severity/associated sxs/prior Treatment) Patient is a 81 y.o. male presenting with hypertension. The history is provided by the patient.  Hypertension  He monitors his blood pressure at home and, tonight, blood pressure got up to 037 systolic. He had been told to let his physician know if his blood pressure gets over 150, and was told that he could also come to the ED for this. He denies chest pain, headache, tinnitus, nosebleed, dyspnea. He does relate that he has been under stress and has been having difficulty sleeping. He is complaining of some swelling of his legs which makes him uncomfortable. He had been on hydrochlorothiazide but that was discontinued when he was hospitalized 6 weeks ago for a GI bleed. Apparently, he was noted to have hyponatremia at that time. He is anticoagulated on dabigatran for history of atrial fibrillation. He has resumed taking that. He states that on follow-up at his PCPs office, hemoglobin had come up to 11.6. Also, his wife states that his voice is intermittently hoarse ever since having an endoscopy when he was admitted to the hospital. He does have history of cigarette smoking in the past. He has not had any cough or weight loss.  Past Medical History  Diagnosis Date  . Hypertension   . Cataracts, bilateral   . Valvular heart disease     MV regurge and Ao stenosis  . Paroxysmal atrial fibrillation     On Pradaxa.   . Vertigo   . Transitional cell carcinoma of bladder     recurrent  . Prostate cancer 2004  . Hx of colonic polyps 2006,  2011  . Coronary artery disease   . Diverticulosis 2006  . Detached retina Twice    As a result he is lost vision in the left eye.   Past Surgical History  Procedure Laterality Date   . Pleural effusion drainage  2010  . Radioactive seed implant  2004    For treatment of prostate cancer  . Squamous cell carcinoma excision  2002, 2003, 2006.  Marland Kitchen Thoracentesis    . Colonoscopy  2006 and 11/2009    tubular adenomas on both studies. int rrhoids, diverticulosis.   . 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 Focal urothelial atypia in 2006.  . Cataract extraction    . Esophagogastroduodenoscopy N/A 11/15/2014    Procedure: ESOPHAGOGASTRODUODENOSCOPY (EGD);  Surgeon: Milus Banister, MD;  Location: [facility];  Service: Endoscopy;  Laterality: N/A;  . Colonoscopy N/A 11/15/2014    Procedure: COLONOSCOPY;  Surgeon: Milus Banister, MD;  Location: [place];  Service: Endoscopy;  Laterality: N/A;   Family History  Problem Relation Age of Onset  . Other Father 28     of a PE after surgery  . Other       there is no known coronary artery disease    Social History  Substance Use Topics  . Smoking status: Never Smoker   . Smokeless tobacco: Never Used  . Alcohol Use: Yes     Comment: occassionally    Review of Systems  All other systems reviewed and are negative.     Allergies  Diamox; Neptazane; Ciprofloxacin; Doxycycline; Erythromycin; and Penicillins  Home  Medications   Prior to Admission medications   Medication Sig Start Date End Date Taking? Authorizing Provider  acetaminophen (TYLENOL) 500 MG chewable tablet Chew 1,000 mg by mouth once. As needed    Historical Provider, MD  Calcium Polycarbophil (FIBER) 625 MG TABS Take 1 tablet by mouth daily.    Historical Provider, MD  clindamycin (CLEOCIN-T) 1 % gel Apply 1 application topically 2 (two) times daily.      Historical Provider, MD  dabigatran (PRADAXA) 150 MG CAPS Take 150 mg by mouth every 12 (twelve) hours.      Historical Provider, MD  lisinopril (PRINIVIL,ZESTRIL) 40 MG tablet Take 40 mg by mouth daily. 11/19/13   Historical  Provider, MD  loratadine (CLARITIN) 10 MG tablet Take 10 mg by mouth daily.    Historical Provider, MD  metroNIDAZOLE (METROCREAM) 0.75 % cream Apply 1 application topically 2 (two) times daily as needed.     Historical Provider, MD  Multiple Vitamins-Minerals (MULTIVITAMIN WITH MINERALS) tablet Take 1 tablet by mouth daily.    Historical Provider, MD  Omega-3 Fatty Acids (FISH OIL PO) Take 2 capsules by mouth daily.    Historical Provider, MD  omeprazole (PRILOSEC OTC) 20 MG tablet Take 20 mg by mouth daily.    Historical Provider, MD  triamcinolone (NASACORT ALLERGY 24HR) 55 MCG/ACT AERO nasal inhaler Place 1 spray into both nostrils daily.    Historical Provider, MD  valACYclovir (VALTREX) 1000 MG tablet Take 1 tablet by mouth 3 (three) times daily. 12/03/14   Historical Provider, MD  vitamin C (ASCORBIC ACID) 500 MG tablet Take 500 mg by mouth daily.    Historical Provider, MD   BP 161/90 mmHg  Pulse 94  Temp(Src) 98.2 F (36.8 C) (Oral)  Resp 24  SpO2 95% Physical Exam  Nursing note and vitals reviewed.  81 year old male, resting comfortably and in no acute distress. Vital signs are significant for hypertension and tachypnea. Oxygen saturation is 95%, which is normal. Head is normocephalic and atraumatic. PERRLA, EOMI. Oropharynx is clear. Neck is nontender and supple without adenopathy or JVD. Back is nontender and there is no CVA tenderness. Lungs are clear without rales, wheezes, or rhonchi. Chest is nontender. Heart has regular rate and rhythm with 3/6 holosystolic murmur heard throughout the precordium. Abdomen is soft, flat, nontender without masses or hepatosplenomegaly and peristalsis is normoactive. Extremities have 1+ edema, full range of motion is present. Skin is warm and dry without rash. Neurologic: Mental status is normal, cranial nerves are intact, there are no motor or sensory deficits.  ED Course  Procedures (including critical care time) Labs Review Results  for orders placed or performed during the hospital encounter of 11/25/46  Basic metabolic panel  Result Value Ref Range   Sodium 139 135 - 145 mmol/L   Potassium 3.5 3.5 - 5.1 mmol/L   Chloride 109 101 - 111 mmol/L   CO2 24 22 - 32 mmol/L   Glucose, Bld 130 (H) 65 - 99 mg/dL   BUN 26 (H) 6 - 20 mg/dL   Creatinine, Ser 0.77 0.61 - 1.24 mg/dL   Calcium 9.0 8.9 - 10.3 mg/dL   GFR calc non Af Amer >60 >60 mL/min   GFR calc Af Amer >60 >60 mL/min   Anion gap 6 5 - 15  CBC with Differential  Result Value Ref Range   WBC 7.7 4.0 - 10.5 K/uL   RBC 3.79 (L) 4.22 - 5.81 MIL/uL   Hemoglobin 11.2 (L) 13.0 -  17.0 g/dL   HCT 35.0 (L) 39.0 - 52.0 %   MCV 92.3 78.0 - 100.0 fL   MCH 29.6 26.0 - 34.0 pg   MCHC 32.0 30.0 - 36.0 g/dL   RDW 14.4 11.5 - 15.5 %   Platelets 152 150 - 400 K/uL   Neutrophils Relative % 73 %   Neutro Abs 5.7 1.7 - 7.7 K/uL   Lymphocytes Relative 14 %   Lymphs Abs 1.1 0.7 - 4.0 K/uL   Monocytes Relative 12 %   Monocytes Absolute 0.9 0.1 - 1.0 K/uL   Eosinophils Relative 1 %   Eosinophils Absolute 0.1 0.0 - 0.7 K/uL   Basophils Relative 0 %   Basophils Absolute 0.0 0.0 - 0.1 K/uL   Imaging Review Dg Chest 2 View  01/08/2015   CLINICAL DATA:  Hypertension, diaphoresis, insomnia. History of hypertension, pleural effusion, atrial fibrillation, prostate cancer.  EXAM: CHEST  2 VIEW  COMPARISON:  Chest radiograph March 19, 2010  FINDINGS: Cardiac silhouette is mildly enlarged, tortuous calcified aorta. Slightly increasing interstitial changes. Increasing small RIGHT greater LEFT pleural effusions. Patchy bibasilar airspace opacities. No pneumothorax. Multiple old LEFT rib fractures. Old distal LEFT clavicle fracture. Osteopenia. Soft tissue planes are nonsuspicious.  IMPRESSION: Mild cardiomegaly. Mildly increasing interstitial prominence suggests pulmonary edema, with small pleural effusions. Bibasilar atelectasis versus confluent edema or, less likely pneumonia.    Electronically Signed   By: Elon Alas M.D.   On: 01/08/2015 05:41   I have personally reviewed and evaluated these images and lab results as part of my medical decision-making.   MDM   Final diagnoses:  Peripheral edema  Insomnia  Essential hypertension  Normochromic normocytic anemia  Elevated BUN    Hypertension of which is not at a dangerous level. Old records are reviewed confirming recent hospitalization for GI bleed. Sodium was in the 128-132 range while in the hospital and hydrochlorothiazide was discontinued because of this hyponatremia. He clearly has some fluid overload and would benefit from going back on a diuretic. In an effort to minimize the possibility of recurrent hyponatremia, I anticipate sending him home on every other day furosemide. There is no indication for emergent change in his anti-hypertensive regimen. Because of recent change in his voice, chest x-ray will be obtained.  Laboratory workup shows elevated BUN relative to creatinine which would be consistent with congestive heart failure. Chest x-ray shows cardiomegaly and some pulmonary vascular congestion which is also consistent with congestive heart failure. On further questioning, he does have some orthopnea and exertional dyspnea. Hemoglobin has increased slightly from when he was discharged from the hospital. He is discharged with prescription for furosemide which he is to take every other day. For his insomnia, he is given a prescription for a small number of zolpidem. He has an appointment with his cardiologist next week and diaphoretic dose can be adjusted at that point. He is encouraged to weigh himself daily in addition to continue blood pressure monitoring.  Delora Fuel, MD 59/74/16 [id number]

## 2015-01-08 NOTE — Discharge Instructions (Signed)
Continue to monitor your blood pressure. Bring a record of your blood pressure readings with you when you see your cardiologist or your primary care provider. Also, will yourself every day and keep a record of it. Your weight will be a guide to have a fluid you are retaining or losing.  Edema Edema is an abnormal buildup of fluids in your bodytissues. Edema is somewhatdependent on gravity to pull the fluid to the lowest place in your body. That makes the condition more common in the legs and thighs (lower extremities). Painless swelling of the feet and ankles is common and becomes more likely as you get older. It is also common in looser tissues, like around your eyes.  When the affected area is squeezed, the fluid may move out of that spot and leave a dent for a few moments. This dent is called pitting.  CAUSES  There are many possible causes of edema. Eating too much salt and being on your feet or sitting for a long time can cause edema in your legs and ankles. Hot weather may make edema worse. Common medical causes of edema include:  Heart failure.  Liver disease.  Kidney disease.  Weak blood vessels in your legs.  Cancer.  An injury.  Pregnancy.  Some medications.  Obesity. SYMPTOMS  Edema is usually painless.Your skin may look swollen or shiny.  DIAGNOSIS  Your health care provider may be able to diagnose edema by asking about your medical history and doing a physical exam. You may need to have tests such as X-rays, an electrocardiogram, or blood tests to check for medical conditions that may cause edema.  TREATMENT  Edema treatment depends on the cause. If you have heart, liver, or kidney disease, you need the treatment appropriate for these conditions. General treatment may include:  Elevation of the affected body part above the level of your heart.  Compression of the affected body part. Pressure from elastic bandages or support stockings squeezes the tissues and forces  fluid back into the blood vessels. This keeps fluid from entering the tissues.  Restriction of fluid and salt intake.  Use of a water pill (diuretic). These medications are appropriate only for some types of edema. They pull fluid out of your body and make you urinate more often. This gets rid of fluid and reduces swelling, but diuretics can have side effects. Only use diuretics as directed by your health care provider. HOME CARE INSTRUCTIONS   Keep the affected body part above the level of your heart when you are lying down.   Do not sit still or stand for prolonged periods.   Do not put anything directly under your knees when lying down.  Do not wear constricting clothing or garters on your upper legs.   Exercise your legs to work the fluid back into your blood vessels. This may help the swelling go down.   Wear elastic bandages or support stockings to reduce ankle swelling as directed by your health care provider.   Eat a low-salt diet to reduce fluid if your health care provider recommends it.   Only take medicines as directed by your health care provider. SEEK MEDICAL CARE IF:   Your edema is not responding to treatment.  You have heart, liver, or kidney disease and notice symptoms of edema.  You have edema in your legs that does not improve after elevating them.   You have sudden and unexplained weight gain. SEEK IMMEDIATE MEDICAL CARE IF:   You develop  shortness of breath or chest pain.   You cannot breathe when you lie down.  You develop pain, redness, or warmth in the swollen areas.   You have heart, liver, or kidney disease and suddenly get edema.  You have a fever and your symptoms suddenly get worse. MAKE SURE YOU:   Understand these instructions.  Will watch your condition.  Will get help right away if you are not doing well or get worse. Document Released: 03/30/2005 Document Revised: 08/14/2013 Document Reviewed: 01/20/2013 [facility] Patient  Information 2015 [place], [place]. This information is not intended to replace advice given to you by your health care provider. Make sure you discuss any questions you have with your health care provider.  Insomnia Insomnia is frequent trouble falling and/or staying asleep. Insomnia can be a long term problem or a short term problem. Both are common. Insomnia can be a short term problem when the wakefulness is related to a certain stress or worry. Long term insomnia is often related to ongoing stress during waking hours and/or poor sleeping habits. Overtime, sleep deprivation itself can make the problem worse. Every little thing feels more severe because you are overtired and your ability to cope is decreased. CAUSES   Stress, anxiety, and depression.  Poor sleeping habits.  Distractions such as TV in the bedroom.  Naps close to bedtime.  Engaging in emotionally charged conversations before bed.  Technical reading before sleep.  Alcohol and other sedatives. They may make the problem worse. They can hurt normal sleep patterns and normal dream activity.  Stimulants such as caffeine for several hours prior to bedtime.  Pain syndromes and shortness of breath can cause insomnia.  Exercise late at night.  Changing time zones may cause sleeping problems (jet lag). It is sometimes helpful to have someone observe your sleeping patterns. They should look for periods of not breathing during the night (sleep apnea). They should also look to see how long those periods last. If you live alone or observers are uncertain, you can also be observed at a sleep clinic where your sleep patterns will be professionally monitored. Sleep apnea requires a checkup and treatment. Give your caregivers your medical history. Give your caregivers observations your family has made about your sleep.  SYMPTOMS   Not feeling rested in the morning.  Anxiety and restlessness at bedtime.  Difficulty falling and staying  asleep. TREATMENT   Your caregiver may prescribe treatment for an underlying medical disorders. Your caregiver can give advice or help if you are using alcohol or other drugs for self-medication. Treatment of underlying problems will usually eliminate insomnia problems.  Medications can be prescribed for short time use. They are generally not recommended for lengthy use.  Over-the-counter sleep medicines are not recommended for lengthy use. They can be habit forming.  You can promote easier sleeping by making lifestyle changes such as:  Using relaxation techniques that help with breathing and reduce muscle tension.  Exercising earlier in the day.  Changing your diet and the time of your last meal. No night time snacks.  Establish a regular time to go to bed.  Counseling can help with stressful problems and worry.  Soothing music and white noise may be helpful if there are background noises you cannot remove.  Stop tedious detailed work at least one hour before bedtime. HOME CARE INSTRUCTIONS   Keep a diary. Inform your caregiver about your progress. This includes any medication side effects. See your caregiver regularly. Take note of:  Times  when you are asleep.  Times when you are awake during the night.  The quality of your sleep.  How you feel the next day. This information will help your caregiver care for you.  Get out of bed if you are still awake after 15 minutes. Read or do some quiet activity. Keep the lights down. Wait until you feel sleepy and go back to bed.  Keep regular sleeping and waking hours. Avoid naps.  Exercise regularly.  Avoid distractions at bedtime. Distractions include watching television or engaging in any intense or detailed activity like attempting to balance the household checkbook.  Develop a bedtime ritual. Keep a familiar routine of bathing, brushing your teeth, climbing into bed at the same time each night, listening to soothing music.  Routines increase the success of falling to sleep faster.  Use relaxation techniques. This can be using breathing and muscle tension release routines. It can also include visualizing peaceful scenes. You can also help control troubling or intruding thoughts by keeping your mind occupied with boring or repetitive thoughts like the old concept of counting sheep. You can make it more creative like imagining planting one beautiful flower after another in your backyard garden.  During your day, work to eliminate stress. When this is not possible use some of the previous suggestions to help reduce the anxiety that accompanies stressful situations. MAKE SURE YOU:   Understand these instructions.  Will watch your condition.  Will get help right away if you are not doing well or get worse. Document Released: 03/27/2000 Document Revised: 06/22/2011 Document Reviewed: 04/27/2007 [facility] Patient Information 2015 [place], [place]. This information is not intended to replace advice given to you by your health care provider. Make sure you discuss any questions you have with your health care provider.  Furosemide tablets What is this medicine? FUROSEMIDE (fyoor [place] se mide) is a diuretic. It helps you make more urine and to lose salt and excess water from your body. This medicine is used to treat high blood pressure, and edema or swelling from heart, kidney, or liver disease. This medicine may be used for other purposes; ask your health care provider or pharmacist if you have questions. COMMON BRAND NAME(S): Delone, Lasix What should I tell my health care provider before I take this medicine? They need to know if you have any of these conditions: -abnormal blood electrolytes -diarrhea or vomiting -gout -heart disease -kidney disease, small amounts of urine, or difficulty passing urine -liver disease -an unusual or allergic reaction to furosemide, sulfa drugs, other medicines, foods, dyes, or  preservatives -pregnant or trying to get pregnant -breast-feeding How should I use this medicine? Take this medicine by mouth with a glass of water. Follow the directions on the prescription label. You may take this medicine with or without food. If it upsets your stomach, take it with food or milk. Do not take your medicine more often than directed. Remember that you will need to pass more urine after taking this medicine. Do not take your medicine at a time of day that will cause you problems. Do not take at bedtime. Talk to your pediatrician regarding the use of this medicine in children. While this drug may be prescribed for selected conditions, precautions do apply. Overdosage: If you think you have taken too much of this medicine contact a poison control center or emergency room at once. NOTE: This medicine is only for you. Do not share this medicine with others. What if I miss a dose? If  you miss a dose, take it as soon as you can. If it is almost time for your next dose, take only that dose. Do not take double or extra doses. What may interact with this medicine? -aspirin and aspirin-like medicines -certain antibiotics -chloral hydrate -cisplatin -cyclosporine -digoxin -diuretics -laxatives -lithium -medicines for blood pressure -medicines that relax muscles for surgery -methotrexate -NSAIDs, medicines for pain and inflammation like ibuprofen, naproxen, or indomethacin -phenytoin -steroid medicines like prednisone or cortisone -sucralfate This list may not describe all possible interactions. Give your health care provider a list of all the medicines, herbs, non-prescription drugs, or dietary supplements you use. Also tell them if you smoke, drink alcohol, or use illegal drugs. Some items may interact with your medicine. What should I watch for while using this medicine? Visit your doctor or health care professional for regular checks on your progress. Check your blood pressure  regularly. Ask your doctor or health care professional what your blood pressure should be, and when you should contact him or her. If you are a diabetic, check your blood sugar as directed. You may need to be on a special diet while taking this medicine. Check with your doctor. Also, ask how many glasses of fluid you need to drink a day. You must not get dehydrated. You may get drowsy or dizzy. Do not drive, use machinery, or do anything that needs mental alertness until you know how this drug affects you. Do not stand or sit up quickly, especially if you are an older patient. This reduces the risk of dizzy or fainting spells. Alcohol can make you more drowsy and dizzy. Avoid alcoholic drinks. This medicine can make you more sensitive to the sun. Keep out of the sun. If you cannot avoid being in the sun, wear protective clothing and use sunscreen. Do not use sun lamps or tanning beds/booths. What side effects may I notice from receiving this medicine? Side effects that you should report to your doctor or health care professional as soon as possible: -blood in urine or stools -dry mouth -fever or chills -hearing loss or ringing in the ears -irregular heartbeat -muscle pain or weakness, cramps -skin rash -stomach upset, pain, or nausea -tingling or numbness in the hands or feet -unusually weak or tired -vomiting or diarrhea -yellowing of the eyes or skin Side effects that usually do not require medical attention (report to your doctor or health care professional if they continue or are bothersome): -headache -loss of appetite -unusual bleeding or bruising This list may not describe all possible side effects. Call your doctor for medical advice about side effects. You may report side effects to FDA at 1-800-FDA-1088. Where should I keep my medicine? Keep out of the reach of children. Store at room temperature between 15 and 30 degrees C (59 and 86 degrees F). Protect from light. Throw away any  unused medicine after the expiration date. NOTE: This sheet is a summary. It may not cover all possible information. If you have questions about this medicine, talk to your doctor, pharmacist, or health care provider.  2015, Elsevier/Gold Standard. (2009-03-18 16:24:50)  Zolpidem tablets What is this medicine? ZOLPIDEM (zole PI dem) is used to treat insomnia. This medicine helps you to fall asleep and sleep through the night. This medicine may be used for other purposes; ask your health care provider or pharmacist if you have questions. COMMON BRAND NAME(S): Ambien What should I tell my health care provider before I take this medicine? They need to know  if you have any of these conditions: -depression -history of a drug or alcohol abuse problem -liver disease -lung or breathing disease -suicidal thoughts -an unusual or allergic reaction to zolpidem, other medicines, foods, dyes, or preservatives -pregnant or trying to get pregnant -breast-feeding How should I use this medicine? Take this medicine by mouth with a glass of water. Follow the directions on the prescription label. It is better to take this medicine on an empty stomach and only when you are ready for bed. Do not take your medicine more often than directed. If you have been taking this medicine for several weeks and suddenly stop taking it, you may get unpleasant withdrawal symptoms. Your doctor or health care professional may want to gradually reduce the dose. Do not stop taking this medicine on your own. Always follow your doctor or health care professional's advice. A special MedGuide will be given to you by the pharmacist with each prescription and refill. Be sure to read this information carefully each time. Talk to your pediatrician regarding the use of this medicine in children. Special care may be needed. Overdosage: If you think you have taken too much of this medicine contact a poison control center or emergency room at  once. NOTE: This medicine is only for you. Do not share this medicine with others. What if I miss a dose? This does not apply. This medicine should only be taken immediately before going to sleep. Do not take double or extra doses. What may interact with this medicine? -herbal medicines like kava kava, melatonin, St. John's wort and valerian -medicines for fungal infections like ketoconazole, fluconazole, or itraconazole -medicines for treating depression or other mental problems -other medicines given for sleep -some medicines for Parkinson' s disease or other movement disorders -some medicines used to treat HIV infection or AIDS, like ritonavir This list may not describe all possible interactions. Give your health care provider a list of all the medicines, herbs, non-prescription drugs, or dietary supplements you use. Also tell them if you smoke, drink alcohol, or use illegal drugs. Some items may interact with your medicine. What should I watch for while using this medicine? Visit your doctor or health care professional for regular checks on your progress. Keep a regular sleep schedule by going to bed at about the same time each night. Avoid caffeine-containing drinks in the evening hours. When sleep medicines are used every night for more than a few weeks, they may stop working. Talk to your doctor if you still have trouble sleeping. Do not take this medicine unless you are able to get a full night's sleep before you must be active again. You may not be able to remember things that you do in the hours after you take this medicine. Some people have reported driving, making phone calls, or preparing and eating food while asleep after taking sleep medicine. Take this medicine right before going to sleep. Tell your doctor if you are have any problems with your memory. After you stop taking this medicine, you may have trouble falling asleep. This is called rebound insomnia. This problem usually goes  away on its own after 1 or 2 nights. You may get drowsy or dizzy. Do not drive, use machinery, or do anything that needs mental alertness until you know how this medicine affects you. Do not stand or sit up quickly, especially if you are an older patient. This reduces the risk of dizzy or fainting spells. Alcohol may interfere with the effect of this medicine.  Avoid alcoholic drinks. This medicine may cause a decrease in mental alertness the morning after use, even if you feel that you are fully awake. Tell your doctor if you will need to perform activities requiring full alertness, such as driving, the next morning after you have taken this medicine. If you or your family notice any changes in your behavior, or if you have any unusual or disturbing thoughts, call your doctor right away. What side effects may I notice from receiving this medicine? Side effects that you should report to your doctor or health care professional as soon as possible: -allergic reactions like skin rash, itching or hives, swelling of the face, lips, or tongue -changes in vision -confusion -depressed mood -feeling faint or lightheaded, falls -hallucinations -problems with balance, speaking, walking -restlessness, excitability, or feelings of agitation -unusual activities while asleep like driving, eating, making phone calls Side effects that usually do not require medical attention (report to your doctor or health care professional if they continue or are bothersome): -diarrhea -dizziness, or daytime drowsiness, sometimes called a hangover effect -headache This list may not describe all possible side effects. Call your doctor for medical advice about side effects. You may report side effects to FDA at 1-800-FDA-1088. Where should I keep my medicine? Keep out of the reach of children. This medicine can be abused. Keep your medicine in a safe place to protect it from theft. Do not share this medicine with anyone. Selling  or giving away this medicine is dangerous and against the law. Store at room temperature between 20 and 25 degrees C (68 and 77 degrees F). Throw away any unused medicine after the expiration date. NOTE: This sheet is a summary. It may not cover all possible information. If you have questions about this medicine, talk to your doctor, pharmacist, or health care provider.  2015, Elsevier/Gold Standard. (2012-03-15 16:54:48)

## 2015-01-17 ENCOUNTER — Ambulatory Visit (INDEPENDENT_AMBULATORY_CARE_PROVIDER_SITE_OTHER): Payer: Medicare Other | Admitting: Cardiology

## 2015-01-17 ENCOUNTER — Encounter: Payer: Self-pay | Admitting: Cardiology

## 2015-01-17 VITALS — BP 120/62 | HR 88 | Ht 69.0 in | Wt 196.0 lb

## 2015-01-17 DIAGNOSIS — R609 Edema, unspecified: Secondary | ICD-10-CM

## 2015-01-17 DIAGNOSIS — Z79899 Other long term (current) drug therapy: Secondary | ICD-10-CM

## 2015-01-17 LAB — BASIC METABOLIC PANEL
BUN: 16 mg/dL (ref 7–25)
CALCIUM: 8.8 mg/dL (ref 8.6–10.3)
CO2: 24 mmol/L (ref 20–31)
Chloride: 99 mmol/L (ref 98–110)
Creat: 0.74 mg/dL (ref 0.70–1.11)
GLUCOSE: 89 mg/dL (ref 65–99)
Potassium: 4.5 mmol/L (ref 3.5–5.3)
Sodium: 135 mmol/L (ref 135–146)

## 2015-01-17 NOTE — Patient Instructions (Addendum)
Your physician recommends that you schedule a follow-up appointment in: 6 Months  Your physician recommends that you return for lab work in: BMP  How to Use Compression Stockings Compression stockings are elastic socks that squeeze the legs. They help to increase blood flow to the legs, decrease swelling in the legs, and reduce the chance of developing blood clots in the lower legs. Compression stockings are often used by people who:  Are recovering from surgery.  Have poor circulation in their legs.  Are prone to getting blood clots in their legs.  Have varicose veins.  Sit or stay in bed for long periods of time. HOW TO USE COMPRESSION STOCKINGS Before you put on your compression stockings:  Make sure that they are the correct size. If you do not know your size, ask your health care provider.  Make sure that they are clean, dry, and in good condition.  Check them for rips and tears. Do not put them on if they are ripped or torn. Put your stockings on first thing in the morning, before you get out of bed. Keep them on for as long as your health care provider advises. When you are wearing your stockings:  Keep them as smooth as possible. Do not allow them to bunch up. It is especially important to prevent the stockings from bunching up around your toes or behind your knees.  Do not roll the stockings downward and leave them rolled down. This can decrease blood flow to your leg.  Change them right away if they become wet or dirty. When you take off your stockings, inspect your legs and feet. Anything that does not seem normal may require medical attention. Look for:  Open sores.  Red spots.  Swelling. INFORMATION AND TIPS  Do not stop wearing your compression stockings without talking to your health care provider first.  Wash your stockings everyday with mild detergent in cold or warm water. Do not use bleach. Air-dry your stockings or dry them in a clothes dryer on low  heat.  Replace your stockings every 3-6 months.  If skin moisturizing is part of your treatment plan, apply lotion or cream at night so that your skin will be dry when you put on the stockings in the morning. It is harder to put the stockings on when you have lotion on your legs or feet. SEEK MEDICAL CARE IF: Remove your stockings and seek medical care if:  You have a feeling of pins and needles in your feet or legs.  You have any new changes in your skin.  You have skin lesions that are getting worse.  You have swelling or pain that is getting worse. SEEK IMMEDIATE MEDICAL CARE IF:  You have numbness or tingling in your lower legs that does not get better immediately after you take the stockings off.  Your toes or feet become cold and blue.  You develop open sores or red spots on your legs that do not go away.  You see or feel a warm spot on your leg.  You have new swelling or soreness in your leg.  You are short of breath or you have chest pain for no reason.  You have a rapid or irregular heartbeat.  You feel light-headed or dizzy.   This information is not intended to replace advice given to you by your health care provider. Make sure you discuss any questions you have with your health care provider.   Document Released: 01/25/2009 Document Revised: 08/14/2014  Document Reviewed: 03/07/2014 Elsevier Interactive Patient Education [employer].

## 2015-01-17 NOTE — Progress Notes (Signed)
HPI The patient presents for follow of MR, AS and atrial fibrillation.   He was in the emergency room and of last month and I reviewed these records. He has some volume overload. He was sent home on every other day Lasix. He comes back today. He is very slow getting around a somewhat frail. However, he's not had any acute shortness of breath. Denies any PND or orthopnea. He's had no palpitations, presyncope or syncope. He has no chest pressure, neck or arm discomfort. He has had some increased lower extremity swelling is not keeping his feet up as much as I would like. He's not had any symptomatic tachypalpitations, presyncope or syncope. His weights have been relatively stable.   Allergies  Allergen Reactions  . Ambien [Zolpidem Tartrate] Other (See Comments)    Per patient and wife "hallucinations and sleep walking" (tried to climb out of the windows in the middle of the night)  . Diamox [Acetazolamide]     unknown  . Neptazane [Methazolamide]     unknown  . Ciprofloxacin Rash  . Doxycycline Rash  . Erythromycin Rash  . Penicillins Swelling and Rash    Current Outpatient Prescriptions  Medication Sig Dispense Refill  . acetaminophen (TYLENOL) 500 MG chewable tablet Chew 1,000 mg by mouth once. As needed    . Calcium Polycarbophil (FIBER) 625 MG TABS Take 1 tablet by mouth daily.    . clindamycin (CLEOCIN-T) 1 % gel Apply 1 application topically 2 (two) times daily.      . dabigatran (PRADAXA) 150 MG CAPS Take 150 mg by mouth every 12 (twelve) hours.      . furosemide (LASIX) 40 MG tablet Take 1 tablet (40 mg total) by mouth every other day. 30 tablet 0  . lisinopril (PRINIVIL,ZESTRIL) 40 MG tablet Take 40 mg by mouth daily.    Marland Kitchen loratadine (CLARITIN) 10 MG tablet Take 10 mg by mouth daily.    . metroNIDAZOLE (METROCREAM) 0.75 % cream Apply 1 application topically 2 (two) times daily as needed.     . Multiple Vitamins-Minerals (MULTIVITAMIN WITH MINERALS) tablet Take 1 tablet by mouth  daily.    . Omega-3 Fatty Acids (FISH OIL PO) Take 2 capsules by mouth daily.    Marland Kitchen omeprazole (PRILOSEC) 40 MG capsule Take 40 mg by mouth daily.  3  . triamcinolone (NASACORT ALLERGY 24HR) 55 MCG/ACT AERO nasal inhaler Place 1 spray into both nostrils daily.    . vitamin C (ASCORBIC ACID) 500 MG tablet Take 500 mg by mouth daily.     No current facility-administered medications for this visit.    Past Medical History  Diagnosis Date  . Hypertension   . Cataracts, bilateral   . Valvular heart disease     MV regurge and Ao stenosis  . Paroxysmal atrial fibrillation (HCC)     On Pradaxa.   . Vertigo   . 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 Prostate cancer ([place]) 2004  . Hx of colonic polyps 2006,  2011  . Coronary artery disease   . Diverticulosis 2006  . Detached retina Twice    As a result he is lost vision in the left eye.    Past Surgical History  Procedure Laterality Date  . Pleural effusion drainage  2010  . Radioactive seed implant  2004    For treatment of prostate cancer  . Squamous cell carcinoma excision  2002, 2003, 2006.  Marland Kitchen Thoracentesis    . Colonoscopy  2006 and 11/2009    tubular adenomas on both studies. int rrhoids, diverticulosis.   . 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 Focal urothelial atypia in 2006.  . Cataract extraction    . Esophagogastroduodenoscopy N/A 11/15/2014    Procedure: ESOPHAGOGASTRODUODENOSCOPY (EGD);  Surgeon: Milus Banister, MD;  Location: [place];  Service: Endoscopy;  Laterality: N/A;  . Colonoscopy N/A 11/15/2014    Procedure: COLONOSCOPY;  Surgeon: Milus Banister, MD;  Location: [place];  Service: Endoscopy;  Laterality: N/A;    ROS:  As stated in the HPI and negative for all other systems.  PHYSICAL EXAM BP 120/62 mmHg  Pulse 88  Ht 5\' 9"  (1.753 m)  Wt 196 lb (88.905 kg)  BMI 28.93 kg/m2 GENERAL:  Well  appearing HEENT:  Fundi not visualized, oral mucosa unremarkable, disconjugate gaze with anisocoria.   NECK:  No jugular venous distention, waveform within normal limits, carotid upstroke brisk and symmetric, no bruits, no thyromegaly LUNGS:  Clear to auscultation bilaterally CHEST:  Unremarkable HEART:  PMI not displaced or sustained,S1 and S2 within normal limits, no S3, no S4, no clicks, no rubs, apical holosystolic murmur and murmur radiating out the aortic outflow tract. ABD:  Flat, positive bowel sounds normal in frequency in pitch, no bruits, no rebound, no guarding, no midline pulsatile mass, no hepatomegaly, no splenomegaly, varicose veins. EXT:  2 plus pulses throughout, mild right greater than left edema, no cyanosis no clubbing    ASSESSMENT AND PLAN  Paroxysmal atrial fibrillation -  He has had no symptomatic paroxysms. He tolerates the Pradaxa and he will continue with this unless he has recurrent GI bleeding.    Mitral regurgitation Follow this up with an echo last month and he is still has moderate regurgitation.  Aortic stenosis -  He has severe AS with a mean gradient in the 40s. It has progressed slightly. I did talk with the patient, his wife and his son about the option of TAVR.  However, he would prefer to wait until he has more overt symptoms understanding at some point he might become ineligible for even this procedure. Should that be the natural progression he has family understand and would be comfortable with that.  Hypertension -  His blood pressure has been labile and I reviewed the readings at home. It has come back down from his presentation in the emergency room when he did have some shortness of breath, edema and hypertension. For now he will continue on the meds as listed. I will check a basic metabolic profile.  Edema - I will prescribed compression stockings.

## 2015-01-21 ENCOUNTER — Telehealth: Payer: Self-pay | Admitting: Cardiology

## 2015-01-21 NOTE — Telephone Encounter (Signed)
Returned call to patient.Bmet results given.Advised Hgb was not done.Pt requested results to be faxed to him at fax # [phone].Results faxed.

## 2015-01-21 NOTE — Telephone Encounter (Signed)
Pt called in stating that he had some labs drawn for his Hemoglobin and he would like to know of those results were available. Please call  Thanks

## 2015-03-15 ENCOUNTER — Ambulatory Visit (INDEPENDENT_AMBULATORY_CARE_PROVIDER_SITE_OTHER): Payer: Medicare Other | Admitting: Cardiology

## 2015-03-15 ENCOUNTER — Encounter: Payer: Self-pay | Admitting: Cardiology

## 2015-03-15 VITALS — BP 102/72 | HR 76 | Ht 70.0 in | Wt 182.2 lb

## 2015-03-15 DIAGNOSIS — I350 Nonrheumatic aortic (valve) stenosis: Secondary | ICD-10-CM

## 2015-03-15 NOTE — Progress Notes (Signed)
HPI The patient presents for follow of MR, AS and atrial fibrillation.   He was in the emergency room several weeks ago with volume overload. He was sent home on every other day Lasix  I saw him back and we discussed considering TAVR.  Since I last saw him he has done better.  He is really watching salt and fluid.  He is now only taking Lasix PRN.  Marland Kitchen He has not had any acute shortness of breath. He denies any PND or orthopnea. He's had no palpitations, presyncope or syncope. He has no chest pressure, neck or arm discomfort. He has had no lower extremity swelling He's not had any symptomatic tachypalpitations, presyncope or syncope  Allergies  Allergen Reactions  . Ambien [Zolpidem Tartrate] Other (See Comments)    Per patient and wife "hallucinations and sleep walking" (tried to climb out of the windows in the middle of the night)  . Diamox [Acetazolamide]     unknown  . Neptazane [Methazolamide]     unknown  . Ciprofloxacin Rash  . Doxycycline Rash  . Erythromycin Rash  . Penicillins Swelling and Rash    Current Outpatient Prescriptions  Medication Sig Dispense Refill  . acetaminophen (TYLENOL) 500 MG chewable tablet Chew 1,000 mg by mouth once. As needed    . Calcium Polycarbophil (FIBER) 625 MG TABS Take 1 tablet by mouth daily.    . clindamycin (CLEOCIN-T) 1 % gel Apply 1 application topically 2 (two) times daily.      . dabigatran (PRADAXA) 150 MG CAPS Take 150 mg by mouth every 12 (twelve) hours.      . furosemide (LASIX) 40 MG tablet Take 1 tablet (40 mg total) by mouth every other day. 30 tablet 0  . lisinopril (PRINIVIL,ZESTRIL) 40 MG tablet Take 40 mg by mouth daily.    Marland Kitchen loratadine (CLARITIN) 10 MG tablet Take 10 mg by mouth daily.    . metroNIDAZOLE (METROCREAM) 0.75 % cream Apply 1 application topically 2 (two) times daily as needed.     . Multiple Vitamins-Minerals (MULTIVITAMIN WITH MINERALS) tablet Take 1 tablet by mouth daily.    . Omega-3 Fatty Acids (FISH OIL PO) Take  2 capsules by mouth daily.    Marland Kitchen omeprazole (PRILOSEC) 40 MG capsule Take 40 mg by mouth daily.  3  . triamcinolone (NASACORT ALLERGY 24HR) 55 MCG/ACT AERO nasal inhaler Place 1 spray into both nostrils daily.    . vitamin C (ASCORBIC ACID) 500 MG tablet Take 500 mg by mouth daily.     No current facility-administered medications for this visit.    Past Medical History  Diagnosis Date  . Hypertension   . Cataracts, bilateral   . Valvular heart disease     MV regurge and Ao stenosis  . Paroxysmal atrial fibrillation (HCC)     On Pradaxa.   . Vertigo   . 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 Prostate cancer ([place]) 2004  . Hx of colonic polyps 2006,  2011  . Coronary artery disease   . Diverticulosis 2006  . Detached retina Twice    As a result he is lost vision in the left eye.    Past Surgical History  Procedure Laterality Date  . Pleural effusion drainage  2010  . Radioactive seed implant  2004    For treatment of prostate cancer  . Squamous cell carcinoma excision  2002, 2003, 2006.  Marland Kitchen Thoracentesis    . Colonoscopy  2006  and 11/2009    tubular adenomas on both studies. int rrhoids, diverticulosis.   . 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 Focal urothelial atypia in 2006.  . Cataract extraction    . Esophagogastroduodenoscopy N/A 11/15/2014    Procedure: ESOPHAGOGASTRODUODENOSCOPY (EGD);  Surgeon: Milus Banister, MD;  Location: [facility];  Service: Endoscopy;  Laterality: N/A;  . Colonoscopy N/A 11/15/2014    Procedure: COLONOSCOPY;  Surgeon: Milus Banister, MD;  Location: [place];  Service: Endoscopy;  Laterality: N/A;    ROS:  As stated in the HPI and negative for all other systems.  PHYSICAL EXAM BP 102/72 mmHg  Pulse 76  Ht 5\' 10"  (1.778 m)  Wt 182 lb 3 oz (82.64 kg)  BMI 26.14 kg/m2 GENERAL:  Well appearing HEENT:  Fundi not visualized, oral mucosa  unremarkable, disconjugate gaze with anisocoria.   NECK:  No jugular venous distention, waveform within normal limits, carotid upstroke brisk and symmetric, no bruits, no thyromegaly LUNGS:  Clear to auscultation bilaterally CHEST:  Unremarkable HEART:  PMI not displaced or sustained,S1 and S2 within normal limits, no S3, no S4, no clicks, no rubs, apical holosystolic murmur and murmur radiating out the aortic outflow tract. ABD:  Flat, positive bowel sounds normal in frequency in pitch, no bruits, no rebound, no guarding, no midline pulsatile mass, no hepatomegaly, no splenomegaly, varicose veins. EXT:  2 plus pulses throughout, mild right greater than left edema, no cyanosis no clubbing    ASSESSMENT AND PLAN  Paroxysmal atrial fibrillation -  He has had no symptomatic paroxysms. He tolerates the Pradaxa and he will continue with this unless he has recurrent GI bleeding.    Mitral regurgitation This is moderate regurgitation and we will follow this clinically.   Aortic stenosis -  He has severe AS with a mean gradient in the 40s and moderate MR with a presentation earlier this year with volume overload. It has progressed slightly. I again talked with the patient, his wife and his son about the option of TAVR. He would like to consider this option and I will set him up to see either Dr. Burt Knack or Dr. Angelena Form  Hypertension -  His blood pressure seems to be more stable.  He will continue the meds as listed.

## 2015-03-15 NOTE — Patient Instructions (Signed)
You have been referred to Dr Burt Knack or Dr Angelena Form

## 2015-03-20 ENCOUNTER — Encounter: Payer: Self-pay | Admitting: *Deleted

## 2015-03-20 ENCOUNTER — Ambulatory Visit (INDEPENDENT_AMBULATORY_CARE_PROVIDER_SITE_OTHER): Payer: Medicare Other | Admitting: Cardiovascular Disease

## 2015-03-20 ENCOUNTER — Encounter: Payer: Self-pay | Admitting: Cardiovascular Disease

## 2015-03-20 VITALS — BP 112/76 | HR 77 | Wt 184.0 lb

## 2015-03-20 DIAGNOSIS — I350 Nonrheumatic aortic (valve) stenosis: Secondary | ICD-10-CM | POA: Diagnosis not present

## 2015-03-20 NOTE — Progress Notes (Addendum)
Chief Complaint  Patient presents with  . New Evaluation    TAVR CONSULT      History of Present Illness: 81 yo male with history of HTN, atrial fibrillation on Pradaxa, bladder cancer, prostate cancer and valvular heart disease who is here today for evaluation of severe aortic valve stenosis. He is followed in our office by Dr. Percival Spanish. He is very active. He exercises 4-5 days per week at the [facility]. He has no chest pain or SOB. He has had recent swelling in both legs. No dizziness, near syncope or syncope. Echo 12/20/14 with mean gradient 47 mmHg, peak gradient 102 mm Hg, AVA 0.47cm2. Normal LVEF. He was also noted to have moderate mitral valve regurgitation. He has had no prior cardiac catheterization. No known CAD. 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 He is here today with his wife and son.   Primary Care Physician: Geoffery Lyons, MD   Past Medical History  Diagnosis Date  . Hypertension   . Cataracts, bilateral   . Valvular heart disease     MV regurge and Ao stenosis  . Paroxysmal atrial fibrillation (HCC)     On Pradaxa.   . Vertigo   . 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 Prostate cancer ([place]) 2004  . Hx of colonic polyps 2006,  2011  . Diverticulosis 2006  . Detached retina Twice    As a result he is lost vision in the left eye.    Past Surgical History  Procedure Laterality Date  . Pleural effusion drainage  2010  . Radioactive seed implant  2004    For treatment of prostate cancer  . Squamous cell carcinoma excision  2002, 2003, 2006.  Marland Kitchen Thoracentesis    . Colonoscopy  2006 and 11/2009    tubular adenomas on both studies. int rrhoids, diverticulosis.   . 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 Focal urothelial atypia in 2006.  . Cataract extraction    . Esophagogastroduodenoscopy N/A 11/15/2014    Procedure: ESOPHAGOGASTRODUODENOSCOPY  (EGD);  Surgeon: Milus Banister, MD;  Location: [facility];  Service: Endoscopy;  Laterality: N/A;  . Colonoscopy N/A 11/15/2014    Procedure: COLONOSCOPY;  Surgeon: Milus Banister, MD;  Location: [facility];  Service: Endoscopy;  Laterality: N/A;  . Tonsillectomy      Current Outpatient Prescriptions  Medication Sig Dispense Refill  . acetaminophen (TYLENOL) 500 MG chewable tablet Chew 1,000 mg by mouth once. As needed    . Calcium Polycarbophil (FIBER) 625 MG TABS Take 1 tablet by mouth daily.    . clindamycin (CLEOCIN-T) 1 % gel Apply 1 application topically 2 (two) times daily.      . dabigatran (PRADAXA) 150 MG CAPS Take 150 mg by mouth every 12 (twelve) hours.      . furosemide (LASIX) 40 MG tablet Take 1 tablet (40 mg total) by mouth every other day. (Patient taking differently: Take 40 mg by mouth as needed. ) 30 tablet 0  . lisinopril (PRINIVIL,ZESTRIL) 40 MG tablet Take 40 mg by mouth daily.    Marland Kitchen loratadine (CLARITIN) 10 MG tablet Take 10 mg by mouth daily.    . metroNIDAZOLE (METROCREAM) 0.75 % cream Apply 1 application topically 2 (two) times daily as needed.     . Multiple Vitamins-Minerals (MULTIVITAMIN WITH MINERALS) tablet Take 1 tablet by mouth daily.    Marland Kitchen  Omega-3 Fatty Acids (FISH OIL PO) Take 2 capsules by mouth daily.    Marland Kitchen omeprazole (PRILOSEC) 40 MG capsule Take 40 mg by mouth daily.  3  . triamcinolone (NASACORT ALLERGY 24HR) 55 MCG/ACT AERO nasal inhaler Place 1 spray into both nostrils daily.    . vitamin C (ASCORBIC ACID) 500 MG tablet Take 500 mg by mouth daily.     No current facility-administered medications for this visit.    Allergies  Allergen Reactions  . Ambien [Zolpidem Tartrate] Other (See Comments)    Per patient and wife "hallucinations and sleep walking" (tried to climb out of the windows in the middle of the night)  . Ciprofloxacin Rash  . Diamox [Acetazolamide] Other (See Comments)    unknown  . Doxycycline Rash  . Erythromycin Rash  .  Neptazane [Methazolamide] Other (See Comments)    unknown  . Penicillins Swelling and Rash    Social History   Social History  . Marital Status: Married    Spouse Name: N/A  . Number of Children: 1  . Years of Education: N/A   Occupational History  . retired-Magistrate    Social History Main Topics  . Smoking status: Never Smoker   . Smokeless tobacco: Never Used  . Alcohol Use: Yes     Comment: occassionally  . Drug Use: No  . Sexual Activity: Not on file   Other Topics Concern  . Not on file   Social History Narrative    Family History  Problem Relation Age of Onset  . Other Father 72     of a PE after surgery  . Other       there is no known coronary artery disease   . Cancer - Other Mother     Review of Systems:  As stated in the HPI and otherwise negative.   BP 112/76 mmHg  Pulse 77  Wt 184 lb (83.462 kg)  SpO2 98%  Physical Examination: General: Well developed, well nourished, NAD HEENT: OP clear, mucus membranes moist SKIN: warm, dry. No rashes. Neuro: No focal deficits Musculoskeletal: Muscle strength 5/5 all ext Psychiatric: Mood and affect normal Neck: No JVD, no carotid bruits, no thyromegaly, no lymphadenopathy. Lungs:Clear bilaterally, no wheezes, rhonci, crackles Cardiovascular: Regular rate and rhythm. Loud systolic murmur. No gallops or rubs. Abdomen:Soft. Bowel sounds present. Non-tender.  Extremities: Trace bilateral lower extremity edema. Pulses are 2 + in the bilateral DP/PT.  Echo 12/20/14: Left ventricle: The cavity size was mildly dilated. Wall thickness was increased in a pattern of mild LVH. Systolic function was normal. The estimated ejection fraction was in the range of 55% to 60%. Doppler parameters are consistent with elevated ventricular end-diastolic filling pressure. - Aortic valve: There was severe stenosis. Valve area (VTI): 0.52 cm^2. Valve area (Vmax): 0.45 cm^2. Valve area (Vmean): 0.47 cm^2. - Mitral  valve: Severe posterior annular calcification with at least moderate MR. Possible flail segment to posterior leaflet Suggest TEE to evaluate MR in light of likely need for surgery of Aortic Valve. Calcified annulus. There was moderate regurgitation. - Left atrium: The atrium was severely dilated. - Atrial septum: No defect or patent foramen ovale was identified.  EKG:  EKG is ordered today. The ekg ordered today demonstrates NSR, rate 77 bpm. LVH  Recent Labs: 11/13/2014: ALT 14* 01/08/2015: Hemoglobin 11.2*; Platelets 152 01/17/2015: BUN 16; Creat 0.74; Potassium 4.5; Sodium 135   Lipid Panel No results found for: CHOL, TRIG, HDL, CHOLHDL, VLDL, LDLCALC, LDLDIRECT   Wt Readings  from Last 3 Encounters:  03/20/15 184 lb (83.462 kg)  03/15/15 182 lb 3 oz (82.64 kg)  01/17/15 196 lb (88.905 kg)     Other studies Reviewed: Additional studies/ records that were reviewed today include: I have personally reviewed his echo images today. Review of the above records demonstrates:    Assessment and Plan:   1. Severe aortic valve stenosis: He has severe aortic valve stenosis. I have personally reviewed his echo images and the aortic valve leaflets are thickened with poor mobility. He has recent LE edema felt to be consistent with diastolic CHF. His LV systolic function is normal. Energy level is decreased but he has no exertional chest pain or dyspnea. I have discussed indication for AVR. Given advanced age, I do not think he would be a good candidate for conventional surgical approach to AVR. I have discussed the TAVR procedure today. I think he would be a candidate for TAVR. He would like to proceed with planning. I will arrange a right and left heart cath at [facility] April 19, 2015. He wishes to wait until after Christmas. Will then refer to our CT surgeons on the TAVR team following the cath and then plan CT scans.   2. Mitral regurgitation: Will discuss his mitral disease with our surgery  team. Hopefully we can continue to follow this but may need to consider TEE to better define his mitral valve.   Current medicines are reviewed at length with the patient today.  The patient does not have concerns regarding medicines.  The following changes have been made:  no change  Labs/ tests ordered today include:   Orders Placed This Encounter  Procedures  . Basic Metabolic Panel (BMET)  . CBC w/Diff  . INR/PT    Disposition:   FU will be planned after his cardiac cath.    Signed, Lauree Chandler, MD 03/20/2015 1:50 PM    [facility] [address], [place], [place]  [address] Phone: [phone]; Fax: [phone]

## 2015-03-20 NOTE — Patient Instructions (Addendum)
Medication Instructions:  Your physician recommends that you continue on your current medications as directed. Please refer to the Current Medication list given to you today.   Labwork: Your physician recommends that you return for lab work on January 3,2017.  The lab opens at 7:30 AM   Testing/Procedures: Your physician has requested that you have a cardiac catheterization. Cardiac catheterization is used to diagnose and/or treat various heart conditions. Doctors may recommend this procedure for a number of different reasons. The most common reason is to evaluate chest pain. Chest pain can be a symptom of coronary artery disease (CAD), and cardiac catheterization can show whether plaque is narrowing or blocking your heart's arteries. This procedure is also used to evaluate the valves, as well as measure the blood flow and oxygen levels in different parts of your heart. For further information please visit [email or web address]. Please follow instruction sheet, as given. Scheduled for January 6,2017    Follow-Up: To be determined after catheterization.   Any Other Special Instructions Will Be Listed Below (If Applicable).     If you need a refill on your cardiac medications before your next appointment, please call your pharmacy.

## 2015-04-03 ENCOUNTER — Telehealth: Payer: Self-pay | Admitting: Cardiology

## 2015-04-03 NOTE — Telephone Encounter (Signed)
Returned patient's call. He states his PCP had suggested him going on Xarelto due to upcoming 2017 tier change for Pradaxa. Pt notes his copay will go from $80 to $128, but he is fine with this. He would like to stay on the Pradaxa bc he feels it is working well for him.  I advised no need to change. We also discussed the fact that he has an upcoming TAVR procedure and he felt especially uncomfortable w idea of changing meds prior to invasive surgery. I acknowledged and made sure pt aware that if he does wish to change to other NOAC in the future, he may call and Erasmo Downer can work w/ him on this.  Pt voiced thanks and understanding.

## 2015-04-03 NOTE — Telephone Encounter (Signed)
Mr.Reetz is calling because he is wanting to speak to someone about the changing of his medication Pradaxa. His primary care doctor wants him to change to Xarelto . Please call   Thanks

## 2015-04-16 ENCOUNTER — Other Ambulatory Visit (INDEPENDENT_AMBULATORY_CARE_PROVIDER_SITE_OTHER): Payer: Medicare Other | Admitting: *Deleted

## 2015-04-16 DIAGNOSIS — I350 Nonrheumatic aortic (valve) stenosis: Secondary | ICD-10-CM

## 2015-04-16 LAB — CBC WITH DIFFERENTIAL/PLATELET
BASOS ABS: 0 10*3/uL (ref 0.0–0.1)
BASOS PCT: 0 % (ref 0–1)
EOS ABS: 0.1 10*3/uL (ref 0.0–0.7)
EOS PCT: 1 % (ref 0–5)
HCT: 35.4 % — ABNORMAL LOW (ref 39.0–52.0)
Hemoglobin: 11.5 g/dL — ABNORMAL LOW (ref 13.0–17.0)
Lymphocytes Relative: 17 % (ref 12–46)
Lymphs Abs: 1.1 10*3/uL (ref 0.7–4.0)
MCH: 29 pg (ref 26.0–34.0)
MCHC: 32.5 g/dL (ref 30.0–36.0)
MCV: 89.2 fL (ref 78.0–100.0)
MONO ABS: 1 10*3/uL (ref 0.1–1.0)
MPV: 11.9 fL (ref 8.6–12.4)
Monocytes Relative: 15 % — ABNORMAL HIGH (ref 3–12)
Neutro Abs: 4.3 10*3/uL (ref 1.7–7.7)
Neutrophils Relative %: 67 % (ref 43–77)
Platelets: 173 10*3/uL (ref 150–400)
RBC: 3.97 MIL/uL — AB (ref 4.22–5.81)
RDW: 14.9 % (ref 11.5–15.5)
WBC: 6.4 10*3/uL (ref 4.0–10.5)

## 2015-04-16 LAB — BASIC METABOLIC PANEL
BUN: 14 mg/dL (ref 7–25)
CALCIUM: 9.1 mg/dL (ref 8.6–10.3)
CO2: 26 mmol/L (ref 20–31)
Chloride: 98 mmol/L (ref 98–110)
Creat: 0.82 mg/dL (ref 0.70–1.11)
GLUCOSE: 94 mg/dL (ref 65–99)
Potassium: 4.4 mmol/L (ref 3.5–5.3)
Sodium: 133 mmol/L — ABNORMAL LOW (ref 135–146)

## 2015-04-16 LAB — PROTIME-INR
INR: 1.63 — ABNORMAL HIGH (ref <1.50)
Prothrombin Time: 19.5 seconds — ABNORMAL HIGH (ref 11.6–15.2)

## 2015-04-19 ENCOUNTER — Encounter (HOSPITAL_COMMUNITY)
Admission: RE | Disposition: A | Discharge status: Home / Self Care | Payer: Self-pay | Source: Ambulatory Visit | Attending: Cardiovascular Disease

## 2015-04-19 ENCOUNTER — Ambulatory Visit (HOSPITAL_COMMUNITY)
Admission: RE | Admit: 2015-04-19 | Discharge: 2015-04-19 | Disposition: A | Discharge status: Home / Self Care | Payer: Medicare Other | Source: Ambulatory Visit | Attending: Cardiovascular Disease | Admitting: Cardiovascular Disease

## 2015-04-19 ENCOUNTER — Encounter (HOSPITAL_COMMUNITY): Payer: Self-pay | Admitting: Cardiovascular Disease

## 2015-04-19 DIAGNOSIS — I480 Paroxysmal atrial fibrillation: Secondary | ICD-10-CM | POA: Diagnosis not present

## 2015-04-19 DIAGNOSIS — I340 Nonrheumatic mitral (valve) insufficiency: Secondary | ICD-10-CM | POA: Diagnosis not present

## 2015-04-19 DIAGNOSIS — I2510 Atherosclerotic heart disease of native coronary artery without angina pectoris: Secondary | ICD-10-CM | POA: Diagnosis not present

## 2015-04-19 DIAGNOSIS — I350 Nonrheumatic aortic (valve) stenosis: Secondary | ICD-10-CM | POA: Insufficient documentation

## 2015-04-19 DIAGNOSIS — Z8546 Personal history of malignant neoplasm of prostate: Secondary | ICD-10-CM | POA: Insufficient documentation

## 2015-04-19 DIAGNOSIS — I10 Essential (primary) hypertension: Secondary | ICD-10-CM | POA: Diagnosis not present

## 2015-04-19 DIAGNOSIS — Z7901 Long term (current) use of anticoagulants: Secondary | ICD-10-CM | POA: Insufficient documentation

## 2015-04-19 DIAGNOSIS — Z8551 Personal history of malignant neoplasm of bladder: Secondary | ICD-10-CM | POA: Insufficient documentation

## 2015-04-19 DIAGNOSIS — Z880 Allergy status to penicillin: Secondary | ICD-10-CM | POA: Insufficient documentation

## 2015-04-19 DIAGNOSIS — R42 Dizziness and giddiness: Secondary | ICD-10-CM | POA: Diagnosis not present

## 2015-04-19 HISTORY — PX: CARDIAC CATHETERIZATION: SHX172

## 2015-04-19 LAB — POCT I-STAT 3, ART BLOOD GAS (G3+)
BICARBONATE: 24.3 meq/L — AB (ref 20.0–24.0)
O2 Saturation: 98 %
PCO2 ART: 35.4 mmHg (ref 35.0–45.0)
PH ART: 7.445 (ref 7.350–7.450)
TCO2: 25 mmol/L (ref 0–100)
pO2, Arterial: 95 mmHg (ref 80.0–100.0)

## 2015-04-19 LAB — POCT I-STAT 3, VENOUS BLOOD GAS (G3P V)
ACID-BASE EXCESS: 2 mmol/L (ref 0.0–2.0)
Bicarbonate: 25.9 mEq/L — ABNORMAL HIGH (ref 20.0–24.0)
O2 SAT: 70 %
PCO2 VEN: 39.2 mmHg — AB (ref 45.0–50.0)
PH VEN: 7.429 — AB (ref 7.250–7.300)
PO2 VEN: 35 mmHg (ref 30.0–45.0)
TCO2: 27 mmol/L (ref 0–100)

## 2015-04-19 LAB — POCT ACTIVATED CLOTTING TIME: ACTIVATED CLOTTING TIME: 188 s

## 2015-04-19 SURGERY — RIGHT/LEFT HEART CATH AND CORONARY ANGIOGRAPHY
Anesthesia: LOCAL

## 2015-04-19 MED ORDER — SODIUM CHLORIDE 0.9 % IV SOLN: INTRAVENOUS | Status: AC

## 2015-04-19 MED ORDER — SODIUM CHLORIDE 0.9 % IJ SOLN: 3.0000 mL | Freq: Two times a day (BID) | INTRAMUSCULAR | Status: DC

## 2015-04-19 MED ORDER — HEPARIN SODIUM (PORCINE) 1000 UNIT/ML IJ SOLN
INTRAMUSCULAR | Status: DC | PRN
  Administered 2015-04-19: 4000 [IU] via INTRAVENOUS

## 2015-04-19 MED ORDER — FENTANYL CITRATE (PF) 100 MCG/2ML IJ SOLN
INTRAMUSCULAR | Status: DC | PRN
  Administered 2015-04-19: 25 ug via INTRAVENOUS

## 2015-04-19 MED ORDER — VERAPAMIL HCL 2.5 MG/ML IV SOLN
INTRAVENOUS | Status: AC
  Filled 2015-04-19: qty 2

## 2015-04-19 MED ORDER — VERAPAMIL HCL 2.5 MG/ML IV SOLN
INTRAVENOUS | Status: DC | PRN
  Administered 2015-04-19: 10 mL via INTRA_ARTERIAL

## 2015-04-19 MED ORDER — HEPARIN (PORCINE) IN NACL 2-0.9 UNIT/ML-% IJ SOLN
INTRAMUSCULAR | Status: AC
  Filled 2015-04-19: qty 1000

## 2015-04-19 MED ORDER — HEPARIN (PORCINE) IN NACL 2-0.9 UNIT/ML-% IJ SOLN
INTRAMUSCULAR | Status: DC | PRN
  Administered 2015-04-19: 10:00:00

## 2015-04-19 MED ORDER — ASPIRIN 81 MG PO CHEW
CHEWABLE_TABLET | ORAL | Status: AC
  Filled 2015-04-19: qty 1

## 2015-04-19 MED ORDER — SODIUM CHLORIDE 0.9 % IJ SOLN: 3.0000 mL | INTRAMUSCULAR | Status: DC | PRN

## 2015-04-19 MED ORDER — IOHEXOL 350 MG/ML SOLN
INTRAVENOUS | Status: DC | PRN
  Administered 2015-04-19: 70 mL via INTRA_ARTERIAL

## 2015-04-19 MED ORDER — HEPARIN SODIUM (PORCINE) 1000 UNIT/ML IJ SOLN
INTRAMUSCULAR | Status: AC
  Filled 2015-04-19: qty 1

## 2015-04-19 MED ORDER — LIDOCAINE HCL (PF) 1 % IJ SOLN
INTRAMUSCULAR | Status: AC
  Filled 2015-04-19: qty 30

## 2015-04-19 MED ORDER — MIDAZOLAM HCL 2 MG/2ML IJ SOLN
INTRAMUSCULAR | Status: AC
  Filled 2015-04-19: qty 2

## 2015-04-19 MED ORDER — LIDOCAINE HCL (PF) 1 % IJ SOLN
INTRAMUSCULAR | Status: DC | PRN
  Administered 2015-04-19 (×2): 5 mL

## 2015-04-19 MED ORDER — SODIUM CHLORIDE 0.9 % IV SOLN: 250.0000 mL | INTRAVENOUS | Status: DC | PRN

## 2015-04-19 MED ORDER — FENTANYL CITRATE (PF) 100 MCG/2ML IJ SOLN
INTRAMUSCULAR | Status: AC
  Filled 2015-04-19: qty 2

## 2015-04-19 MED ORDER — ASPIRIN 81 MG PO CHEW
81.0000 mg | CHEWABLE_TABLET | ORAL | Status: AC
  Administered 2015-04-19: 81 mg via ORAL

## 2015-04-19 MED ORDER — MIDAZOLAM HCL 2 MG/2ML IJ SOLN
INTRAMUSCULAR | Status: DC | PRN
  Administered 2015-04-19: 1 mg via INTRAVENOUS

## 2015-04-19 SURGICAL SUPPLY — 17 items
CATH BALLN WEDGE 5F 110CM (CATHETERS) ×2 IMPLANT
CATH INFINITI 5 FR JL3.5 (CATHETERS) ×2 IMPLANT
CATH INFINITI 5FR AL1 (CATHETERS) ×2 IMPLANT
CATH INFINITI 5FR ANG PIGTAIL (CATHETERS) ×2 IMPLANT
CATH INFINITI JR4 5F (CATHETERS) ×2 IMPLANT
DEVICE RAD COMP TR BAND LRG (VASCULAR PRODUCTS) ×2 IMPLANT
GLIDESHEATH SLEND SS 6F .021 (SHEATH) ×2 IMPLANT
GUIDEWIRE .025 260CM (WIRE) ×2 IMPLANT
KIT HEART LEFT (KITS) ×2 IMPLANT
KIT HEART RIGHT NAMIC (KITS) ×2 IMPLANT
PACK CARDIAC CATHETERIZATION (CUSTOM PROCEDURE TRAY) ×2 IMPLANT
SHEATH FAST CATH BRACH 5F 5CM (SHEATH) ×2 IMPLANT
TRANSDUCER W/STOPCOCK (MISCELLANEOUS) ×2 IMPLANT
TUBING CIL FLEX 10 FLL-RA (TUBING) ×2 IMPLANT
WIRE EMERALD ST .035X260CM (WIRE) ×2 IMPLANT
WIRE HI TORQ VERSACORE-J 145CM (WIRE) ×2 IMPLANT
WIRE SAFE-T 1.5MM-J .035X260CM (WIRE) ×2 IMPLANT

## 2015-04-19 NOTE — H&P (View-Only) (Signed)
Chief Complaint  Patient presents with  . New Evaluation    TAVR CONSULT      History of Present Illness: 82 yo male with history of HTN, atrial fibrillation on Pradaxa, bladder cancer, prostate cancer and valvular heart disease who is here today for evaluation of severe aortic valve stenosis. He is followed in our office by Dr. Percival Spanish. He is very active. He exercises 4-5 days per week at the [facility] He has no chest pain or SOB. He has had recent swelling in both legs. No dizziness, near syncope or syncope. Echo 12/20/14 with mean gradient 47 mmHg, peak gradient 102 mm Hg, AVA 0.47cm2. Normal LVEF. He was also noted to have moderate mitral valve regurgitation. He has had no prior cardiac catheterization. No known CAD. 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 He is here today with his wife and son.   Primary Care Physician: Geoffery Lyons, MD   Past Medical History  Diagnosis Date  . Hypertension   . Cataracts, bilateral   . Valvular heart disease     MV regurge and Ao stenosis  . Paroxysmal atrial fibrillation (HCC)     On Pradaxa.   . Vertigo   . 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 Prostate cancer ([place]) 2004  . Hx of colonic polyps 2006,  2011  . Diverticulosis 2006  . Detached retina Twice    As a result he is lost vision in the left eye.    Past Surgical History  Procedure Laterality Date  . Pleural effusion drainage  2010  . Radioactive seed implant  2004    For treatment of prostate cancer  . Squamous cell carcinoma excision  2002, 2003, 2006.  Marland Kitchen Thoracentesis    . Colonoscopy  2006 and 11/2009    tubular adenomas on both studies. int rrhoids, diverticulosis.   . 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 Focal urothelial atypia in 2006.  . Cataract extraction    . Esophagogastroduodenoscopy N/A 11/15/2014    Procedure: ESOPHAGOGASTRODUODENOSCOPY  (EGD);  Surgeon: Milus Banister, MD;  Location: [facility];  Service: Endoscopy;  Laterality: N/A;  . Colonoscopy N/A 11/15/2014    Procedure: COLONOSCOPY;  Surgeon: Milus Banister, MD;  Location: [facility];  Service: Endoscopy;  Laterality: N/A;  . Tonsillectomy      Current Outpatient Prescriptions  Medication Sig Dispense Refill  . acetaminophen (TYLENOL) 500 MG chewable tablet Chew 1,000 mg by mouth once. As needed    . Calcium Polycarbophil (FIBER) 625 MG TABS Take 1 tablet by mouth daily.    . clindamycin (CLEOCIN-T) 1 % gel Apply 1 application topically 2 (two) times daily.      . dabigatran (PRADAXA) 150 MG CAPS Take 150 mg by mouth every 12 (twelve) hours.      . furosemide (LASIX) 40 MG tablet Take 1 tablet (40 mg total) by mouth every other day. (Patient taking differently: Take 40 mg by mouth as needed. ) 30 tablet 0  . lisinopril (PRINIVIL,ZESTRIL) 40 MG tablet Take 40 mg by mouth daily.    Marland Kitchen loratadine (CLARITIN) 10 MG tablet Take 10 mg by mouth daily.    . metroNIDAZOLE (METROCREAM) 0.75 % cream Apply 1 application topically 2 (two) times daily as needed.     . Multiple Vitamins-Minerals (MULTIVITAMIN WITH MINERALS) tablet Take 1 tablet by mouth daily.    Marland Kitchen  Omega-3 Fatty Acids (FISH OIL PO) Take 2 capsules by mouth daily.    Marland Kitchen omeprazole (PRILOSEC) 40 MG capsule Take 40 mg by mouth daily.  3  . triamcinolone (NASACORT ALLERGY 24HR) 55 MCG/ACT AERO nasal inhaler Place 1 spray into both nostrils daily.    . vitamin C (ASCORBIC ACID) 500 MG tablet Take 500 mg by mouth daily.     No current facility-administered medications for this visit.    Allergies  Allergen Reactions  . Ambien [Zolpidem Tartrate] Other (See Comments)    Per patient and wife "hallucinations and sleep walking" (tried to climb out of the windows in the middle of the night)  . Ciprofloxacin Rash  . Diamox [Acetazolamide] Other (See Comments)    unknown  . Doxycycline Rash  . Erythromycin Rash  .  Neptazane [Methazolamide] Other (See Comments)    unknown  . Penicillins Swelling and Rash    Social History   Social History  . Marital Status: Married    Spouse Name: N/A  . Number of Children: 1  . Years of Education: N/A   Occupational History  . retired-Magistrate    Social History Main Topics  . Smoking status: Never Smoker   . Smokeless tobacco: Never Used  . Alcohol Use: Yes     Comment: occassionally  . Drug Use: No  . Sexual Activity: Not on file   Other Topics Concern  . Not on file   Social History Narrative    Family History  Problem Relation Age of Onset  . Other Father 66     of a PE after surgery  . Other       there is no known coronary artery disease   . Cancer - Other Mother     Review of Systems:  As stated in the HPI and otherwise negative.   BP 112/76 mmHg  Pulse 77  Wt 184 lb (83.462 kg)  SpO2 98%  Physical Examination: General: Well developed, well nourished, NAD HEENT: OP clear, mucus membranes moist SKIN: warm, dry. No rashes. Neuro: No focal deficits Musculoskeletal: Muscle strength 5/5 all ext Psychiatric: Mood and affect normal Neck: No JVD, no carotid bruits, no thyromegaly, no lymphadenopathy. Lungs:Clear bilaterally, no wheezes, rhonci, crackles Cardiovascular: Regular rate and rhythm. Loud systolic murmur. No gallops or rubs. Abdomen:Soft. Bowel sounds present. Non-tender.  Extremities: Trace bilateral lower extremity edema. Pulses are 2 + in the bilateral DP/PT.  Echo 12/20/14: Left ventricle: The cavity size was mildly dilated. Wall thickness was increased in a pattern of mild LVH. Systolic function was normal. The estimated ejection fraction was in the range of 55% to 60%. Doppler parameters are consistent with elevated ventricular end-diastolic filling pressure. - Aortic valve: There was severe stenosis. Valve area (VTI): 0.52 cm^2. Valve area (Vmax): 0.45 cm^2. Valve area (Vmean): 0.47 cm^2. - Mitral  valve: Severe posterior annular calcification with at least moderate MR. Possible flail segment to posterior leaflet Suggest TEE to evaluate MR in light of likely need for surgery of Aortic Valve. Calcified annulus. There was moderate regurgitation. - Left atrium: The atrium was severely dilated. - Atrial septum: No defect or patent foramen ovale was identified.  EKG:  EKG is ordered today. The ekg ordered today demonstrates NSR, rate 77 bpm. LVH  Recent Labs: 11/13/2014: ALT 14* 01/08/2015: Hemoglobin 11.2*; Platelets 152 01/17/2015: BUN 16; Creat 0.74; Potassium 4.5; Sodium 135   Lipid Panel No results found for: CHOL, TRIG, HDL, CHOLHDL, VLDL, LDLCALC, LDLDIRECT   Wt Readings  from Last 3 Encounters:  03/20/15 184 lb (83.462 kg)  03/15/15 182 lb 3 oz (82.64 kg)  01/17/15 196 lb (88.905 kg)     Other studies Reviewed: Additional studies/ records that were reviewed today include: I have personally reviewed his echo images today. Review of the above records demonstrates:    Assessment and Plan:   1. Severe aortic valve stenosis: He has severe aortic valve stenosis. I have personally reviewed his echo images and the aortic valve leaflets are thickened with poor mobility. He has recent LE edema felt to be consistent with diastolic CHF. His LV systolic function is normal. Energy level is decreased but he has no exertional chest pain or dyspnea. I have discussed indication for AVR. Given advanced age, I do not think he would be a good candidate for conventional surgical approach to AVR. I have discussed the TAVR procedure today. I think he would be a candidate for TAVR. He would like to proceed with planning. I will arrange a right and left heart cath at [facility] April 19, 2015. He wishes to wait until after Christmas. Will then refer to our CT surgeons on the TAVR team following the cath and then plan CT scans.   2. Mitral regurgitation: Will discuss his mitral disease with our surgery  team. Hopefully we can continue to follow this but may need to consider TEE to better define his mitral valve.   Current medicines are reviewed at length with the patient today.  The patient does not have concerns regarding medicines.  The following changes have been made:  no change  Labs/ tests ordered today include:   Orders Placed This Encounter  Procedures  . Basic Metabolic Panel (BMET)  . CBC w/Diff  . INR/PT    Disposition:   FU will be planned after his cardiac cath.    Signed, Lauree Chandler, MD 03/20/2015 1:50 PM    [facility] [place], [place], [place]  [address] Phone: [phone]; Fax: [phone]

## 2015-04-19 NOTE — Discharge Instructions (Signed)
°  Resume Pradaxa on 04/20/15 if no bleeding from cath sites.    Radial Site Care Refer to this sheet in the next few weeks. These instructions provide you with information about caring for yourself after your procedure. Your health care provider may also give you more specific instructions. Your treatment has been planned according to current medical practices, but problems sometimes occur. Call your health care provider if you have any problems or questions after your procedure. WHAT TO EXPECT AFTER THE PROCEDURE After your procedure, it is typical to have the following:  Bruising at the radial site that usually fades within 1-2 weeks.  Blood collecting in the tissue (hematoma) that may be painful to the touch. It should usually decrease in size and tenderness within 1-2 weeks. HOME CARE INSTRUCTIONS  Take medicines only as directed by your health care provider.  You may shower 24-48 hours after the procedure or as directed by your health care provider. Remove the bandage (dressing) and gently wash the site with plain soap and water. Pat the area dry with a clean towel. Do not rub the site, because this may cause bleeding.  Do not take baths, swim, or use a hot tub until your health care provider approves.  Check your insertion site every day for redness, swelling, or drainage.  Do not apply powder or lotion to the site.  Do not flex or bend the affected arm for 24 hours or as directed by your health care provider.  Do not push or pull heavy objects with the affected arm for 24 hours or as directed by your health care provider.  Do not lift over 10 lb (4.5 kg) for 5 days after your procedure or as directed by your health care provider.  Ask your health care provider when it is okay to:  Return to work or school.  Resume usual physical activities or sports.  Resume sexual activity.  Do not drive home if you are discharged the same day as the procedure. Have someone else drive  you.  You may drive 24 hours after the procedure unless otherwise instructed by your health care provider.  Do not operate machinery or power tools for 24 hours after the procedure.  If your procedure was done as an outpatient procedure, which means that you went home the same day as your procedure, a responsible adult should be with you for the first 24 hours after you arrive home.  Keep all follow-up visits as directed by your health care provider. This is important. SEEK MEDICAL CARE IF:  You have a fever.  You have chills.  You have increased bleeding from the radial site. Hold pressure on the site. SEEK IMMEDIATE MEDICAL CARE IF:  You have unusual pain at the radial site.  You have redness, warmth, or swelling at the radial site.  You have drainage (other than a small amount of blood on the dressing) from the radial site.  The radial site is bleeding, and the bleeding does not stop after 30 minutes of holding steady pressure on the site.  Your arm or hand becomes pale, cool, tingly, or numb.   This information is not intended to replace advice given to you by your health care provider. Make sure you discuss any questions you have with your health care provider.   Document Released: 05/02/2010 Document Revised: 04/20/2014 Document Reviewed: 10/16/2013 Elsevier Interactive Patient Education [employer].

## 2015-04-19 NOTE — Interval H&P Note (Signed)
History and Physical Interval Note:  04/19/2015 9:05 AM  Aaron Lucas  has presented today for cardiac cath with the diagnosis of aortic stenosis  The various methods of treatment have been discussed with the patient and family. After consideration of risks, benefits and other options for treatment, the patient has consented to  Procedure(s): Right/Left Heart Cath and Coronary Angiography (N/A) as a surgical intervention .  The patient's history has been reviewed, patient examined, no change in status, stable for surgery.  I have reviewed the patient's chart and labs.  Questions were answered to the patient's satisfaction.    Cath Lab Visit (complete for each Cath Lab visit)  Clinical Evaluation Leading to the Procedure:   ACS: No.  Non-ACS:    Anginal Classification: CCS I  Anti-ischemic medical therapy: No Therapy  Non-Invasive Test Results: No non-invasive testing performed  Prior CABG: No previous CABG         [name]

## 2015-04-24 ENCOUNTER — Encounter: Payer: Medicare Other | Admitting: Surgery

## 2015-04-24 ENCOUNTER — Encounter: Payer: Self-pay | Admitting: Surgery

## 2015-04-24 ENCOUNTER — Institutional Professional Consult (permissible substitution) (INDEPENDENT_AMBULATORY_CARE_PROVIDER_SITE_OTHER): Payer: Medicare Other | Admitting: Surgery

## 2015-04-24 VITALS — BP 128/77 | HR 71 | Resp 20 | Ht 70.0 in

## 2015-04-24 DIAGNOSIS — I350 Nonrheumatic aortic (valve) stenosis: Secondary | ICD-10-CM | POA: Diagnosis not present

## 2015-04-25 ENCOUNTER — Telehealth: Payer: Self-pay | Admitting: Cardiology

## 2015-04-25 MED ORDER — DABIGATRAN ETEXILATE MESYLATE 150 MG PO CAPS: 150.0000 mg | ORAL_CAPSULE | Freq: Two times a day (BID) | ORAL | Status: DC

## 2015-04-25 NOTE — Telephone Encounter (Signed)
Pt wanted to double check to make sure OK to remain on Pradaxa. Per our last conversation, no indication that he needed to change medication, but he is aware Pradaxa has gone up in Tier on drug coverage.  Pt states still willing to pay extra amt on copay. Advised no need to change in this case - was happy to refill med. Refill sent to pharmacy at pt's request.  Instructed pt to call for any further needs.

## 2015-04-25 NOTE — Telephone Encounter (Signed)
New message ° ° ° ° ° °Returning a nurses call °

## 2015-04-27 ENCOUNTER — Encounter: Payer: Self-pay | Admitting: Surgery

## 2015-04-27 NOTE — Progress Notes (Signed)
Patient ID: Aaron Lucas, male   DOB: [birth year], 82 y.o.   MRN: [id number]  [facility] REPORT  Referring Provider is Burnell Blanks* PCP is Geoffery Lyons, MD  Chief Complaint  Patient presents with  . Aortic Stenosis    Surgical eval for TAVR, Cardiac Cath 04/19/15, ECHO 12/20/14    HPI:  The patient is an 82 year old gentleman with hypertension, atrial fibrillation on Pradaxa, a history of prostate and bladder cancer, and aortic stenosis that has been followed by Dr. Percival Spanish. He has felt well and remains very active going to the [facility] 4-5 days per week using an ellipitical machine for 30 minutes at a time and trying to keep his heart rate in the 90's. He has had no chest pain, shortness of breath or dizziness. He has noticed some recent swelling in his legs, worse on the right where he has varicose veins and this has been easily managed with prn lasix. His last 2D echo on 12/20/2014 showed severely calcified aortic valve leaflets with a mean gradient of 47 mm Hg and a peak of 102 mm Hg. The dimensionless index was 0.13. There was also severe posterior mitral annular calcification with at least moderate MR and a question of a flail segment of the posterior leaflet. The left atrium was severely dilated.  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 He is here with his wife and son today.  Past Medical History  Diagnosis Date  . Hypertension   . Cataracts, bilateral   . Valvular heart disease     MV regurge and Ao stenosis  . Paroxysmal atrial fibrillation (HCC)     On Pradaxa.   . Vertigo   . 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 Prostate cancer ([place]) 2004    seed implant  . Hx of colonic polyps 2006,  2011  . Diverticulosis 2006  . Detached retina Twice    As a result he is lost vision in the left eye.    Past Surgical History    Procedure Laterality Date  . Pleural effusion drainage  2010  . Radioactive seed implant  2004    For treatment of prostate cancer  . Squamous cell carcinoma excision  2002, 2003, 2006.  Marland Kitchen Thoracentesis    . Colonoscopy  2006 and 11/2009    tubular adenomas on both studies. int rrhoids, diverticulosis.   . 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 Focal urothelial atypia in 2006.  . Cataract extraction    . Esophagogastroduodenoscopy N/A 11/15/2014    Procedure: ESOPHAGOGASTRODUODENOSCOPY (EGD);  Surgeon: Milus Banister, MD;  Location: [place];  Service: Endoscopy;  Laterality: N/A;  . Colonoscopy N/A 11/15/2014    Procedure: COLONOSCOPY;  Surgeon: Milus Banister, MD;  Location: [place];  Service: Endoscopy;  Laterality: N/A;  . Tonsillectomy    . Cardiac catheterization N/A 04/19/2015    Procedure: Right/Left Heart Cath and Coronary Angiography;  Surgeon: Burnell Blanks, MD;  Location: [place] [facility];  Service: Cardiovascular;  Laterality: N/A;    Family History  Problem Relation Age of Onset  . Other Father 45     of a PE after surgery  . Other       there is no known coronary artery disease   .  Cancer - Other Mother     Social History   Social History  . Marital Status: Married    Spouse Name: N/A  . Number of Children: 1  . Years of Education: N/A   Occupational History  . retired-Magistrate    Social History Main Topics  . Smoking status: Never Smoker   . Smokeless tobacco: Never Used  . Alcohol Use: Yes     Comment: occassionally  . Drug Use: No  . Sexual Activity: Not on file   Other Topics Concern  . Not on file   Social History Narrative   [employer]   Exercises 5-6 days a week    Blood type O Positive   Lowe sodium diet    Current Outpatient Prescriptions  Medication Sig Dispense Refill  . acetaminophen (TYLENOL) 500 MG tablet Take 500 mg by mouth daily as  needed for mild pain.    . clindamycin (CLEOCIN-T) 1 % gel Apply 1 application topically daily as needed (for skin lesions).     . furosemide (LASIX) 40 MG tablet Take 1 tablet (40 mg total) by mouth every other day. (Patient taking differently: Take 40 mg by mouth as needed for fluid. Every 2 to 3 days) 30 tablet 0  . lisinopril (PRINIVIL,ZESTRIL) 40 MG tablet Take 40 mg by mouth daily.    Marland Kitchen loratadine (CLARITIN) 10 MG tablet Take 10 mg by mouth daily as needed for allergies.     . metroNIDAZOLE (METROCREAM) 0.75 % cream Apply 1 application topically 2 (two) times daily as needed (for skin lesions).     . Multiple Vitamins-Minerals (MULTIVITAMIN WITH MINERALS) tablet Take 1 tablet by mouth daily.    . Omega-3 Fatty Acids (FISH OIL PO) Take 1 capsule by mouth daily.     Marland Kitchen omeprazole (PRILOSEC) 40 MG capsule Take 40 mg by mouth daily.  3  . triamcinolone (NASACORT ALLERGY 24HR) 55 MCG/ACT AERO nasal inhaler Place 1 spray into both nostrils daily as needed (for congestion).     . vitamin C (ASCORBIC ACID) 500 MG tablet Take 500 mg by mouth daily.    . dabigatran (PRADAXA) 150 MG CAPS capsule Take 1 capsule (150 mg total) by mouth every 12 (twelve) hours. 180 capsule 2   No current facility-administered medications for this visit.    Allergies  Allergen Reactions  . Ambien [Zolpidem Tartrate] Other (See Comments)    Per patient and wife "hallucinations and sleep walking" (tried to climb out of the windows in the middle of the night)  . Other Other (See Comments)    Nuts, pecans only--unknown  . Ciprofloxacin Rash  . Diamox [Acetazolamide] Other (See Comments)    unknown  . Doxycycline Rash  . Erythromycin Rash  . Neptazane [Methazolamide] Other (See Comments)    unknown  . Penicillins Swelling and Rash    Has patient had a PCN reaction causing immediate rash, facial/tongue/throat swelling, SOB or lightheadedness with hypotension: unknown Has patient had a PCN reaction causing severe rash  involving mucus membranes or skin necrosis: No Has patient had a PCN reaction that required hospitalization No Has patient had a PCN reaction occurring within the last 10 years: No If all of the above answers are "NO", then may proceed with Cephalosporin use.       Review of Systems:   General:  normal appetite, normal energy, no weight gain, no weight loss, no fever  Cardiac:  no chest pain with exertion, no chest pain at rest, noSOB with no  exertion, no resting SOB, no PND, no orthopnea, no palpitations, no arrhythmia, history of atrial fibrillation and is on Pradaxa, recent mild LE edema,  no dizzy spells,  no syncope  Respiratory:  no shortness of breath, no home oxygen, no productive cough, no dry cough, no bronchitis, no wheezing, no hemoptysis, no asthma, no pain with inspiration or cough, no sleep apnea, no CPAP at night  GI:   no difficulty swallowing, no reflux, no frequent heartburn, no hiatal hernia, no abdominal pain, no constipation, no diarrhea, no hematochezia, no hematemesis, no melena  GU:   no dysuria,  no frequency, no urinary tract infection, no hematuria, prostate cancer treated with radioactive seeds, no kidney stones, no kidney disease  Vascular:  no pain suggestive of claudication, no pain in feet, no leg cramps, right leg varicose veins, no DVT, no non-healing foot ulcer  Neuro:   no stroke, no TIA's, no seizures, no headaches, notemporary blindness one eye,  no slurred speech, no peripheral neuropathy, no chronic pain, no instability of gait, no memory/cognitive dysfunction  Musculoskeletal: no arthritis, no joint swelling, no myalgias, no difficulty walking, no mobility   Skin:   no rash, no itching, no skin infections, no pressure sores or ulcerations  Psych:   no anxiety, no depression, no nervousness, no unusual recent stress  Eyes:   has blurry vision, no floaters, no recent vision changes,  wears glasses and contacts, 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  ENT:   no hearing loss, no loose or painful teeth, no dentures, last saw dentist few months ago  Hematologic:  no easy bruising, no abnormal bleeding, no clotting disorder, no frequent epistaxis  Endocrine:  no diabetes, does not check CBG's at home        Physical Exam:   BP 128/77 mmHg  Pulse 71  Resp 20  Ht 5\' 10"  (1.778 m)  SpO2 96%  General:  Elderly but  well-appearing  HEENT:  Unremarkable , NCAT, pupils asymmetric, EOMI, oropharynx clear, teeth in good condition  Neck:   no JVD, no bruits, no adenopathy or thyromegaly  Chest:   clear to auscultation, symmetrical breath sounds, no wheezes, no rhonchi   CV:   RRR, grade III/VI crescendo/decrescendo murmur heard best at RSB,  no diastolic murmur  Abdomen:  soft, non-tender, no masses or organomegaly  Extremities:  warm, well-perfused, pulses palpable in feet, no LE edema, bilateral varicose veins  Rectal/GU  Deferred  Neuro:   Grossly non-focal and symmetrical throughout  Skin:   Clean and dry, no rashes, no breakdown   Diagnostic Tests:    [facility]            [address], [place] [address]              [phone]  ------------------------------------------------------------------- Transthoracic Echocardiography  Patient:  Josephine, Moschella MR #:    [id number] Study Date: 12/20/2014 Gender:   M Age:    85 Height:   175.3 cm Weight:   92.1 kg BSA:    2.14 m^2 Pt. Status: Room:  ATTENDING  Jenkins Rouge, M.D. ORDERING   Minus Breeding, MD [place], MD PERFORMING  [facility], Outpatient SONOGRAPHER [facility], RDCS  cc:  ------------------------------------------------------------------- LV EF: 55% -  60%  ------------------------------------------------------------------- Indications:   Atrial Fibrillation  (I48.0).  ------------------------------------------------------------------- History:  PMH: History of Pleural Effusion with Drainage, Edema, Transitional  Cell Carcinoma of Bladder, Prostate Cancer (2004) Atrial fibrillation. Coronary artery disease. Aortic valve disease. Risk factors: Hypertension.  ------------------------------------------------------------------- Study Conclusions  - Left ventricle: The cavity size was mildly dilated. Wall thickness was increased in a pattern of mild LVH. Systolic function was normal. The estimated ejection fraction was in the range of 55% to 60%. Doppler parameters are consistent with elevated ventricular end-diastolic filling pressure. - Aortic valve: There was severe stenosis. Valve area (VTI): 0.52 cm^2. Valve area (Vmax): 0.45 cm^2. Valve area (Vmean): 0.47 cm^2. - Mitral valve: Severe posterior annular calcification with at least moderate MR. Possible flail segment to posterior leaflet Suggest TEE to evaluate MR in light of likely need for surgery of Aortic Valve. Calcified annulus. There was moderate regurgitation. - Left atrium: The atrium was severely dilated. - Atrial septum: No defect or patent foramen ovale was identified.  Transthoracic echocardiography. M-mode, complete 2D, spectral Doppler, and color Doppler. Birthdate: Patient birthdate: 07/22/1928. Age: Patient is 82 yr old. Sex: Gender: male. BMI: 30 kg/m^2. Blood pressure:   162/98 Patient status: Outpatient. Study date: Study date: 12/20/2014. Study time: 10:12 AM. Location: Echo laboratory.  -------------------------------------------------------------------  ------------------------------------------------------------------- Left ventricle: The cavity size was mildly dilated. Wall thickness was increased in a pattern of mild LVH. Systolic function was normal. The estimated ejection fraction was in the range of 55% to 60%.  Doppler parameters are consistent with elevated ventricular end-diastolic filling pressure.  ------------------------------------------------------------------- Aortic valve:  Severely calcified leaflets. Doppler:  There was severe stenosis.   VTI ratio of LVOT to aortic valve: 0.15. Valve area (VTI): 0.52 cm^2. Indexed valve area (VTI): 0.24 cm^2/m^2. Peak velocity ratio of LVOT to aortic valve: 0.13. Valve area (Vmax): 0.45 cm^2. Indexed valve area (Vmax): 0.21 cm^2/m^2. Mean velocity ratio of LVOT to aortic valve: 0.14. Valve area (Vmean): 0.47 cm^2. Indexed valve area (Vmean): 0.22 cm^2/m^2. Mean gradient (S): 47 mm Hg. Peak gradient (S): 102 mm Hg.  ------------------------------------------------------------------- Aorta: The aorta was normal, not dilated, and non-diseased.  ------------------------------------------------------------------- Mitral valve: Severe posterior annular calcification with at least moderate MR. Possible flail segment to posterior leaflet Suggest TEE to evaluate MR in light of likely need for surgery of Aortic Valve. Calcified annulus. Doppler: There was moderate regurgitation. Peak gradient (D): 14 mm Hg.  ------------------------------------------------------------------- Left atrium: The atrium was severely dilated.  ------------------------------------------------------------------- Atrial septum: No defect or patent foramen ovale was identified.  ------------------------------------------------------------------- Right ventricle: The cavity size was normal. Wall thickness was normal. Systolic function was normal.  ------------------------------------------------------------------- Pulmonic valve:  Doppler: There was mild regurgitation.  ------------------------------------------------------------------- Tricuspid valve:  Doppler: There was mild  regurgitation.  ------------------------------------------------------------------- Right atrium: The atrium was normal in size.  ------------------------------------------------------------------- Pericardium: The pericardium was normal in appearance.  ------------------------------------------------------------------- Post procedure conclusions Ascending Aorta:  - The aorta was normal, not dilated, and non-diseased.  ------------------------------------------------------------------- Measurements  Left ventricle              Value     Reference LV ID, ED, PLAX chordal      (H)   52.2 mm    43 - 52 LV ID, ES, PLAX chordal          37.9 mm    23 - 38 LV fx shortening, PLAX chordal  (L)   27  %    >=29 LV PW thickness, ED            13.8 mm    --------- IVS/LV PW ratio, ED  0.9      <=1.3 Stroke volume, 2D             49  ml    --------- Stroke volume/bsa, 2D           23  ml/m^2  --------- LV ejection fraction, 1-p A4C       59  %    --------- LV end-diastolic volume, 2-p       137  ml    --------- LV end-systolic volume, 2-p        66  ml    --------- LV ejection fraction, 2-p         52  %    --------- Stroke volume, 2-p            71  ml    --------- LV end-diastolic volume/bsa, 2-p     64  ml/m^2  --------- LV end-systolic volume/bsa, 2-p      31  ml/m^2  --------- Stroke volume/bsa, 2-p          33.2 ml/m^2  --------- LV e&', lateral              8.33 cm/s   --------- LV E/e&', lateral             22.57     --------- LV e&', medial               6.69 cm/s   --------- LV E/e&', medial              28.1      --------- LV e&', average              7.51  cm/s   --------- LV E/e&', average             25.03     ---------  Ventricular septum            Value     Reference IVS thickness, ED             12.4 mm    ---------  LVOT                   Value     Reference LVOT ID, S                21  mm    --------- LVOT area                 3.46 cm^2   --------- LVOT peak velocity, S           65.2 cm/s   --------- LVOT mean velocity, S           42.8 cm/s   --------- LVOT VTI, S                14.1 cm    --------- LVOT peak gradient, S           2   mm Hg  ---------  Aortic valve               Value     Reference Aortic valve peak velocity, S       505  cm/s   --------- Aortic valve mean velocity, S       316  cm/s   --------- Aortic valve VTI, S            93.2 cm    --------- Aortic mean gradient, S  47  mm Hg  --------- Aortic peak gradient, S          102  mm Hg  --------- VTI ratio, LVOT/AV            0.15      --------- Aortic valve area, VTI          0.52 cm^2   --------- Aortic valve area/bsa, VTI        0.24 cm^2/m^2 --------- Velocity ratio, peak, LVOT/AV       0.13      --------- Aortic valve area, peak velocity     0.45 cm^2   --------- Aortic valve area/bsa, peak        0.21 cm^2/m^2 --------- velocity Velocity ratio, mean, LVOT/AV       0.14      --------- Aortic valve area, mean velocity     0.47 cm^2   --------- Aortic valve area/bsa, mean        0.22 cm^2/m^2 --------- velocity  Aorta                   Value     Reference Aortic root ID, ED            31  mm    ---------  Left atrium                 Value     Reference LA ID, A-P, ES              59  mm    --------- LA ID/bsa, A-P          (H)   2.76 cm/m^2  <=2.2 LA volume, S               133  ml    --------- LA volume/bsa, S             62.1 ml/m^2  --------- LA volume, ES, 1-p A4C          115  ml    --------- LA volume/bsa, ES, 1-p A4C        53.7 ml/m^2  --------- LA volume, ES, 1-p A2C          137  ml    --------- LA volume/bsa, ES, 1-p A2C        64  ml/m^2  ---------  Mitral valve               Value     Reference Mitral E-wave peak velocity        188  cm/s   --------- Mitral A-wave peak velocity        83.3 cm/s   --------- Mitral deceleration time         169  ms    150 - 230 Mitral peak gradient, D          14  mm Hg  --------- Mitral E/A ratio, peak          2.26      --------- Mitral maximal regurg velocity,      414  cm/s   --------- PISA Mitral regurg VTI, PISA          121  cm    ---------  Tricuspid valve              Value     Reference Tricuspid regurg peak velocity      353  cm/s   --------- Tricuspid peak RV-RA gradient  50  mm Hg  ---------  Right ventricle              Value     Reference RV s&', lateral, S             10.3 cm/s   ---------  Legend: (L) and (H) mark values outside specified reference range.  ------------------------------------------------------------------- Prepared and Electronically Authenticated by  Jenkins Rouge, M.D. 2016-09-08T12:30:53     Burnell Blanks, MD (Primary)      Procedures    Right/Left Heart Cath and Coronary Angiography    Conclusion    1. Moderate stenosis mid LAD and ostium of the large diagonal branch.   2. Mild stenosis Circumflex and RCA 3. Severe aortic valve stenosis (peak to peak gradient 48 mmHg, mean gradient 41.9 mm Hg, AVA estimated at 1.64 cm2) 4. Elevated PA pressures.   Recommendations: Will proceed with workup for TAVR. I will review his mitral valve disease with Dr. Cyndia Bent. We may need TEE to better define the extent of his mitral valve disease. Will manage his CAD conservatively.     Indications    Severe aortic valve stenosis [I35.0 (ICD-10-CM)]   Coronary artery disease involving native coronary artery of native heart without angina pectoris [I25.10 (ICD-10-CM)]    Technique and Indications    Estimated blood loss <50 mL. Indication: 82 yo male with history of severe aortic valve stenosis. Cath in workup for AVR vs TAVR.   Procedure: The risks, benefits, complications, treatment options, and expected outcomes were discussed with the patient. The patient and/or family concurred with the proposed plan, giving informed consent. The patient was brought to the cath lab after IV hydration was begun and oral premedication was given. The patient was further sedated with Versed and Fentanyl. The right wrist was assessed with a modified Allens test which was positive. There was an IV present in the right antecubital vein. This area was prepped and draped. I exchanged the existing IV catheter for a 5 Fr sheath. A balloon tipped catheter was used to perform a right heart catheterization. The right wrist was prepped and draped in a sterile fashion. 1% lidocaine was used for local anesthesia. Using the modified Seldinger access technique, a 5 French sheath was placed in the right radial artery. 3 mg Verapamil was given through the sheath. 4000 units IV heparin was given. Standard diagnostic catheters were used to perform selective coronary angiography. I crossed the aortic valve with an AL-2 catheter and a straight wire. No LV gram performed. The sheath was removed from the right radial  artery and a Terumo hemostasis band was applied at the arteriotomy site on the right wrist.   No immediate complications.    Coronary Findings    Dominance: Right   Left Anterior Descending  . Vessel is large. Dist LAD filled by collaterals from RPDA.   . Prox LAD to Mid LAD lesion, 40% stenosed. Diffuse.   . Mid LAD to Dist LAD lesion, 60% stenosed. Discrete.   . First Diagonal Branch   The vessel is small in size.   Colon Flattery 1st Diag to 1st Diag lesion, 65% stenosed. Discrete.   . First Septal Branch   The vessel is moderate in size.   Marland Kitchen Second Diagonal Branch   The vessel is large in size.   Colon Flattery 2nd Diag to 2nd Diag lesion, 50% stenosed. Discrete.   . Second Septal Branch   The vessel is moderate in size.  Ramus Intermedius  . Vessel is small.     Left Circumflex   . First Obtuse Marginal Branch   The vessel is large in size.   . 1st Mrg lesion, 20% stenosed. Discrete.   . Lateral First Obtuse Marginal Branch   The vessel is large in size.     Right Coronary Artery  . Vessel is large.   . Prox RCA lesion, 20% stenosed. Discrete.   . Dist RCA lesion, 20% stenosed. Discrete.   . Right Posterior Descending Artery   . Ost RPDA lesion, 40% stenosed. Discrete.       Right Heart Pressures Hemodynamic findings consistent with moderate pulmonary hypertension.    Left Heart    Aortic Valve There is severe aortic valve stenosis. The aortic valve is calcified.    Coronary Diagrams    Diagnostic Diagram            Implants    Name ID Temporary Type Supply   No information to display    PACS Images    Show images for Cardiac catheterization     Link to Procedure Log    Procedure Log      Hemo Data       Most Recent Value   Fick Cardiac Output  10.75 L/min   Fick Cardiac Output Index  5.46 (L/min)/BSA   Aortic Mean Gradient  41.9 mmHg   Aortic Peak Gradient  48 mmHg   Aortic Valve Area  1.64   Aortic Value Area Index  0.83  cm2/BSA   RA A Wave  9 mmHg   RA V Wave  7 mmHg   RA Mean  7 mmHg   RV Systolic Pressure  76 mmHg   RV Diastolic Pressure  1 mmHg   RV EDP  6 mmHg   PA Systolic Pressure  74 mmHg   PA Diastolic Pressure  29 mmHg   PA Mean  48 mmHg   PW A Wave  30 mmHg   PW V Wave  48 mmHg   PW Mean  32 mmHg   AO Systolic Pressure  0000000 mmHg   AO Diastolic Pressure  70 mmHg   AO Mean  93 mmHg   LV Systolic Pressure  [id number] mmHg   LV Diastolic Pressure  8 mmHg   LV EDP  18 mmHg   Arterial Occlusion Pressure Extended Systolic Pressure  0000000 mmHg   Arterial Occlusion Pressure Extended Diastolic Pressure  68 mmHg   Arterial Occlusion Pressure Extended Mean Pressure  93 mmHg   Left Ventricular Apex Extended Systolic Pressure  [id number] mmHg   Left Ventricular Apex Extended Diastolic Pressure  10 mmHg   Left Ventricular Apex Extended EDP Pressure  20 mmHg   QP/QS  1   TPVR Index  8.8 HRUI   TSVR Index  17.04 HRUI   PVR SVR Ratio  0.19   TPVR/TSVR Ratio  0.52      Impression:  I have personally reviewed his echo and cath films. His aortic valve has severely calcified leaflets with severe stenosis and a mean gradient of 47 mm Hg by echo and 42 mm by cath. There is also severe mitral annular calcification with at least moderate MR with a PA pressure of 74/29 with a PW V wave of 48 mm Hg. His only symptom at this time is mild edema in his legs that has been managed with prn lasix. He remains very active which is surprising considering the presence of severe AS  and MR with pulmonary hypertension. I suspect that he will become more symptomatic in the not too distant future so further workup and consideration of treatment of his AS is indicated. I think a TEE should be done to further evaluate the mitral valve and degree of MR before doing any CT scans since that may affect the decision about whether to consider TAVR. I reviewed the cath and echo findings with the patient and his family and  discussed the natural progression of aortic stenosis. They are in agreement with proceeding with further workup as we feel is indicated.   Plan:  I will discuss getting a TEE with Dr. Angelena Form and then will decide on further workup of his severe AS after that.   Gaye Pollack, MD 04/24/2015

## 2015-05-01 ENCOUNTER — Telehealth: Payer: Self-pay | Admitting: Cardiovascular Disease

## 2015-05-01 ENCOUNTER — Encounter: Payer: Self-pay | Admitting: *Deleted

## 2015-05-01 NOTE — Telephone Encounter (Signed)
I spoke to Aaron Lucas this am and discussed TEE to evaluate his Mitral valve better to help [place] plan for whether he needs TAVR alone or open procedure to replace aortic valve and mitral valve. He is in agreement to proceed with TEE. I have reviewed risks and benefits of procedure. He agrees to proceed. Can we arrange within the next week or two with one of the TAVR imagers ([place], [place] or [place])? Thanks, chris

## 2015-05-01 NOTE — Telephone Encounter (Signed)
I spoke with pt and TEE arranged for January 31,2017 with Dr. Aundra Dubin. I verbally went over all instructions with pt and will mail him a copy.

## 2015-05-01 NOTE — Telephone Encounter (Signed)
I placed call to pt to schedule TEE. Left message to call back 

## 2015-05-14 ENCOUNTER — Encounter (HOSPITAL_COMMUNITY)
Admission: RE | Disposition: A | Discharge status: Home / Self Care | Payer: Self-pay | Source: Ambulatory Visit | Attending: Cardiology

## 2015-05-14 ENCOUNTER — Ambulatory Visit (HOSPITAL_BASED_OUTPATIENT_CLINIC_OR_DEPARTMENT_OTHER)
Admission: RE | Admit: 2015-05-14 | Discharge: 2015-05-14 | Disposition: A | Discharge status: Home / Self Care | Payer: Medicare Other | Source: Ambulatory Visit | Attending: Cardiology | Admitting: Cardiology

## 2015-05-14 ENCOUNTER — Encounter (HOSPITAL_COMMUNITY): Payer: Self-pay | Admitting: *Deleted

## 2015-05-14 ENCOUNTER — Ambulatory Visit (HOSPITAL_COMMUNITY)
Admission: RE | Admit: 2015-05-14 | Discharge: 2015-05-14 | Disposition: A | Discharge status: Home / Self Care | Payer: Medicare Other | Source: Ambulatory Visit | Attending: Cardiology | Admitting: Cardiology

## 2015-05-14 DIAGNOSIS — I10 Essential (primary) hypertension: Secondary | ICD-10-CM | POA: Insufficient documentation

## 2015-05-14 DIAGNOSIS — I350 Nonrheumatic aortic (valve) stenosis: Secondary | ICD-10-CM | POA: Diagnosis not present

## 2015-05-14 DIAGNOSIS — I4891 Unspecified atrial fibrillation: Secondary | ICD-10-CM | POA: Diagnosis not present

## 2015-05-14 DIAGNOSIS — Z8546 Personal history of malignant neoplasm of prostate: Secondary | ICD-10-CM | POA: Insufficient documentation

## 2015-05-14 DIAGNOSIS — I340 Nonrheumatic mitral (valve) insufficiency: Secondary | ICD-10-CM | POA: Insufficient documentation

## 2015-05-14 DIAGNOSIS — Z7901 Long term (current) use of anticoagulants: Secondary | ICD-10-CM | POA: Insufficient documentation

## 2015-05-14 DIAGNOSIS — Z8551 Personal history of malignant neoplasm of bladder: Secondary | ICD-10-CM | POA: Diagnosis not present

## 2015-05-14 DIAGNOSIS — I272 Other secondary pulmonary hypertension: Secondary | ICD-10-CM | POA: Insufficient documentation

## 2015-05-14 HISTORY — PX: TEE WITHOUT CARDIOVERSION: SHX5443

## 2015-05-14 SURGERY — ECHOCARDIOGRAM, TRANSESOPHAGEAL
Anesthesia: Moderate Sedation

## 2015-05-14 MED ORDER — FENTANYL CITRATE (PF) 100 MCG/2ML IJ SOLN
INTRAMUSCULAR | Status: DC | PRN
  Administered 2015-05-14 (×2): 12.5 ug via INTRAVENOUS

## 2015-05-14 MED ORDER — SODIUM CHLORIDE 0.9 % IV SOLN: INTRAVENOUS | Status: DC

## 2015-05-14 MED ORDER — FENTANYL CITRATE (PF) 100 MCG/2ML IJ SOLN
INTRAMUSCULAR | Status: AC
  Filled 2015-05-14: qty 2

## 2015-05-14 MED ORDER — BUTAMBEN-TETRACAINE-BENZOCAINE 2-2-14 % EX AERO
INHALATION_SPRAY | CUTANEOUS | Status: DC | PRN
  Administered 2015-05-14: 2 via TOPICAL

## 2015-05-14 MED ORDER — MIDAZOLAM HCL 10 MG/2ML IJ SOLN
INTRAMUSCULAR | Status: DC | PRN
  Administered 2015-05-14 (×2): 1 mg via INTRAVENOUS

## 2015-05-14 MED ORDER — MIDAZOLAM HCL 5 MG/ML IJ SOLN
INTRAMUSCULAR | Status: AC
  Filled 2015-05-14: qty 2

## 2015-05-14 MED ORDER — DIPHENHYDRAMINE HCL 50 MG/ML IJ SOLN
INTRAMUSCULAR | Status: AC
  Filled 2015-05-14: qty 1

## 2015-05-14 NOTE — Discharge Instructions (Signed)
Aortic Stenosis Aortic stenosis is a narrowing of the aortic valve. The aortic valve is a gate-like structure that is located between the lower left chamber of the heart (left ventricle) and the blood vessel that leads away from the heart (aorta). When the aortic valve is narrowed, it does not open all the way. This makes it hard for the heart to pump blood into the aorta and causes the heart to work harder. The extra work can weaken the heart over time and lead to heart failure. CAUSES  Causes of aortic stenosis include:  Calcium deposits on the aortic valve that have made the valve stiff. This condition generally affects those over the age of 68. It is the most common cause of aortic stenosis.  A birth defect.  Rheumatic fever. This is a problem that may occur after a strep throat infection that was not treated adequately. Rheumatic fever can cause permanent damage to heart valves. SIGNS AND SYMPTOMS  People with aortic stenosis usually have no symptoms until the condition becomes severe. It may take 10-20 years for mild or moderate aortic stenosis to become severe. Symptoms may include:   Shortness of breath, especially with physical activity.   Feeling weak and tired (fatigued) or getting tired easily.  Chest discomfort (angina). This may occur with minimal activity if the aortic stenosis is severe.  An irregular or faster-than-normal heartbeat.  Dizziness or fainting that happens with exertion or after taking certain heart medicines (such as nitroglycerin). DIAGNOSIS  Aortic stenosis is usually diagnosed with a physical exam and with a type of imaging test called echocardiography. During echocardiography, sound waves are used to evaluate how blood flows through the heart. If your health care provider suspects aortic stenosis but the test does not clearly show it, a procedure called cardiac catheterization may be done to diagnose the condition. Tests may also be done to evaluate heart  function. They may include:  Electrocardiography. During this test, the electrical impulses of the heart are recorded while you are lying down and sticky patches are placed on your chest, arms, and legs.  Stress tests. There is more than one type of stress test. If a stress test is needed, ask your health care provider about which type is best for you.  Blood tests. TREATMENT  Treatment depends on how severe the aortic stenosis is, your symptoms, and the problems it is causing.   Observation. If the aortic stenosis is mild, no treatment may be needed. However, you will need to have the condition checked regularly to make sure it is not getting worse or causing serious problems.  Surgery. Surgery to repair or replace the aortic valve is the most common treatment for aortic stenosis. Several types of surgeries are available. The most common are open-heart surgery and transcutaneous aortic valve replacement (TAVR). TAVR does not require that the chest be opened. It is usually performed on elderly patients and those who are not able to have open-heart surgery.  Medicines. Medicines may be given to keep symptoms from getting worse. Medicines cannot reverse aortic stenosis. HOME CARE INSTRUCTIONS   You may need to avoid certain types of physical activity. If your aortic stenosis is mild, you may need to avoid only strenuous activity. The more severe your aortic stenosis, the more activities you will need to avoid. Talk with your health care provider about the types of activity you should avoid.  Take medicines only as directed by your health care provider.  If you are a woman with  aortic valve stenosis and want to get pregnant, talk to your health care provider before you become pregnant.  If you are a woman with aortic valve stenosis and are pregnant, keep all follow-up visits with all recommended health care providers.  Keep all follow-up visits for tests, exams, and treatments as directed by  your health care provider. SEEK IMMEDIATE MEDICAL CARE IF:  You develop chest pain or tightness.   You develop shortness of breath or difficulty breathing.   You develop light-headedness or faint.   It feels like your heartbeat is irregular or faster than normal.  You have a fever.   This information is not intended to replace advice given to you by your health care provider. Make sure you discuss any questions you have with your health care provider.   Document Released: 12/27/2002 Document Revised: 12/19/2014 Document Reviewed: 03/24/2012 Elsevier Interactive Patient Education 2016 [place]. Transesophageal Echocardiogram Transesophageal echocardiography (TEE) is a picture test of your heart using sound waves. The pictures taken can give very detailed pictures of your heart. This can help your doctor see if there are problems with your heart. TEE can check:  If your heart has blood clots in it.  How well your heart valves are working.  If you have an infection on the inside of your heart.  Some of the major arteries of your heart.  If your heart valve is working after a [employer].  Your heart before a procedure that uses a shock to your heart to get the rhythm back to normal. BEFORE THE PROCEDURE  Do not eat or drink for 6 hours before the procedure or as told by your doctor.  Make plans to have someone drive you home after the procedure. Do not drive yourself home.  An IV tube will be put in your arm. PROCEDURE  You will be given a medicine to help you relax (sedative). It will be given through the IV tube.  A numbing medicine will be sprayed or gargled in the back of your throat to help numb it.  The tip of the probe is placed into the back of your mouth. You will be asked to swallow. This helps to pass the probe into your esophagus.  Once the tip of the probe is in the right place, your doctor can take pictures of your heart.  You may feel pressure at the  back of your throat. AFTER THE PROCEDURE  You will be taken to a recovery area so the sedative can wear off.  Your throat may be sore and scratchy. This will go away slowly over time.  You will go home when you are fully awake and able to swallow liquids.  You should have someone stay with you for the next 24 hours.  Do not drive or operate machinery for the next 24 hours.   This information is not intended to replace advice given to you by your health care provider. Make sure you discuss any questions you have with your health care provider.   Document Released: 01/25/2009 Document Revised: 04/04/2013 Document Reviewed: 09/29/2012 Elsevier Interactive Patient Education [employer].

## 2015-05-14 NOTE — H&P (View-Only) (Signed)
Patient ID: Aaron Lucas, male   DOB: [birth year], 82 y.o.   MRN: [id number]  [place] [facility]GERY CONSULTATION REPORT  Referring Provider is Burnell Blanks* PCP is Geoffery Lyons, MD  Chief Complaint  Patient presents with  . Aortic Stenosis    Surgical eval for TAVR, Cardiac Cath 04/19/15, ECHO 12/20/14    HPI:  The patient is an 82 year old gentleman with hypertension, atrial fibrillation on Pradaxa, a history of prostate and bladder cancer, and aortic stenosis that has been followed by Dr. Percival Spanish. He has felt well and remains very active going to the [facility] 4-5 days per week using an ellipitical machine for 30 minutes at a time and trying to keep his heart rate in the 90's. He has had no chest pain, shortness of breath or dizziness. He has noticed some recent swelling in his legs, worse on the right where he has varicose veins and this has been easily managed with prn lasix. His last 2D echo on 12/20/2014 showed severely calcified aortic valve leaflets with a mean gradient of 47 mm Hg and a peak of 102 mm Hg. The dimensionless index was 0.13. There was also severe posterior mitral annular calcification with at least moderate MR and a question of a flail segment of the posterior leaflet. The left atrium was severely dilated.  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 He is here with his wife and son today.  Past Medical History  Diagnosis Date  . Hypertension   . Cataracts, bilateral   . Valvular heart disease     MV regurge and Ao stenosis  . Paroxysmal atrial fibrillation (HCC)     On Pradaxa.   . Vertigo   . 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 Prostate cancer ([place]) 2004    seed implant  . Hx of colonic polyps 2006,  2011  . Diverticulosis 2006  . Detached retina Twice    As a result he is lost vision in the left eye.    Past Surgical History    Procedure Laterality Date  . Pleural effusion drainage  2010  . Radioactive seed implant  2004    For treatment of prostate cancer  . Squamous cell carcinoma excision  2002, 2003, 2006.  Marland Kitchen Thoracentesis    . Colonoscopy  2006 and 11/2009    tubular adenomas on both studies. int rrhoids, diverticulosis.   . 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 Focal urothelial atypia in 2006.  . Cataract extraction    . Esophagogastroduodenoscopy N/A 11/15/2014    Procedure: ESOPHAGOGASTRODUODENOSCOPY (EGD);  Surgeon: Milus Banister, MD;  Location: [facility];  Service: Endoscopy;  Laterality: N/A;  . Colonoscopy N/A 11/15/2014    Procedure: COLONOSCOPY;  Surgeon: Milus Banister, MD;  Location: [facility];  Service: Endoscopy;  Laterality: N/A;  . Tonsillectomy    . Cardiac catheterization N/A 04/19/2015    Procedure: Right/Left Heart Cath and Coronary Angiography;  Surgeon: Burnell Blanks, MD;  Location: [facility];  Service: Cardiovascular;  Laterality: N/A;    Family History  Problem Relation Age of Onset  . Other Father 23     of a PE after surgery  . Other       there is no known coronary artery disease   .  Cancer - Other Mother     Social History   Social History  . Marital Status: Married    Spouse Name: N/A  . Number of Children: 1  . Years of Education: N/A   Occupational History  . retired-Magistrate    Social History Main Topics  . Smoking status: Never Smoker   . Smokeless tobacco: Never Used  . Alcohol Use: Yes     Comment: occassionally  . Drug Use: No  . Sexual Activity: Not on file   Other Topics Concern  . Not on file   Social History Narrative   [employer]   Exercises 5-6 days a week    Blood type O Positive   Lowe sodium diet    Current Outpatient Prescriptions  Medication Sig Dispense Refill  . acetaminophen (TYLENOL) 500 MG tablet Take 500 mg by mouth daily as  needed for mild pain.    . clindamycin (CLEOCIN-T) 1 % gel Apply 1 application topically daily as needed (for skin lesions).     . furosemide (LASIX) 40 MG tablet Take 1 tablet (40 mg total) by mouth every other day. (Patient taking differently: Take 40 mg by mouth as needed for fluid. Every 2 to 3 days) 30 tablet 0  . lisinopril (PRINIVIL,ZESTRIL) 40 MG tablet Take 40 mg by mouth daily.    Marland Kitchen loratadine (CLARITIN) 10 MG tablet Take 10 mg by mouth daily as needed for allergies.     . metroNIDAZOLE (METROCREAM) 0.75 % cream Apply 1 application topically 2 (two) times daily as needed (for skin lesions).     . Multiple Vitamins-Minerals (MULTIVITAMIN WITH MINERALS) tablet Take 1 tablet by mouth daily.    . Omega-3 Fatty Acids (FISH OIL PO) Take 1 capsule by mouth daily.     Marland Kitchen omeprazole (PRILOSEC) 40 MG capsule Take 40 mg by mouth daily.  3  . triamcinolone (NASACORT ALLERGY 24HR) 55 MCG/ACT AERO nasal inhaler Place 1 spray into both nostrils daily as needed (for congestion).     . vitamin C (ASCORBIC ACID) 500 MG tablet Take 500 mg by mouth daily.    . dabigatran (PRADAXA) 150 MG CAPS capsule Take 1 capsule (150 mg total) by mouth every 12 (twelve) hours. 180 capsule 2   No current facility-administered medications for this visit.    Allergies  Allergen Reactions  . Ambien [Zolpidem Tartrate] Other (See Comments)    Per patient and wife "hallucinations and sleep walking" (tried to climb out of the windows in the middle of the night)  . Other Other (See Comments)    Nuts, pecans only--unknown  . Ciprofloxacin Rash  . Diamox [Acetazolamide] Other (See Comments)    unknown  . Doxycycline Rash  . Erythromycin Rash  . Neptazane [Methazolamide] Other (See Comments)    unknown  . Penicillins Swelling and Rash    Has patient had a PCN reaction causing immediate rash, facial/tongue/throat swelling, SOB or lightheadedness with hypotension: unknown Has patient had a PCN reaction causing severe rash  involving mucus membranes or skin necrosis: No Has patient had a PCN reaction that required hospitalization No Has patient had a PCN reaction occurring within the last 10 years: No If all of the above answers are "NO", then may proceed with Cephalosporin use.       Review of Systems:   General:  normal appetite, normal energy, no weight gain, no weight loss, no fever  Cardiac:  no chest pain with exertion, no chest pain at rest, noSOB with no  exertion, no resting SOB, no PND, no orthopnea, no palpitations, no arrhythmia, history of atrial fibrillation and is on Pradaxa, recent mild LE edema,  no dizzy spells,  no syncope  Respiratory:  no shortness of breath, no home oxygen, no productive cough, no dry cough, no bronchitis, no wheezing, no hemoptysis, no asthma, no pain with inspiration or cough, no sleep apnea, no CPAP at night  GI:   no difficulty swallowing, no reflux, no frequent heartburn, no hiatal hernia, no abdominal pain, no constipation, no diarrhea, no hematochezia, no hematemesis, no melena  GU:   no dysuria,  no frequency, no urinary tract infection, no hematuria, prostate cancer treated with radioactive seeds, no kidney stones, no kidney disease  Vascular:  no pain suggestive of claudication, no pain in feet, no leg cramps, right leg varicose veins, no DVT, no non-healing foot ulcer  Neuro:   no stroke, no TIA's, no seizures, no headaches, notemporary blindness one eye,  no slurred speech, no peripheral neuropathy, no chronic pain, no instability of gait, no memory/cognitive dysfunction  Musculoskeletal: no arthritis, no joint swelling, no myalgias, no difficulty walking, no mobility   Skin:   no rash, no itching, no skin infections, no pressure sores or ulcerations  Psych:   no anxiety, no depression, no nervousness, no unusual recent stress  Eyes:   has blurry vision, no floaters, no recent vision changes,  wears glasses and contacts, 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  ENT:   no hearing loss, no loose or painful teeth, no dentures, last saw dentist few months ago  Hematologic:  no easy bruising, no abnormal bleeding, no clotting disorder, no frequent epistaxis  Endocrine:  no diabetes, does not check CBG's at home        Physical Exam:   BP 128/77 mmHg  Pulse 71  Resp 20  Ht 5\' 10"  (1.778 m)  SpO2 96%  General:  Elderly but  well-appearing  HEENT:  Unremarkable , NCAT, pupils asymmetric, EOMI, oropharynx clear, teeth in good condition  Neck:   no JVD, no bruits, no adenopathy or thyromegaly  Chest:   clear to auscultation, symmetrical breath sounds, no wheezes, no rhonchi   CV:   RRR, grade III/VI crescendo/decrescendo murmur heard best at RSB,  no diastolic murmur  Abdomen:  soft, non-tender, no masses or organomegaly  Extremities:  warm, well-perfused, pulses palpable in feet, no LE edema, bilateral varicose veins  Rectal/GU  Deferred  Neuro:   Grossly non-focal and symmetrical throughout  Skin:   Clean and dry, no rashes, no breakdown   Diagnostic Tests:    [facility]            [address], [place] [address]              [phone]  ------------------------------------------------------------------- Transthoracic Echocardiography  Patient:  Donyale, Arcari MR #:    [id number] Study Date: 12/20/2014 Gender:   M Age:    49 Height:   175.3 cm Weight:   92.1 kg BSA:    2.14 m^2 Pt. Status: Room:  ATTENDING  Jenkins Rouge, M.D. ORDERING   Minus Breeding, MD [place], MD PERFORMING  [facility], Outpatient SONOGRAPHER [facility], RDCS  cc:  ------------------------------------------------------------------- LV EF: 55% -  60%  ------------------------------------------------------------------- Indications:   Atrial Fibrillation  (I48.0).  ------------------------------------------------------------------- History:  PMH: History of Pleural Effusion with Drainage, Edema, Transitional  Cell Carcinoma of Bladder, Prostate Cancer (2004) Atrial fibrillation. Coronary artery disease. Aortic valve disease. Risk factors: Hypertension.  ------------------------------------------------------------------- Study Conclusions  - Left ventricle: The cavity size was mildly dilated. Wall thickness was increased in a pattern of mild LVH. Systolic function was normal. The estimated ejection fraction was in the range of 55% to 60%. Doppler parameters are consistent with elevated ventricular end-diastolic filling pressure. - Aortic valve: There was severe stenosis. Valve area (VTI): 0.52 cm^2. Valve area (Vmax): 0.45 cm^2. Valve area (Vmean): 0.47 cm^2. - Mitral valve: Severe posterior annular calcification with at least moderate MR. Possible flail segment to posterior leaflet Suggest TEE to evaluate MR in light of likely need for surgery of Aortic Valve. Calcified annulus. There was moderate regurgitation. - Left atrium: The atrium was severely dilated. - Atrial septum: No defect or patent foramen ovale was identified.  Transthoracic echocardiography. M-mode, complete 2D, spectral Doppler, and color Doppler. Birthdate: Patient birthdate: [birth year]. Age: Patient is 82 yr old. Sex: Gender: male. BMI: 30 kg/m^2. Blood pressure:   162/98 Patient status: Outpatient. Study date: Study date: 12/20/2014. Study time: 10:12 AM. Location: Echo laboratory.  -------------------------------------------------------------------  ------------------------------------------------------------------- Left ventricle: The cavity size was mildly dilated. Wall thickness was increased in a pattern of mild LVH. Systolic function was normal. The estimated ejection fraction was in the range of 55% to 60%.  Doppler parameters are consistent with elevated ventricular end-diastolic filling pressure.  ------------------------------------------------------------------- Aortic valve:  Severely calcified leaflets. Doppler:  There was severe stenosis.   VTI ratio of LVOT to aortic valve: 0.15. Valve area (VTI): 0.52 cm^2. Indexed valve area (VTI): 0.24 cm^2/m^2. Peak velocity ratio of LVOT to aortic valve: 0.13. Valve area (Vmax): 0.45 cm^2. Indexed valve area (Vmax): 0.21 cm^2/m^2. Mean velocity ratio of LVOT to aortic valve: 0.14. Valve area (Vmean): 0.47 cm^2. Indexed valve area (Vmean): 0.22 cm^2/m^2. Mean gradient (S): 47 mm Hg. Peak gradient (S): 102 mm Hg.  ------------------------------------------------------------------- Aorta: The aorta was normal, not dilated, and non-diseased.  ------------------------------------------------------------------- Mitral valve: Severe posterior annular calcification with at least moderate MR. Possible flail segment to posterior leaflet Suggest TEE to evaluate MR in light of likely need for surgery of Aortic Valve. Calcified annulus. Doppler: There was moderate regurgitation. Peak gradient (D): 14 mm Hg.  ------------------------------------------------------------------- Left atrium: The atrium was severely dilated.  ------------------------------------------------------------------- Atrial septum: No defect or patent foramen ovale was identified.  ------------------------------------------------------------------- Right ventricle: The cavity size was normal. Wall thickness was normal. Systolic function was normal.  ------------------------------------------------------------------- Pulmonic valve:  Doppler: There was mild regurgitation.  ------------------------------------------------------------------- Tricuspid valve:  Doppler: There was mild  regurgitation.  ------------------------------------------------------------------- Right atrium: The atrium was normal in size.  ------------------------------------------------------------------- Pericardium: The pericardium was normal in appearance.  ------------------------------------------------------------------- Post procedure conclusions Ascending Aorta:  - The aorta was normal, not dilated, and non-diseased.  ------------------------------------------------------------------- Measurements  Left ventricle              Value     Reference LV ID, ED, PLAX chordal      (H)   52.2 mm    43 - 52 LV ID, ES, PLAX chordal          37.9 mm    23 - 38 LV fx shortening, PLAX chordal  (L)   27  %    >=29 LV PW thickness, ED            13.8 mm    --------- IVS/LV PW ratio, ED  0.9      <=1.3 Stroke volume, 2D             49  ml    --------- Stroke volume/bsa, 2D           23  ml/m^2  --------- LV ejection fraction, 1-p A4C       59  %    --------- LV end-diastolic volume, 2-p       137  ml    --------- LV end-systolic volume, 2-p        66  ml    --------- LV ejection fraction, 2-p         52  %    --------- Stroke volume, 2-p            71  ml    --------- LV end-diastolic volume/bsa, 2-p     64  ml/m^2  --------- LV end-systolic volume/bsa, 2-p      31  ml/m^2  --------- Stroke volume/bsa, 2-p          33.2 ml/m^2  --------- LV e&', lateral              8.33 cm/s   --------- LV E/e&', lateral             22.57     --------- LV e&', medial               6.69 cm/s   --------- LV E/e&', medial              28.1      --------- LV e&', average              7.51  cm/s   --------- LV E/e&', average             25.03     ---------  Ventricular septum            Value     Reference IVS thickness, ED             12.4 mm    ---------  LVOT                   Value     Reference LVOT ID, S                21  mm    --------- LVOT area                 3.46 cm^2   --------- LVOT peak velocity, S           65.2 cm/s   --------- LVOT mean velocity, S           42.8 cm/s   --------- LVOT VTI, S                14.1 cm    --------- LVOT peak gradient, S           2   mm Hg  ---------  Aortic valve               Value     Reference Aortic valve peak velocity, S       505  cm/s   --------- Aortic valve mean velocity, S       316  cm/s   --------- Aortic valve VTI, S            93.2 cm    --------- Aortic mean gradient, S  47  mm Hg  --------- Aortic peak gradient, S          102  mm Hg  --------- VTI ratio, LVOT/AV            0.15      --------- Aortic valve area, VTI          0.52 cm^2   --------- Aortic valve area/bsa, VTI        0.24 cm^2/m^2 --------- Velocity ratio, peak, LVOT/AV       0.13      --------- Aortic valve area, peak velocity     0.45 cm^2   --------- Aortic valve area/bsa, peak        0.21 cm^2/m^2 --------- velocity Velocity ratio, mean, LVOT/AV       0.14      --------- Aortic valve area, mean velocity     0.47 cm^2   --------- Aortic valve area/bsa, mean        0.22 cm^2/m^2 --------- velocity  Aorta                   Value     Reference Aortic root ID, ED            31  mm    ---------  Left atrium                 Value     Reference LA ID, A-P, ES              59  mm    --------- LA ID/bsa, A-P          (H)   2.76 cm/m^2  <=2.2 LA volume, S               133  ml    --------- LA volume/bsa, S             62.1 ml/m^2  --------- LA volume, ES, 1-p A4C          115  ml    --------- LA volume/bsa, ES, 1-p A4C        53.7 ml/m^2  --------- LA volume, ES, 1-p A2C          137  ml    --------- LA volume/bsa, ES, 1-p A2C        64  ml/m^2  ---------  Mitral valve               Value     Reference Mitral E-wave peak velocity        188  cm/s   --------- Mitral A-wave peak velocity        83.3 cm/s   --------- Mitral deceleration time         169  ms    150 - 230 Mitral peak gradient, D          14  mm Hg  --------- Mitral E/A ratio, peak          2.26      --------- Mitral maximal regurg velocity,      414  cm/s   --------- PISA Mitral regurg VTI, PISA          121  cm    ---------  Tricuspid valve              Value     Reference Tricuspid regurg peak velocity      353  cm/s   --------- Tricuspid peak RV-RA gradient  50  mm Hg  ---------  Right ventricle              Value     Reference RV s&', lateral, S             10.3 cm/s   ---------  Legend: (L) and (H) mark values outside specified reference range.  ------------------------------------------------------------------- Prepared and Electronically Authenticated by  Jenkins Rouge, M.D. 2016-09-08T12:30:53     Burnell Blanks, MD (Primary)      Procedures    Right/Left Heart Cath and Coronary Angiography    Conclusion    1. Moderate stenosis mid LAD and ostium of the large diagonal branch.   2. Mild stenosis Circumflex and RCA 3. Severe aortic valve stenosis (peak to peak gradient 48 mmHg, mean gradient 41.9 mm Hg, AVA estimated at 1.64 cm2) 4. Elevated PA pressures.   Recommendations: Will proceed with workup for TAVR. I will review his mitral valve disease with Dr. Cyndia Bent. We may need TEE to better define the extent of his mitral valve disease. Will manage his CAD conservatively.     Indications    Severe aortic valve stenosis [I35.0 (ICD-10-CM)]   Coronary artery disease involving native coronary artery of native heart without angina pectoris [I25.10 (ICD-10-CM)]    Technique and Indications    Estimated blood loss <50 mL. Indication: 82 yo male with history of severe aortic valve stenosis. Cath in workup for AVR vs TAVR.   Procedure: The risks, benefits, complications, treatment options, and expected outcomes were discussed with the patient. The patient and/or family concurred with the proposed plan, giving informed consent. The patient was brought to the cath lab after IV hydration was begun and oral premedication was given. The patient was further sedated with Versed and Fentanyl. The right wrist was assessed with a modified Allens test which was positive. There was an IV present in the right antecubital vein. This area was prepped and draped. I exchanged the existing IV catheter for a 5 Fr sheath. A balloon tipped catheter was used to perform a right heart catheterization. The right wrist was prepped and draped in a sterile fashion. 1% lidocaine was used for local anesthesia. Using the modified Seldinger access technique, a 5 French sheath was placed in the right radial artery. 3 mg Verapamil was given through the sheath. 4000 units IV heparin was given. Standard diagnostic catheters were used to perform selective coronary angiography. I crossed the aortic valve with an AL-2 catheter and a straight wire. No LV gram performed. The sheath was removed from the right radial  artery and a Terumo hemostasis band was applied at the arteriotomy site on the right wrist.   No immediate complications.    Coronary Findings    Dominance: Right   Left Anterior Descending  . Vessel is large. Dist LAD filled by collaterals from RPDA.   . Prox LAD to Mid LAD lesion, 40% stenosed. Diffuse.   . Mid LAD to Dist LAD lesion, 60% stenosed. Discrete.   . First Diagonal Branch   The vessel is small in size.   Colon Flattery 1st Diag to 1st Diag lesion, 65% stenosed. Discrete.   . First Septal Branch   The vessel is moderate in size.   Marland Kitchen Second Diagonal Branch   The vessel is large in size.   Colon Flattery 2nd Diag to 2nd Diag lesion, 50% stenosed. Discrete.   . Second Septal Branch   The vessel is moderate in size.  Ramus Intermedius  . Vessel is small.     Left Circumflex   . First Obtuse Marginal Branch   The vessel is large in size.   . 1st Mrg lesion, 20% stenosed. Discrete.   . Lateral First Obtuse Marginal Branch   The vessel is large in size.     Right Coronary Artery  . Vessel is large.   . Prox RCA lesion, 20% stenosed. Discrete.   . Dist RCA lesion, 20% stenosed. Discrete.   . Right Posterior Descending Artery   . Ost RPDA lesion, 40% stenosed. Discrete.       Right Heart Pressures Hemodynamic findings consistent with moderate pulmonary hypertension.    Left Heart    Aortic Valve There is severe aortic valve stenosis. The aortic valve is calcified.    Coronary Diagrams    Diagnostic Diagram            Implants    Name ID Temporary Type Supply   No information to display    PACS Images    Show images for Cardiac catheterization     Link to Procedure Log    Procedure Log      Hemo Data       Most Recent Value   Fick Cardiac Output  10.75 L/min   Fick Cardiac Output Index  5.46 (L/min)/BSA   Aortic Mean Gradient  41.9 mmHg   Aortic Peak Gradient  48 mmHg   Aortic Valve Area  1.64   Aortic Value Area Index  0.83  cm2/BSA   RA A Wave  9 mmHg   RA V Wave  7 mmHg   RA Mean  7 mmHg   RV Systolic Pressure  76 mmHg   RV Diastolic Pressure  1 mmHg   RV EDP  6 mmHg   PA Systolic Pressure  74 mmHg   PA Diastolic Pressure  29 mmHg   PA Mean  48 mmHg   PW A Wave  30 mmHg   PW V Wave  48 mmHg   PW Mean  32 mmHg   AO Systolic Pressure  0000000 mmHg   AO Diastolic Pressure  70 mmHg   AO Mean  93 mmHg   LV Systolic Pressure  [id number] mmHg   LV Diastolic Pressure  8 mmHg   LV EDP  18 mmHg   Arterial Occlusion Pressure Extended Systolic Pressure  0000000 mmHg   Arterial Occlusion Pressure Extended Diastolic Pressure  68 mmHg   Arterial Occlusion Pressure Extended Mean Pressure  93 mmHg   Left Ventricular Apex Extended Systolic Pressure  [id number] mmHg   Left Ventricular Apex Extended Diastolic Pressure  10 mmHg   Left Ventricular Apex Extended EDP Pressure  20 mmHg   QP/QS  1   TPVR Index  8.8 HRUI   TSVR Index  17.04 HRUI   PVR SVR Ratio  0.19   TPVR/TSVR Ratio  0.52      Impression:  I have personally reviewed his echo and cath films. His aortic valve has severely calcified leaflets with severe stenosis and a mean gradient of 47 mm Hg by echo and 42 mm by cath. There is also severe mitral annular calcification with at least moderate MR with a PA pressure of 74/29 with a PW V wave of 48 mm Hg. His only symptom at this time is mild edema in his legs that has been managed with prn lasix. He remains very active which is surprising considering the presence of severe AS  and MR with pulmonary hypertension. I suspect that he will become more symptomatic in the not too distant future so further workup and consideration of treatment of his AS is indicated. I think a TEE should be done to further evaluate the mitral valve and degree of MR before doing any CT scans since that may affect the decision about whether to consider TAVR. I reviewed the cath and echo findings with the patient and his family and  discussed the natural progression of aortic stenosis. They are in agreement with proceeding with further workup as we feel is indicated.   Plan:  I will discuss getting a TEE with Dr. Angelena Form and then will decide on further workup of his severe AS after that.   Gaye Pollack, MD 04/24/2015

## 2015-05-14 NOTE — CV Procedure (Signed)
Procedure: TEE  Indication: Aortic stenosis, mitral regurgitation.   Sedation: Versed 2 mg IV, Fentanyl 25 mcg IV  Findings: Please see echo section for full report.  Mildly dilated LV with normal wall thickness.  EF 55%, normal wall motion.  The RV was normal in size and systolic function.  Trivial TR, trivial PI.  The aortic valve was severely calcified with severe stenosis, mean gradient 42 mmHg/AVA 0.5 cm^2.  The mitral valve annulus was moderately calcified with mild to moderate valvular calcification.  There was a flail P2 segment of the posterior mitral leaflet with severe mitral regurgitation.  Unable to measure PISA due to eccentricity of jet (anteriorly-directed).  Flattened but not reversed pulmonary vein systolic doppler signal.  Mild to moderate left atrial enlargement, no LA appendage thrombus.  Normal right atrial size.  No ASD or PFO by color doppler.  Grade III atherosclerotic plaque in descending thoracic aorta, normal caliber thoracic aorta.   Impression:  1. Severe aortic stenosis. 2. Severe mitral regurgitation due to flail P2 segment of the posterior leaflet.   Loralie Champagne 05/14/2015

## 2015-05-14 NOTE — Interval H&P Note (Signed)
History and Physical Interval Note:  05/14/2015 11:00 AM  Aaron Lucas  has presented today for surgery, with the diagnosis of aortic stenosis mitro valve disease   The various methods of treatment have been discussed with the patient and family. After consideration of risks, benefits and other options for treatment, the patient has consented to  Procedure(s): TRANSESOPHAGEAL ECHOCARDIOGRAM (TEE) (N/A) as a surgical intervention .  The patient's history has been reviewed, patient examined, no change in status, stable for surgery.  I have reviewed the patient's chart and labs.  Questions were answered to the patient's satisfaction.     Porsche Noguchi [employer]

## 2015-05-14 NOTE — Progress Notes (Signed)
Echocardiogram Echocardiogram Transesophageal has been performed.  Goodman, [place]enn[place] 05/14/2015, 11:50 AM

## 2015-05-15 ENCOUNTER — Encounter (HOSPITAL_COMMUNITY): Payer: Self-pay | Admitting: Cardiology

## 2015-05-22 ENCOUNTER — Encounter: Payer: Self-pay | Admitting: Surgery

## 2015-05-22 ENCOUNTER — Ambulatory Visit (INDEPENDENT_AMBULATORY_CARE_PROVIDER_SITE_OTHER): Payer: Medicare Other | Admitting: Surgery

## 2015-05-22 VITALS — BP 100/64 | HR 72 | Resp 16 | Ht 70.0 in | Wt 184.0 lb

## 2015-05-22 DIAGNOSIS — I340 Nonrheumatic mitral (valve) insufficiency: Secondary | ICD-10-CM | POA: Diagnosis not present

## 2015-05-22 DIAGNOSIS — I350 Nonrheumatic aortic (valve) stenosis: Secondary | ICD-10-CM | POA: Diagnosis not present

## 2015-05-22 NOTE — Progress Notes (Signed)
HPI:  Mr. Zinck returns today to discuss the results of his recent TEE. He has severe aortic stenosis by 2D echo and at least moderate MR. His TEE has been reviewed by me and shows a flail P2 segment of the posterior mitral leaflet with severe MR and moderate mitral annular calcification. The mean AV gradient is 42 mm Hg with an AVA of 0.5 cm2. He has a long 40-60% narrowing of the proximal to mid LAD involving a large diagonal branch. He says that he is still going to the [facility] 5 days per week and doing aerobic exercise with no shortness of breath or chest pain. His stamina is good. He denies any dizziness or tiredness. His wife and son have not noted any change in his activity level.   Current Outpatient Prescriptions  Medication Sig Dispense Refill  . acetaminophen (TYLENOL) 500 MG tablet Take 500 mg by mouth daily as needed for mild pain.    . clindamycin (CLEOCIN-T) 1 % gel Apply 1 application topically daily as needed (for skin lesions).     . dabigatran (PRADAXA) 150 MG CAPS capsule Take 1 capsule (150 mg total) by mouth every 12 (twelve) hours. 180 capsule 2  . furosemide (LASIX) 40 MG tablet Take 1 tablet (40 mg total) by mouth every other day. (Patient taking differently: Take 40 mg by mouth as needed for fluid. Every 2 to 3 days) 30 tablet 0  . lisinopril (PRINIVIL,ZESTRIL) 40 MG tablet Take 40 mg by mouth daily.    Marland Kitchen loratadine (CLARITIN) 10 MG tablet Take 10 mg by mouth daily as needed for allergies.     . metroNIDAZOLE (METROCREAM) 0.75 % cream Apply 1 application topically 2 (two) times daily as needed (for skin lesions).     . Misc Natural Products (RED WINE EXTRACT PO) Take 1 tablet by mouth 2 (two) times a week.    . Multiple Vitamins-Minerals (MULTIVITAMIN WITH MINERALS) tablet Take 1 tablet by mouth daily.    . Omega-3 Fatty Acids (FISH OIL PO) Take 1 capsule by mouth daily.     Marland Kitchen omeprazole (PRILOSEC) 40 MG capsule Take 40 mg by mouth daily.  3  . triamcinolone  (NASACORT ALLERGY 24HR) 55 MCG/ACT AERO nasal inhaler Place 1 spray into both nostrils daily as needed (for congestion).     . vitamin C (ASCORBIC ACID) 500 MG tablet Take 500 mg by mouth daily.     No current facility-administered medications for this visit.     Physical Exam: BP 100/64 mmHg  Pulse 72  Resp 16  Ht 5\' 10"  (1.778 m)  Wt 184 lb (83.462 kg)  BMI 26.40 kg/m2  SpO2 98% He looks well Chest:clear to auscultation, symmetrical breath sounds, no wheezes, no rhonchi  CV:RRR, grade III/VI crescendo/decrescendo murmur heard best at RSB, no diastolic murmur  Diagnostic Tests:                [facility]         *[facility]*            [address], [place] [address]              [phone]  ------------------------------------------------------------------- Transesophageal Echocardiography  Patient:  Huckston, Laino MR #:    [id number] Study Date: 05/14/2015 Gender:   M Age:    82 Height:   152.4 cm Weight:   82.7 kg BSA:  1.91 m^2 Pt. Status: Room:  ADMITTING  Loralie Champagne, M.D. ATTENDING  Loralie Champagne, M.D. ORDERING   Loralie Champagne, M.D. PERFORMING  Loralie Champagne, M.D. REFERRING  Loralie Champagne, M.D. SONOGRAPHER Jimmy Reel, RDCS  cc:  ------------------------------------------------------------------- LV EF: 55%  ------------------------------------------------------------------- Indications:   424.1 Aortic valve disorders.  ------------------------------------------------------------------- Study Conclusions  - Left ventricle: The cavity size was mildly dilated. Wall thickness was normal. The estimated ejection fraction was 55%. Wall motion was normal; there were no regional wall motion abnormalities. - Aortic valve: The aortic valve was severely calcified with severe stenosis, mean gradient  42 mmHg/AVA 0.5 cm^2. There was mild regurgitation. - Aorta: Grade III atherosclerotic plaque in descending thoracic aorta, normal caliber thoracic aorta. - Mitral valve: The mitral valve annulus was moderately calcified with mild to moderate valvular calcification. There was a flail P2 segment of the posterior mitral leaflet with severe mitral regurgitation. Unable to measure PISA due to eccentricity of jet (anteriorly-directed). Flattened but not reversed pulmonary vein systolic doppler signal. - Left atrium: The atrium was mildly to moderately dilated. No evidence of thrombus in the atrial cavity or appendage. - Right ventricle: The cavity size was normal. Systolic function was normal. - Right atrium: No evidence of thrombus in the atrial cavity or appendage. - Atrial septum: No defect or patent foramen ovale was identified.  Impressions:  - 1. Severe aortic stenosis. 2. Severe mitral regurgitation due to flail P2 segment of the posterior leaflet.  Diagnostic transesophageal echocardiography. 2D and color Doppler. Birthdate: Patient birthdate: Jan 18, 1929. Age: Patient is 82 yr old. Sex: Gender: male.  BMI: 35.6 kg/m^2. Blood pressure: 112/67 Patient status: Outpatient. Study date: Study date: 05/14/2015. Study time: 11:08 AM. Location: Endoscopy.  -------------------------------------------------------------------  ------------------------------------------------------------------- Left ventricle: The cavity size was mildly dilated. Wall thickness was normal. The estimated ejection fraction was 55%. Wall motion was normal; there were no regional wall motion abnormalities.  ------------------------------------------------------------------- Aortic valve: The aortic valve was severely calcified with severe stenosis, mean gradient 42 mmHg/AVA 0.5 cm^2. Doppler: There was mild regurgitation.  VTI ratio of LVOT to aortic valve:  0.13. Indexed valve area (VTI): 0.24 cm^2/m^2. Peak velocity ratio of LVOT to aortic valve: 0.15. Valve area (Vmax): 0.53 cm^2. Indexed valve area (Vmax): 0.28 cm^2/m^2. Mean velocity ratio of LVOT to aortic valve: 0.14. Indexed valve area (Vmean): 0.26 cm^2/m^2. Mean gradient (S): 42 mm Hg. Peak gradient (S): 72 mm Hg.  ------------------------------------------------------------------- Aorta: Grade III atherosclerotic plaque in descending thoracic aorta, normal caliber thoracic aorta.  ------------------------------------------------------------------- Mitral valve: The mitral valve annulus was moderately calcified with mild to moderate valvular calcification. There was a flail P2 segment of the posterior mitral leaflet with severe mitral regurgitation. Unable to measure PISA due to eccentricity of jet (anteriorly-directed). Flattened but not reversed pulmonary vein systolic doppler signal.  ------------------------------------------------------------------- Left atrium: The atrium was mildly to moderately dilated. No evidence of thrombus in the atrial cavity or appendage.  ------------------------------------------------------------------- Atrial septum: No defect or patent foramen ovale was identified.  ------------------------------------------------------------------- Right ventricle: The cavity size was normal. Systolic function was normal.  ------------------------------------------------------------------- Pulmonic valve:  Doppler: There was trivial regurgitation.  ------------------------------------------------------------------- Tricuspid valve:  Doppler: There was trivial regurgitation.  ------------------------------------------------------------------- Right atrium: The atrium was normal in size. No evidence of thrombus in the atrial cavity or appendage.  ------------------------------------------------------------------- Pericardium: There was  no pericardial effusion.  ------------------------------------------------------------------- Post procedure conclusions Ascending Aorta:  - Grade III atherosclerotic plaque in descending thoracic aorta, normal caliber thoracic aorta.  ------------------------------------------------------------------- Measurements  Left  ventricle              Value     Reference LV ID, ED, PLAX chordal       (H) 56  mm    43 - 52 Stroke volume, 2D            45  ml    --------- Stroke volume/bsa, 2D          24  ml/m^2  ---------  LVOT                   Value     Reference LVOT ID, S                21  mm    --------- LVOT area                3.46 cm^2   --------- LVOT peak velocity, S          64.8 cm/s   --------- LVOT mean velocity, S          43  cm/s   --------- LVOT VTI, S               13.1 cm    --------- LVOT peak gradient, S          2   mm Hg  ---------  Aortic valve               Value     Reference Aortic valve peak velocity, S      425  cm/s   --------- Aortic valve mean velocity, S      303  cm/s   --------- Aortic valve VTI, S           98.7 cm    --------- Aortic mean gradient, S         42  mm Hg  --------- Aortic peak gradient, S         72  mm Hg  --------- VTI ratio, LVOT/AV            0.13      --------- Aortic valve area/bsa, VTI        0.24 cm^2/m^2 --------- Velocity ratio, peak, LVOT/AV      0.15      --------- Aortic valve area, peak velocity     0.53 cm^2   --------- Aortic valve area/bsa, peak velocity   0.28 cm^2/m^2 --------- Velocity ratio, mean, LVOT/AV      0.14      --------- Aortic valve area/bsa, mean velocity   0.26  cm^2/m^2 ---------  Legend: (L) and (H) mark values outside specified reference range.  ------------------------------------------------------------------- Prepared and Electronically Authenticated by  Loralie Champagne, M.D. 2017-01-31T18:46:54  Impression:  He has severe calcific aortic stenosis and severe MR due to a flail segment of the posterior leaflet. He is asymptomatic and physically active at this time. His LVEF is 55-60%. His LV ID during systole and diastole were at the upper end of the normal range on his 2D echo last September. The LV ID ED was slightly larger at 56 mm by recent TEE. I discussed the probability of developing symptoms in the near future given the severity of his valve disease and the possibility that his LV function may deteriorate. He says that he is 67 and feels well and still able to be very active so he is not interested in surgery at this time. He would not be a candidate for TAVR at this  time with severe MR due to a flail leaflet so open surgical AVR and MV repair or replacement ( and CABG to the LAD and diagonal) would be the only option. It would be a higher risk surgery for this 82 year old gentleman with a long, slow recovery. He would like to continue following this for now as long as he feels well. I think he should have a repeat echo within 6 months to follow up on his LV function and dimensions. If he develops any symptoms then he should proceed with surgery to prevent progressive decline in his functioning.  Plan:  He will follow up with Dr. Percival Spanish who can do another echo in 6 months. He will let us know if he develops any symptoms in the interim, which I have reviewed with him and his family.    Gaye Pollack, MD [facility] [phone]

## 2015-07-10 ENCOUNTER — Telehealth: Payer: Self-pay | Admitting: Cardiology

## 2015-07-10 NOTE — Telephone Encounter (Signed)
Received records from [facility] for appointment on 07/12/15 with Dr Percival Spanish.,  Records given to [facility] (medical records) for Dr Hochrein's schedule on 07/12/15. lp

## 2015-07-12 ENCOUNTER — Ambulatory Visit (INDEPENDENT_AMBULATORY_CARE_PROVIDER_SITE_OTHER): Payer: Medicare Other | Admitting: Cardiology

## 2015-07-12 ENCOUNTER — Encounter: Payer: Self-pay | Admitting: Cardiology

## 2015-07-12 VITALS — BP 100/72 | HR 78 | Ht 70.0 in | Wt 176.0 lb

## 2015-07-12 DIAGNOSIS — I350 Nonrheumatic aortic (valve) stenosis: Secondary | ICD-10-CM

## 2015-07-12 NOTE — Progress Notes (Signed)
HPI The patient presents for follow of MR, AS and atrial fibrillation.   He was in the emergency room earlier in the year  with volume overload.  Following a followup appointment I sent him to see if he could be considered for TAVR.   He was thoroughly evaluated by doctors Angelena Form and [place].  Ultimately it was thought that he would not be a good TAVR candidate because of mitral regurgitation. He had a TEE demonstrating his mitral regurgitation 3 severe. His mean gradient across his aortic valves 42.  It was decided to follow this as he was now completely asymptomatic and doing well. He actually has been going to the [facility]. He's lost 15 pounds and is now very much following a salt restricted diet.  He denies any shortness of breath, PND or orthopnea. He only has trace right ankle edema. He's not having any chest pressure, neck or arm discomfort.  Allergies  Allergen Reactions  . Ambien [Zolpidem Tartrate] Other (See Comments)    Per patient and wife "hallucinations and sleep walking" (tried to climb out of the windows in the middle of the night)  . Other Other (See Comments)    Nuts, pecans only--unknown  . Pradaxa [Dabigatran Etexilate Mesylate] Other (See Comments)    Blood in stools  . Ciprofloxacin Rash  . Diamox [Acetazolamide] Other (See Comments)    unknown  . Doxycycline Rash  . Erythromycin Rash  . Neptazane [Methazolamide] Other (See Comments)    unknown  . Penicillins Swelling and Rash    Has patient had a PCN reaction causing immediate rash, facial/tongue/throat swelling, SOB or lightheadedness with hypotension: unknown Has patient had a PCN reaction causing severe rash involving mucus membranes or skin necrosis: No Has patient had a PCN reaction that required hospitalization No Has patient had a PCN reaction occurring within the last 10 years: No If all of the above answers are "NO", then may proceed with Cephalosporin use.     Current Outpatient Prescriptions  Medication  Sig Dispense Refill  . acetaminophen (TYLENOL) 500 MG tablet Take 500 mg by mouth daily as needed for mild pain.    . clindamycin (CLEOCIN-T) 1 % gel Apply 1 application topically daily as needed (for skin lesions).     . dabigatran (PRADAXA) 150 MG CAPS capsule Take 1 capsule (150 mg total) by mouth every 12 (twelve) hours. 180 capsule 2  . furosemide (LASIX) 40 MG tablet Take 1 tablet (40 mg total) by mouth every other day. (Patient taking differently: Take 40 mg by mouth as needed for fluid. Every 2 to 3 days) 30 tablet 0  . lisinopril (PRINIVIL,ZESTRIL) 40 MG tablet Take 40 mg by mouth daily.    Marland Kitchen loratadine (CLARITIN) 10 MG tablet Take 10 mg by mouth daily as needed for allergies.     . metroNIDAZOLE (METROCREAM) 0.75 % cream Apply 1 application topically 2 (two) times daily as needed (for skin lesions).     . Misc Natural Products (RED WINE EXTRACT PO) Take 1 tablet by mouth 2 (two) times a week.    . Multiple Vitamins-Minerals (MULTIVITAMIN WITH MINERALS) tablet Take 1 tablet by mouth daily.    . Omega-3 Fatty Acids (FISH OIL PO) Take 1 capsule by mouth daily.     Marland Kitchen omeprazole (PRILOSEC) 40 MG capsule Take 40 mg by mouth daily.  3  . triamcinolone (NASACORT ALLERGY 24HR) 55 MCG/ACT AERO nasal inhaler Place 1 spray into both nostrils daily as needed (for congestion).     Marland Kitchen  vitamin C (ASCORBIC ACID) 500 MG tablet Take 500 mg by mouth daily.     No current facility-administered medications for this visit.    Past Medical History  Diagnosis Date  . Hypertension   . Cataracts, bilateral   . Valvular heart disease     MV regurge and Ao stenosis  . Paroxysmal atrial fibrillation (HCC)     On Pradaxa.   . Vertigo   . 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 Prostate cancer ([place]) 2004    seed implant  . Hx of colonic polyps 2006,  2011  . Diverticulosis 2006  . Detached retina Twice    As a result he is lost vision in the left eye.    Past Surgical History    Procedure Laterality Date  . Pleural effusion drainage  2010  . Radioactive seed implant  2004    For treatment of prostate cancer  . Squamous cell carcinoma excision  2002, 2003, 2006.  Marland Kitchen Thoracentesis    . Colonoscopy  2006 and 11/2009    tubular adenomas on both studies. int rrhoids, diverticulosis.   . 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 Focal urothelial atypia in 2006.  . Cataract extraction    . Esophagogastroduodenoscopy N/A 11/15/2014    Procedure: ESOPHAGOGASTRODUODENOSCOPY (EGD);  Surgeon: Milus Banister, MD;  Location: [place];  Service: Endoscopy;  Laterality: N/A;  . Colonoscopy N/A 11/15/2014    Procedure: COLONOSCOPY;  Surgeon: Milus Banister, MD;  Location: [place];  Service: Endoscopy;  Laterality: N/A;  . Tonsillectomy    . Cardiac catheterization N/A 04/19/2015    Procedure: Right/Left Heart Cath and Coronary Angiography;  Surgeon: Burnell Blanks, MD;  Location: [place] CV LAB;  Service: Cardiovascular;  Laterality: N/A;  . Tee without cardioversion N/A 05/14/2015    Procedure: TRANSESOPHAGEAL ECHOCARDIOGRAM (TEE);  Surgeon: Larey Dresser, MD;  Location: [facility] ENDOSCOPY;  Service: Cardiovascular;  Laterality: N/A;    ROS:  As stated in the HPI and negative for all other systems.  PHYSICAL EXAM BP 100/72 mmHg  Pulse 78  Ht 5\' 10"  (1.778 m)  Wt 176 lb (79.833 kg)  BMI 25.25 kg/m2 GENERAL:  Well appearing HEENT:  Fundi not visualized, oral mucosa unremarkable, disconjugate gaze with anisocoria.   NECK:  No jugular venous distention, waveform within normal limits, carotid upstroke brisk and symmetric, no bruits, no thyromegaly LUNGS:  Clear to auscultation bilaterally CHEST:  Unremarkable HEART:  PMI not displaced or sustained,S1 and S2 within normal limits, no S3, no S4, no clicks, no rubs, apical holosystolic murmur and murmur radiating out the aortic outflow  tract. ABD:  Flat, positive bowel sounds normal in frequency in pitch, no bruits, no rebound, no guarding, no midline pulsatile mass, no hepatomegaly, no splenomegaly, varicose veins. EXT:  2 plus pulses throughout, mild right greater than left edema, no cyanosis no clubbing    ASSESSMENT AND PLAN  Paroxysmal atrial fibrillation -  He has had no symptomatic paroxysms. He tolerates the Pradaxa and he will continue with this unless he has recurrent GI bleeding.    Mitral regurgitation This is moderately severe regurgitation and we will follow this clinically.   Aortic stenosis -  Given the fact he would not be a TAVR candidate at this point and would be high risk with his age or older chest dual valve surgery and the fact that  he's doing so much better symptomatically with his weight loss and salt restriction I agree with continued medical management. The plan is to review an echocardiogram in 6 months I have ordered this. I reviewed his transesophageal echo and both consultation reports.  Hypertension -  His blood pressure is at target .  He will continue the meds as listed.

## 2015-07-12 NOTE — Patient Instructions (Addendum)
Your physician wants you to follow-up in: 6 Months. You will receive a reminder letter in the mail two months in advance. If you don't receive a letter, please call our office to schedule the follow-up appointment.  Your physician has requested that you have an echocardiogram in 6 months. Echocardiography is a painless test that uses sound waves to create images of your heart. It provides your doctor with information about the size and shape of your heart and how well your heart's chambers and valves are working. This procedure takes approximately one hour. There are no restrictions for this procedure.

## 2015-11-05 ENCOUNTER — Telehealth: Payer: Self-pay | Admitting: Cardiology

## 2015-11-05 NOTE — Telephone Encounter (Signed)
Returned call to patient. Notes fatigue - trouble sleeping, feeling tired during the day.  States onset since Friday or Saturday.  Notes he has been going to [facility] and keeping his regular routine of daily exercise.  He notes no changes to meds, food trends, alcohol, caffeine intake.  Denies palps/rapid HR. 124/75 HR 70s - max mid 90s (w exercise) Denies dizziness, SOB, chest pain, etc.  Pt wanted to sched for 6 mo f/u "a little sooner" - set pt up for appt w Dr. Percival Spanish in Sept.  He notes he's tried [place] in the past and has had similar concerns, but had side effects w/ this med. Advised to follow up w/ PCP to discuss options if sleep issues continue. He is aware to call if new concerns. Pt voiced understanding and thanks.

## 2015-11-05 NOTE — Telephone Encounter (Signed)
New Message  Pt call requesting to speak with RN. Pt c/o being tired all the time. Pt also stated he's having trouble sleeping. Pt would like to speak with nurse to make sure this is not something to be concerned about. Please call back to discuss

## 2015-11-12 NOTE — Progress Notes (Signed)
HPI The patient presents for follow of MR, AS and atrial fibrillation.   He was in the emergency room in January with volume overload.  Following a followup appointment I sent him to see if he could be considered for TAVR.   He was thoroughly evaluated by doctors Angelena Form and [place].  Ultimately it was thought that he would not be a good TAVR candidate because of mitral regurgitation. He had a TEE demonstrating his mitral regurgitation was severe. His mean gradient across his aortic valves was 42.   Since his last visit he has had increased fatigue and called to have his appt time moved up.  He was having decreased sleep. He had a cough with congestion. He was a little more short of breath. However, this seemed to slowly clear up any sputum better this week. He wondered if he could have an allergy. He has had a little more swelling in his ankles. He's not describing any new PND or orthopnea. He's not been having any chest neck or arm discomfort. He's not been able to get back to the [facility]. He has been to fatigue.   Allergies  Allergen Reactions  . Ambien [Zolpidem Tartrate] Other (See Comments)    Per patient and wife "hallucinations and sleep walking" (tried to climb out of the windows in the middle of the night)  . Other Other (See Comments)    Nuts, pecans only--unknown  . Pradaxa [Dabigatran Etexilate Mesylate] Other (See Comments)    Blood in stools  . Ciprofloxacin Rash  . Diamox [Acetazolamide] Other (See Comments)    unknown  . Doxycycline Rash  . Erythromycin Rash  . Neptazane [Methazolamide] Other (See Comments)    unknown  . Penicillins Swelling and Rash    Has patient had a PCN reaction causing immediate rash, facial/tongue/throat swelling, SOB or lightheadedness with hypotension: unknown Has patient had a PCN reaction causing severe rash involving mucus membranes or skin necrosis: No Has patient had a PCN reaction that required hospitalization No Has patient had a PCN reaction  occurring within the last 10 years: No If all of the above answers are "NO", then may proceed with Cephalosporin use.     Current Outpatient Prescriptions  Medication Sig Dispense Refill  . acetaminophen (TYLENOL) 500 MG tablet Take 500 mg by mouth daily as needed for mild pain.    . clindamycin (CLEOCIN-T) 1 % gel Apply 1 application topically daily as needed (for skin lesions).     . dabigatran (PRADAXA) 150 MG CAPS capsule Take 1 capsule (150 mg total) by mouth every 12 (twelve) hours. 180 capsule 3  . furosemide (LASIX) 40 MG tablet Take 1 tablet (40 mg total) by mouth every other day. (Patient taking differently: Take 40 mg by mouth as needed for fluid. Every 2 to 3 days) 30 tablet 0  . lisinopril (PRINIVIL,ZESTRIL) 40 MG tablet Take 40 mg by mouth daily.    Marland Kitchen loratadine (CLARITIN) 10 MG tablet Take 10 mg by mouth daily as needed for allergies.     . metroNIDAZOLE (METROCREAM) 0.75 % cream Apply 1 application topically 2 (two) times daily as needed (for skin lesions).     . Misc Natural Products (RED WINE EXTRACT PO) Take 1 tablet by mouth 2 (two) times a week.    . Multiple Vitamins-Minerals (MULTIVITAMIN WITH MINERALS) tablet Take 1 tablet by mouth daily.    . Omega-3 Fatty Acids (FISH OIL PO) Take 1 capsule by mouth daily.     Marland Kitchen omeprazole (  PRILOSEC) 40 MG capsule Take 40 mg by mouth daily.  3  . triamcinolone (NASACORT ALLERGY 24HR) 55 MCG/ACT AERO nasal inhaler Place 1 spray into both nostrils daily as needed (for congestion).     . vitamin C (ASCORBIC ACID) 500 MG tablet Take 500 mg by mouth daily.     No current facility-administered medications for this visit.     Past Medical History:  Diagnosis Date  . Cataracts, bilateral   . Detached retina Twice   As a result he is lost vision in the left eye.  . Diverticulosis 2006  . Hx of colonic polyps 2006,  2011  . Hypertension   . Paroxysmal atrial fibrillation (HCC)    On Pradaxa.   . Prostate cancer ([place]) 2004   seed  implant  . 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 Valvular heart disease    MV regurge and Ao stenosis  . Vertigo     Past Surgical History:  Procedure Laterality Date  . CARDIAC CATHETERIZATION N/A 04/19/2015   Procedure: Right/Left Heart Cath and Coronary Angiography;  Surgeon: Burnell Blanks, MD;  Location: [place] [facility];  Service: Cardiovascular;  Laterality: N/A;  . CATARACT EXTRACTION    . COLONOSCOPY  2006 and 11/2009   tubular adenomas on both studies. int rrhoids, diverticulosis.   . COLONOSCOPY N/A 11/15/2014   Procedure: COLONOSCOPY;  Surgeon: Milus Banister, MD;  Location: [place];  Service: Endoscopy;  Laterality: N/A;  . 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 Focal urothelial atypia in 2006.  Marland Kitchen ESOPHAGOGASTRODUODENOSCOPY N/A 11/15/2014   Procedure: ESOPHAGOGASTRODUODENOSCOPY (EGD);  Surgeon: Milus Banister, MD;  Location: [place];  Service: Endoscopy;  Laterality: N/A;  . PLEURAL EFFUSION DRAINAGE  2010  . RADIOACTIVE SEED IMPLANT  2004   For treatment of prostate cancer  . SQUAMOUS CELL CARCINOMA EXCISION  2002, 2003, 2006.  . TEE WITHOUT CARDIOVERSION N/A 05/14/2015   Procedure: TRANSESOPHAGEAL ECHOCARDIOGRAM (TEE);  Surgeon: Larey Dresser, MD;  Location: [place];  Service: Cardiovascular;  Laterality: N/A;  . THORACENTESIS    . TONSILLECTOMY      ROS:  As stated in the HPI and negative for all other systems.  PHYSICAL EXAM BP 124/88   Pulse 88   Ht 5\' 10"  (1.778 m)   Wt 185 lb (83.9 kg)   BMI 26.54 kg/m  GENERAL:  Well appearing HEENT:  Fundi not visualized, oral mucosa unremarkable, disconjugate gaze with anisocoria.   NECK:  No jugular venous distention, waveform within normal limits, carotid upstroke brisk and symmetric, no bruits, no thyromegaly LUNGS:  Clear to auscultation bilaterally CHEST:  Unremarkable HEART:  PMI not displaced  or sustained,S1 and S2 within normal limits, no S3, no S4, no clicks, no rubs, apical holosystolic murmur and murmur radiating out the aortic outflow tract. ABD:  Flat, positive bowel sounds normal in frequency in pitch, no bruits, no rebound, no guarding, no midline pulsatile mass, no hepatomegaly, no splenomegaly, varicose veins. EXT:  2 plus pulses throughout, mild right greater than left edema, no cyanosis no clubbing  EKG:  Sinus rhythm, rate 88, axis within normal limits, intervals within normal limits, no acute ST-T wave changes.  11/14/2015   ASSESSMENT AND PLAN  Paroxysmal atrial fibrillation -  He has had no symptomatic paroxysms. He tolerates the Pradaxa and he will continue with this unless he has recurrent GI bleeding.  Mr. Aaron Lucas has a CHA2DS2 - VASc score of 3 with a risk of stroke of 3.2%.  Mitral regurgitation This is moderately severe regurgitation and we will follow this clinically.   Aortic stenosis -  He is not a TAVR candidate and would be too high risk for open valve surgery.  I agree with continued medical management. The plan is to review an echocardiogram in 6 months I will follow up an echo after the next appt.   Hypertension -  His blood pressure is at target .  He will continue the meds as listed.   SOB - This is multifactorial. I am going to check blood work to include a BNP. Check a B medical and CBC. Like to make sure he is not anemic given his anticoagulation use. If he needs to be scheduled take more than his Lasix. He's doing this every day call me and I will check another basic metabolic profile.

## 2015-11-13 ENCOUNTER — Encounter: Payer: Self-pay | Admitting: Cardiology

## 2015-11-14 ENCOUNTER — Encounter: Payer: Self-pay | Admitting: Cardiology

## 2015-11-14 ENCOUNTER — Ambulatory Visit (INDEPENDENT_AMBULATORY_CARE_PROVIDER_SITE_OTHER): Payer: Medicare Other | Admitting: Cardiology

## 2015-11-14 VITALS — BP 124/88 | HR 88 | Ht 70.0 in | Wt 185.0 lb

## 2015-11-14 DIAGNOSIS — I10 Essential (primary) hypertension: Secondary | ICD-10-CM | POA: Diagnosis not present

## 2015-11-14 DIAGNOSIS — Z79899 Other long term (current) drug therapy: Secondary | ICD-10-CM

## 2015-11-14 DIAGNOSIS — I350 Nonrheumatic aortic (valve) stenosis: Secondary | ICD-10-CM | POA: Diagnosis not present

## 2015-11-14 DIAGNOSIS — I480 Paroxysmal atrial fibrillation: Secondary | ICD-10-CM

## 2015-11-14 DIAGNOSIS — R0602 Shortness of breath: Secondary | ICD-10-CM | POA: Diagnosis not present

## 2015-11-14 LAB — CBC
HEMATOCRIT: 39.1 % (ref 38.5–50.0)
HEMOGLOBIN: 12.5 g/dL — AB (ref 13.2–17.1)
MCH: 28 pg (ref 27.0–33.0)
MCHC: 32 g/dL (ref 32.0–36.0)
MCV: 87.5 fL (ref 80.0–100.0)
MPV: 12.2 fL (ref 7.5–12.5)
Platelets: 254 10*3/uL (ref 140–400)
RBC: 4.47 MIL/uL (ref 4.20–5.80)
RDW: 14.9 % (ref 11.0–15.0)
WBC: 11.9 10*3/uL — AB (ref 3.8–10.8)

## 2015-11-14 LAB — BASIC METABOLIC PANEL
BUN: 27 mg/dL — AB (ref 7–25)
CHLORIDE: 97 mmol/L — AB (ref 98–110)
CO2: 23 mmol/L (ref 20–31)
Calcium: 9.5 mg/dL (ref 8.6–10.3)
Creat: 1.22 mg/dL — ABNORMAL HIGH (ref 0.70–1.11)
GLUCOSE: 146 mg/dL — AB (ref 65–99)
POTASSIUM: 4.8 mmol/L (ref 3.5–5.3)
Sodium: 133 mmol/L — ABNORMAL LOW (ref 135–146)

## 2015-11-14 MED ORDER — DABIGATRAN ETEXILATE MESYLATE 150 MG PO CAPS: 150.0000 mg | ORAL_CAPSULE | Freq: Two times a day (BID) | ORAL | 3 refills | Status: AC

## 2015-11-14 NOTE — Patient Instructions (Signed)
Medication Instructions:  Continue current medications  Labwork: BNP, BMP, CBC  Testing/Procedures: NONE  Follow-Up: Your physician recommends that you schedule a follow-up appointment in: 3 Months.   Any Other Special Instructions Will Be Listed Below (If Applicable).   If you need a refill on your cardiac medications before your next appointment, please call your pharmacy.

## 2015-11-15 ENCOUNTER — Telehealth: Payer: Self-pay | Admitting: Cardiology

## 2015-11-15 DIAGNOSIS — Z79899 Other long term (current) drug therapy: Secondary | ICD-10-CM

## 2015-11-15 LAB — BRAIN NATRIURETIC PEPTIDE: Brain Natriuretic Peptide: 1449.9 pg/mL — ABNORMAL HIGH (ref <100)

## 2015-11-15 MED ORDER — FUROSEMIDE 40 MG PO TABS: 40.0000 mg | ORAL_TABLET | Freq: Every day | ORAL | 5 refills | Status: DC

## 2015-11-15 NOTE — Telephone Encounter (Signed)
Returned call to patient. Advised him on lab results, med instructions (take lasix every day) and need for repeat labs in 2 weeks. He voiced understanding. Rx(s) sent to pharmacy electronically.

## 2015-11-15 NOTE — Telephone Encounter (Signed)
New message   Pt verbalized that he is returning call for rn, he believes it is for lab results

## 2015-11-21 ENCOUNTER — Telehealth: Payer: Self-pay | Admitting: General Surgery

## 2015-11-21 NOTE — Telephone Encounter (Signed)
Pt called this evening with worsening Inguinal pain.  He denies nausea and vomiting.  Recommended that he place an ice pack on the area and lay flat to help reduce it.  If he can't get it to reduce, he will go to the ER for evaluation.  He will contact the office in the AM for f/u.  Has a known inguinal hernia diagnosed by Dr Dalbert Batman.

## 2015-11-22 ENCOUNTER — Emergency Department (HOSPITAL_COMMUNITY)
Admission: EM | Admit: 2015-11-22 | Discharge: 2015-11-22 | Disposition: A | Discharge status: Home / Self Care | Payer: Medicare Other | Attending: Emergency Medicine | Admitting: Emergency Medicine

## 2015-11-22 ENCOUNTER — Encounter (HOSPITAL_COMMUNITY): Payer: Self-pay | Admitting: Emergency Medicine

## 2015-11-22 DIAGNOSIS — K4090 Unilateral inguinal hernia, without obstruction or gangrene, not specified as recurrent: Secondary | ICD-10-CM | POA: Diagnosis not present

## 2015-11-22 DIAGNOSIS — I5033 Acute on chronic diastolic (congestive) heart failure: Secondary | ICD-10-CM

## 2015-11-22 DIAGNOSIS — Z7901 Long term (current) use of anticoagulants: Secondary | ICD-10-CM | POA: Insufficient documentation

## 2015-11-22 DIAGNOSIS — R1030 Lower abdominal pain, unspecified: Secondary | ICD-10-CM | POA: Diagnosis present

## 2015-11-22 DIAGNOSIS — E871 Hypo-osmolality and hyponatremia: Secondary | ICD-10-CM | POA: Insufficient documentation

## 2015-11-22 DIAGNOSIS — Z79899 Other long term (current) drug therapy: Secondary | ICD-10-CM | POA: Diagnosis not present

## 2015-11-22 DIAGNOSIS — I480 Paroxysmal atrial fibrillation: Secondary | ICD-10-CM | POA: Diagnosis present

## 2015-11-22 DIAGNOSIS — Z8546 Personal history of malignant neoplasm of prostate: Secondary | ICD-10-CM | POA: Insufficient documentation

## 2015-11-22 DIAGNOSIS — Z01818 Encounter for other preprocedural examination: Secondary | ICD-10-CM

## 2015-11-22 DIAGNOSIS — I10 Essential (primary) hypertension: Secondary | ICD-10-CM | POA: Insufficient documentation

## 2015-11-22 DIAGNOSIS — I350 Nonrheumatic aortic (valve) stenosis: Secondary | ICD-10-CM

## 2015-11-22 HISTORY — DX: Chronic diastolic (congestive) heart failure: I50.32

## 2015-11-22 LAB — CBC WITH DIFFERENTIAL/PLATELET
Basophils Absolute: 0 10*3/uL (ref 0.0–0.1)
Basophils Relative: 0 %
EOS ABS: 0.1 10*3/uL (ref 0.0–0.7)
EOS PCT: 1 %
HCT: 35.8 % — ABNORMAL LOW (ref 39.0–52.0)
Hemoglobin: 11.2 g/dL — ABNORMAL LOW (ref 13.0–17.0)
LYMPHS ABS: 1.1 10*3/uL (ref 0.7–4.0)
LYMPHS PCT: 13 %
MCH: 27.1 pg (ref 26.0–34.0)
MCHC: 31.3 g/dL (ref 30.0–36.0)
MCV: 86.5 fL (ref 78.0–100.0)
MONO ABS: 1 10*3/uL (ref 0.1–1.0)
Monocytes Relative: 11 %
Neutro Abs: 6.6 10*3/uL (ref 1.7–7.7)
Neutrophils Relative %: 75 %
PLATELETS: 209 10*3/uL (ref 150–400)
RBC: 4.14 MIL/uL — ABNORMAL LOW (ref 4.22–5.81)
RDW: 14.9 % (ref 11.5–15.5)
WBC: 8.8 10*3/uL (ref 4.0–10.5)

## 2015-11-22 LAB — URINALYSIS, ROUTINE W REFLEX MICROSCOPIC
BILIRUBIN URINE: NEGATIVE
GLUCOSE, UA: NEGATIVE mg/dL
Ketones, ur: NEGATIVE mg/dL
Nitrite: NEGATIVE
PROTEIN: 30 mg/dL — AB
SPECIFIC GRAVITY, URINE: 1.038 — AB (ref 1.005–1.030)
pH: 5 (ref 5.0–8.0)

## 2015-11-22 LAB — URINE MICROSCOPIC-ADD ON

## 2015-11-22 LAB — COMPREHENSIVE METABOLIC PANEL
ALT: 28 U/L (ref 17–63)
ANION GAP: 12 (ref 5–15)
AST: 26 U/L (ref 15–41)
Albumin: 3.6 g/dL (ref 3.5–5.0)
Alkaline Phosphatase: 75 U/L (ref 38–126)
BUN: 15 mg/dL (ref 6–20)
CHLORIDE: 92 mmol/L — AB (ref 101–111)
CO2: 24 mmol/L (ref 22–32)
Calcium: 9.2 mg/dL (ref 8.9–10.3)
Creatinine, Ser: 0.87 mg/dL (ref 0.61–1.24)
Glucose, Bld: 117 mg/dL — ABNORMAL HIGH (ref 65–99)
POTASSIUM: 4.4 mmol/L (ref 3.5–5.1)
Sodium: 128 mmol/L — ABNORMAL LOW (ref 135–145)
Total Bilirubin: 1.4 mg/dL — ABNORMAL HIGH (ref 0.3–1.2)
Total Protein: 6.4 g/dL — ABNORMAL LOW (ref 6.5–8.1)

## 2015-11-22 NOTE — ED Notes (Signed)
Pt assisted to side of bed

## 2015-11-22 NOTE — ED Notes (Signed)
Pt. Drank water and denies any nausea or vomiting

## 2015-11-22 NOTE — ED Notes (Signed)
Pt. D/C home with family. Denies pain and verbalizes understanding of follow up instructions after speaking with Dr. Ayesha Rumpf.

## 2015-11-22 NOTE — ED Triage Notes (Signed)
Pt. reports intermittent left groin pain for 2 weeks , denies injury or fall , no dysuria or fever , denies pain at triage .

## 2015-11-22 NOTE — ED Provider Notes (Signed)
[facility] DEPT Provider Note   CSN: [id number] Arrival date & time: 11/22/15  [id number]  First Provider Contact:  First MD Initiated Contact with Patient 11/22/15 [id number]        History   Chief Complaint Chief Complaint  Patient presents with  . Groin Pain    HPI MATTHEY FULLARD is a 82 y.o. male.  The history is provided by the patient. No language interpreter was used.  Groin Pain    YUREM SMUTNY is a 82 y.o. male who presents to the Emergency Department complaining of groin pain.  He has a history of a left inguinal hernia followed by Dr. Dalbert Batman with general surgery. He was seen for the hernia in April and had no problems after his appointment and has no future surgery planned. At the last 3 weeks he reports increasing intermittent pain in the left groin with associated firm bulge. Usually the pain and the bulge resolves within about an hour. Last night at 6 PM he developed severe excruciating left groin pain with a firm bulge. He called the surgery office and was told to lay flat and apply ice to his back and massage the hernia. It did not reduce at that time and he presented to the emergency department for evaluation. During his emergency department stay his pain has significantly improved but he has a persistent bulge in the left groin. He denies any fevers, chest pain, shortness of breath, leg swelling, abdominal pain, nausea, vomiting, urinary changes. He has a history of aortic stenosis and takes Pradaxa and saw his cardiologist a week ago. He recently had his Lasix dose doubled.  Past Medical History:  Diagnosis Date  . Cataracts, bilateral   . Detached retina Twice   As a result he is lost vision in the left eye.  . Diverticulosis 2006  . Hx of colonic polyps 2006,  2011  . Hypertension   . Paroxysmal atrial fibrillation (HCC)    On Pradaxa.   . Prostate cancer ([place]) 2004   seed implant  . 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 Valvular heart  disease    MV regurge and Ao stenosis  . Vertigo     Patient Active Problem List   Diagnosis Date Noted  . Severe aortic valve stenosis   . Aortic stenosis 03/15/2015  . Lower GI bleeding 11/13/2014  . Melena 11/13/2014  . Hypertension   . Paroxysmal atrial fibrillation (HCC)   . Mitral regurgitation     Past Surgical History:  Procedure Laterality Date  . CARDIAC CATHETERIZATION N/A 04/19/2015   Procedure: Right/Left Heart Cath and Coronary Angiography;  Surgeon: Burnell Blanks, MD;  Location: [place] [facility];  Service: Cardiovascular;  Laterality: N/A;  . CATARACT EXTRACTION    . COLONOSCOPY  2006 and 11/2009   tubular adenomas on both studies. int rrhoids, diverticulosis.   . COLONOSCOPY N/A 11/15/2014   Procedure: COLONOSCOPY;  Surgeon: Milus Banister, MD;  Location: [place];  Service: Endoscopy;  Laterality: N/A;  . 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 Focal urothelial atypia in 2006.  Marland Kitchen ESOPHAGOGASTRODUODENOSCOPY N/A 11/15/2014   Procedure: ESOPHAGOGASTRODUODENOSCOPY (EGD);  Surgeon: Milus Banister, MD;  Location: [place];  Service: Endoscopy;  Laterality: N/A;  . PLEURAL EFFUSION DRAINAGE  2010  . RADIOACTIVE SEED IMPLANT  2004   For treatment of prostate cancer  . SQUAMOUS CELL CARCINOMA EXCISION  2002, 2003, 2006.  . TEE WITHOUT CARDIOVERSION N/A 05/14/2015   Procedure: TRANSESOPHAGEAL ECHOCARDIOGRAM (TEE);  Surgeon: Larey Dresser, MD;  Location: [facility];  Service: Cardiovascular;  Laterality: N/A;  . THORACENTESIS    . TONSILLECTOMY         Home Medications    Prior to Admission medications   Medication Sig Start Date End Date Taking? Authorizing Provider  acetaminophen (TYLENOL) 500 MG tablet Take 500 mg by mouth daily as needed for mild pain.   Yes Historical Provider, MD  clindamycin (CLEOCIN-T) 1 % gel Apply 1 application topically daily as needed (for skin  lesions).    Yes Historical Provider, MD  dabigatran (PRADAXA) 150 MG CAPS capsule Take 1 capsule (150 mg total) by mouth every 12 (twelve) hours. 11/14/15  Yes Minus Breeding, MD  furosemide (LASIX) 40 MG tablet Take 1 tablet (40 mg total) by mouth daily. 11/15/15  Yes Minus Breeding, MD  lisinopril (PRINIVIL,ZESTRIL) 40 MG tablet Take 40 mg by mouth daily. 11/19/13  Yes Historical Provider, MD  loratadine (CLARITIN) 10 MG tablet Take 10 mg by mouth daily as needed for allergies.    Yes Historical Provider, MD  metroNIDAZOLE (METROCREAM) 0.75 % cream Apply 1 application topically 2 (two) times daily as needed (for skin lesions).    Yes Historical Provider, MD  Misc Natural Products (RED WINE EXTRACT PO) Take 1 tablet by mouth 2 (two) times a week.   Yes Historical Provider, MD  Multiple Vitamins-Minerals (MULTIVITAMIN WITH MINERALS) tablet Take 1 tablet by mouth daily.   Yes Historical Provider, MD  Omega-3 Fatty Acids (FISH OIL PO) Take 1 capsule by mouth daily.    Yes Historical Provider, MD  omeprazole (PRILOSEC) 40 MG capsule Take 40 mg by mouth daily. 12/18/14  Yes Historical Provider, MD  triamcinolone (NASACORT ALLERGY 24HR) 55 MCG/ACT AERO nasal inhaler Place 1 spray into both nostrils daily as needed (for congestion).    Yes Historical Provider, MD  vitamin C (ASCORBIC ACID) 500 MG tablet Take 500 mg by mouth daily.   Yes Historical Provider, MD    Family History Family History  Problem Relation Age of Onset  . Other Father 59     of a PE after surgery  . Cancer - Other Mother   . Other       there is no known coronary artery disease     Social History Social History  Substance Use Topics  . Smoking status: Never Smoker  . Smokeless tobacco: Never Used  . Alcohol use Yes     Comment: occassionally     Allergies   Ambien [zolpidem tartrate]; Other; Ciprofloxacin; Diamox [acetazolamide]; Doxycycline; Erythromycin; Neptazane [methazolamide]; and Penicillins   Review of  Systems Review of Systems  All other systems reviewed and are negative.    Physical Exam Updated Vital Signs BP 133/80   Pulse 89   Temp 97.4 F (36.3 C) (Oral)   Resp 17   Ht 5\' 10"  (1.778 m)   Wt 180 lb (81.6 kg)   SpO2 96%   BMI 25.83 kg/m   Physical Exam  Constitutional: He is oriented to person, place, and time. He appears well-developed and well-nourished.  HENT:  Head: Normocephalic and atraumatic.  Cardiovascular: Normal rate and regular rhythm.   SEM  Pulmonary/Chest: Effort normal and breath sounds normal. No respiratory distress.  Abdominal: Soft. There is no tenderness. There is no rebound and no guarding.  Firm left inguinal hernia that reduces after patient was laid  supine with firm steady pressure for approximately 20 seconds.  Musculoskeletal: He exhibits no edema or tenderness.  Neurological: He is alert and oriented to person, place, and time.  Skin: Skin is warm and dry.  Psychiatric: He has a normal mood and affect. His behavior is normal.  Nursing note and vitals reviewed.    ED Treatments / Results  Labs (all labs ordered are listed, but only abnormal results are displayed) Labs Reviewed  URINALYSIS, ROUTINE W REFLEX MICROSCOPIC (NOT AT [facility]) - Abnormal; Notable for the following:       Result Value   Color, Urine AMBER (*)    APPearance CLOUDY (*)    Specific Gravity, Urine 1.038 (*)    Hgb urine dipstick MODERATE (*)    Protein, ur 30 (*)    Leukocytes, UA SMALL (*)    All other components within normal limits  CBC WITH DIFFERENTIAL/PLATELET - Abnormal; Notable for the following:    RBC 4.14 (*)    Hemoglobin 11.2 (*)    HCT 35.8 (*)    All other components within normal limits  COMPREHENSIVE METABOLIC PANEL - Abnormal; Notable for the following:    Sodium 128 (*)    Chloride 92 (*)    Glucose, Bld 117 (*)    Total Protein 6.4 (*)    Total Bilirubin 1.4 (*)    All other components within normal limits  URINE MICROSCOPIC-ADD ON -  Abnormal; Notable for the following:    Squamous Epithelial / LPF 0-5 (*)    Bacteria, UA FEW (*)    Casts HYALINE CASTS (*)    All other components within normal limits    EKG  EKG Interpretation None       Radiology No results found.  Procedures Procedures (including critical care time)  Medications Ordered in ED Medications - No data to display   Initial Impression / Assessment and Plan / ED Course  I have reviewed the triage vital signs and the nursing notes.  Pertinent labs & imaging results that were available during my care of the patient were reviewed by me and considered in my medical decision making (see chart for details).  Clinical Course    Patient here for evaluation of groin pain. He had a hernia on ED arrival that was reduced and his pain was completely resolved on reassessment. He was evaluated by general surgery in the emergency department with plan to DC home with outpatient surgery follow-up. He also was noted to be mildly hyponatremic compared to labs earlier in the week. Plan to decrease his Lasix as he is euvolemic on examination with close cardiology follow-up for medication adjustment as well as preoperative clearance for hernia repair. Discussed with patient home care for inguinal hernia as well as close return precautions for any evidence of incarceration or worsening symptoms.  Final Clinical Impressions(s) / ED Diagnoses   Final diagnoses:  Unilateral inguinal hernia without obstruction or gangrene, recurrence not specified  Hyponatremia    New Prescriptions Discharge Medication List as of 11/22/2015 10:36 AM       Quintella Reichert, MD 11/22/15 1706

## 2015-11-22 NOTE — Consult Note (Signed)
[facility] Surgery Consult Note  Aaron Lucas [birth year]  [id number].    Requesting MD: Dr. Ralene Bathe Chief Complaint/Reason for Consult: left inguinal hernia HPI:  82 year-old male who presented to [facility] with his wife c/o a painful left inguinal bulge. He has multiple medical problems including, but not limited to, PMH prostate cancer s/p radioactive seed implant, a.fib, and aortic valve stenosis, on Pradaxa. The patient was diagnosed with a left inguinal hernia previously and Dr. Dalbert Batman saw him in the [place] office in April 2017. In the past, the hernia intermittently "pops out" every few weeks but reduces spontaneously and has never been painful. Today he reports intermittent pain for about 3 weeks that acutely worsened last night. He noticed the bulge in his left groin yesterday morning but it became painful around 5-6 PM yesterday. Denies any redness over the area. Attempted to lay flat and apply ice to the area with no relief and presented to ED around 5 AM this morning. Denies pain currently and the EDP was able to reduce the hernia completely. The hernia was not associated with fever, chills, nausea, vomiting, or changes in bowel habits. The patients last BM was yesterday and was formed, soft, and brown. He denies abdominal pain, CP, SOB, and urinary sxs. Due to some leg swelling last week, his Lasix was recently increased. He has an appointment with his cardiologist next week.  ROS: All systems reviewed and otherwise negative except for as above  Family History  Problem Relation Age of Onset  . Other Father 44     of a PE after surgery  . Cancer - Other Mother   . Other       there is no known coronary artery disease     Past Medical History:  Diagnosis Date  . Cataracts, bilateral   . Detached retina Twice   As a result he is lost vision in the left eye.  . Diverticulosis 2006  . Hx of colonic polyps 2006,  2011  . Hypertension   . Paroxysmal atrial fibrillation (HCC)    On  Pradaxa.   . Prostate cancer ([place]) 2004   seed implant  . 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 Valvular heart disease    MV regurge and Ao stenosis  . Vertigo     Past Surgical History:  Procedure Laterality Date  . CARDIAC CATHETERIZATION N/A 04/19/2015   Procedure: Right/Left Heart Cath and Coronary Angiography;  Surgeon: Burnell Blanks, MD;  Location: [place] [facility];  Service: Cardiovascular;  Laterality: N/A;  . CATARACT EXTRACTION    . COLONOSCOPY  2006 and 11/2009   tubular adenomas on both studies. int rrhoids, diverticulosis.   . COLONOSCOPY N/A 11/15/2014   Procedure: COLONOSCOPY;  Surgeon: Milus Banister, MD;  Location: [place];  Service: Endoscopy;  Laterality: N/A;  . 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 Focal urothelial atypia in 2006.  Marland Kitchen ESOPHAGOGASTRODUODENOSCOPY N/A 11/15/2014   Procedure: ESOPHAGOGASTRODUODENOSCOPY (EGD);  Surgeon: Milus Banister, MD;  Location: [place];  Service: Endoscopy;  Laterality: N/A;  . PLEURAL EFFUSION DRAINAGE  2010  . RADIOACTIVE SEED IMPLANT  2004   For treatment of prostate cancer  . SQUAMOUS CELL CARCINOMA EXCISION  2002, 2003, 2006.  . TEE WITHOUT CARDIOVERSION N/A 05/14/2015   Procedure: TRANSESOPHAGEAL ECHOCARDIOGRAM (TEE);  Surgeon: Larey Dresser, MD;  Location: [place];  Service: Cardiovascular;  Laterality: N/A;  . THORACENTESIS    . TONSILLECTOMY      Social History:  reports that he has never smoked. He has never used smokeless tobacco. He reports that he drinks alcohol. He reports that he does not use drugs.  Allergies:  Allergies  Allergen Reactions  . Ambien [Zolpidem Tartrate] Other (See Comments)    Per patient and wife "hallucinations and sleep walking" (tried to climb out of the windows in the middle of the night)  . Other Other (See Comments)    Nuts, pecans only--unknown  . Ciprofloxacin  Rash  . Diamox [Acetazolamide] Other (See Comments)    unknown  . Doxycycline Rash  . Erythromycin Rash  . Neptazane [Methazolamide] Other (See Comments)    unknown  . Penicillins Swelling and Rash    Has patient had a PCN reaction causing immediate rash, facial/tongue/throat swelling, SOB or lightheadedness with hypotension: unknown Has patient had a PCN reaction causing severe rash involving mucus membranes or skin necrosis: No Has patient had a PCN reaction that required hospitalization No Has patient had a PCN reaction occurring within the last 10 years: No If all of the above answers are "NO", then may proceed with Cephalosporin use.      (Not in a hospital admission)  Blood pressure 133/70, pulse 89, temperature 97.8 F (36.6 C), temperature source Oral, resp. rate 25, height '5\' 10"'$  (1.778 m), weight 81.6 kg (180 lb), SpO2 96 %. Physical Exam: General: pleasant, WD/WN white male who is laying in bed in NAD HEENT: head is normocephalic, atraumatic. Heart: regular, rate, and rhythm.  No obvious murmurs, gallops, or rubs noted.  Palpable pedal pulses bilaterally Lungs: CTAB, no wheezes, rhonchi, or rales noted.  Respiratory effort nonlabored Abd: soft, NT/ND, +BS, left inguinal region: soft, non-tender, no palpable hernia. No overlying skin changes or erythema. MS: all 4 extremities are symmetrical with no cyanosis, clubbing, or edema. Skin: warm and dry with no masses, lesions, or rashes Psych: appropriate affect. Neuro: A&Ox3, normal speech  Results for orders placed or performed during the hospital encounter of 11/22/15 (from the past 48 hour(s))  Urinalysis, Routine w reflex microscopic (not at [facility])     Status: Abnormal   Collection Time: 11/22/15  5:43 AM  Result Value Ref Range   Color, Urine AMBER (A) YELLOW    Comment: BIOCHEMICALS MAY BE AFFECTED BY COLOR   APPearance CLOUDY (A) CLEAR   Specific Gravity, Urine 1.038 (H) 1.005 - 1.030   pH 5.0 5.0 - 8.0   Glucose,  UA NEGATIVE NEGATIVE mg/dL   Hgb urine dipstick MODERATE (A) NEGATIVE   Bilirubin Urine NEGATIVE NEGATIVE   Ketones, ur NEGATIVE NEGATIVE mg/dL   Protein, ur 30 (A) NEGATIVE mg/dL   Nitrite NEGATIVE NEGATIVE   Leukocytes, UA SMALL (A) NEGATIVE  CBC with Differential     Status: Abnormal   Collection Time: 11/22/15  5:43 AM  Result Value Ref Range   WBC 8.8 4.0 - 10.5 K/uL   RBC 4.14 (L) 4.22 - 5.81 MIL/uL   Hemoglobin 11.2 (L) 13.0 - 17.0 g/dL   HCT 35.8 (L) 39.0 - 52.0 %   MCV 86.5 78.0 - 100.0 fL   MCH 27.1 26.0 - 34.0 pg   MCHC 31.3 30.0 - 36.0 g/dL   RDW 14.9 11.5 - 15.5 %   Platelets 209 150 - 400 K/uL   Neutrophils Relative % 75 %   Neutro Abs 6.6 1.7 - 7.7 K/uL   Lymphocytes Relative 13 %  Lymphs Abs 1.1 0.7 - 4.0 K/uL   Monocytes Relative 11 %   Monocytes Absolute 1.0 0.1 - 1.0 K/uL   Eosinophils Relative 1 %   Eosinophils Absolute 0.1 0.0 - 0.7 K/uL   Basophils Relative 0 %   Basophils Absolute 0.0 0.0 - 0.1 K/uL  Comprehensive metabolic panel     Status: Abnormal   Collection Time: 11/22/15  5:43 AM  Result Value Ref Range   Sodium 128 (L) 135 - 145 mmol/L   Potassium 4.4 3.5 - 5.1 mmol/L   Chloride 92 (L) 101 - 111 mmol/L   CO2 24 22 - 32 mmol/L   Glucose, Bld 117 (H) 65 - 99 mg/dL   BUN 15 6 - 20 mg/dL   Creatinine, Ser 0.87 0.61 - 1.24 mg/dL   Calcium 9.2 8.9 - 10.3 mg/dL   Total Protein 6.4 (L) 6.5 - 8.1 g/dL   Albumin 3.6 3.5 - 5.0 g/dL   AST 26 15 - 41 U/L   ALT 28 17 - 63 U/L   Alkaline Phosphatase 75 38 - 126 U/L   Total Bilirubin 1.4 (H) 0.3 - 1.2 mg/dL   GFR calc non Af Amer >60 >60 mL/min   GFR calc Af Amer >60 >60 mL/min    Comment: (NOTE) The eGFR has been calculated using the CKD EPI equation. This calculation has not been validated in all clinical situations. eGFR's persistently <60 mL/min signify possible Chronic Kidney Disease.    Anion gap 12 5 - 15  Urine microscopic-add on     Status: Abnormal   Collection Time: 11/22/15  5:43 AM   Result Value Ref Range   Squamous Epithelial / LPF 0-5 (A) NONE SEEN   WBC, UA 6-30 0 - 5 WBC/hpf   RBC / HPF 6-30 0 - 5 RBC/hpf   Bacteria, UA FEW (A) NONE SEEN   Casts HYALINE CASTS (A) NEGATIVE   Urine-Other LESS THAN 10 mL OF URINE SUBMITTED    No results found.  Assessment/Plan Reducible left inguinal hernia - VSS, afebrile, no tachycardia or leukocytosis, currently asymptomatic and without pain after reduction   Plan: ambulate the patient and start a trial of clear liquids. If the patient tolerates these without recurrent pain or hernia he may be discharged home with follow-up in our office by Dr. Dalbert Batman. Thank you for this consult and please call with any questions or continued concerns. Appreciate EDP facilitating pt discharge and follow-up.  Jill Alexanders, [facility] Surgery 11/22/2015, 9:49 AM Pager: [phone] Consults: [phone] Mon-Fri 7:00 am-4:30 pm Sat-Sun 7:00 am-11:30 am

## 2015-11-22 NOTE — Discharge Instructions (Signed)
Your sodium was low today - please call your Cardiologist for medication adjustment.  Get rechecked immediately if you develop worsening pain in your groin, fevers, vomiting, or swelling that does not resolve with lying down.

## 2015-11-22 NOTE — ED Notes (Signed)
Wheeled pt to restroom from room. 

## 2015-11-22 NOTE — ED Notes (Signed)
surgery at bedside

## 2015-11-24 ENCOUNTER — Encounter (HOSPITAL_COMMUNITY): Payer: Self-pay | Admitting: Emergency Medicine

## 2015-11-24 ENCOUNTER — Inpatient Hospital Stay (HOSPITAL_COMMUNITY)
Admission: EM | Admit: 2015-11-24 | Discharge: 2015-12-01 | DRG: 350 | Disposition: A | Discharge status: Home / Self Care | Payer: Medicare Other | Attending: Internal Medicine | Admitting: Internal Medicine

## 2015-11-24 ENCOUNTER — Emergency Department (HOSPITAL_COMMUNITY): Payer: Medicare Other

## 2015-11-24 DIAGNOSIS — Z86010 Personal history of colonic polyps: Secondary | ICD-10-CM

## 2015-11-24 DIAGNOSIS — Z66 Do not resuscitate: Secondary | ICD-10-CM | POA: Diagnosis present

## 2015-11-24 DIAGNOSIS — Z91018 Allergy to other foods: Secondary | ICD-10-CM

## 2015-11-24 DIAGNOSIS — I509 Heart failure, unspecified: Secondary | ICD-10-CM

## 2015-11-24 DIAGNOSIS — I350 Nonrheumatic aortic (valve) stenosis: Secondary | ICD-10-CM | POA: Diagnosis not present

## 2015-11-24 DIAGNOSIS — Z880 Allergy status to penicillin: Secondary | ICD-10-CM

## 2015-11-24 DIAGNOSIS — Z79899 Other long term (current) drug therapy: Secondary | ICD-10-CM

## 2015-11-24 DIAGNOSIS — I5043 Acute on chronic combined systolic (congestive) and diastolic (congestive) heart failure: Secondary | ICD-10-CM

## 2015-11-24 DIAGNOSIS — I480 Paroxysmal atrial fibrillation: Secondary | ICD-10-CM | POA: Diagnosis present

## 2015-11-24 DIAGNOSIS — I110 Hypertensive heart disease with heart failure: Secondary | ICD-10-CM | POA: Diagnosis present

## 2015-11-24 DIAGNOSIS — E871 Hypo-osmolality and hyponatremia: Secondary | ICD-10-CM | POA: Diagnosis not present

## 2015-11-24 DIAGNOSIS — I10 Essential (primary) hypertension: Secondary | ICD-10-CM | POA: Diagnosis not present

## 2015-11-24 DIAGNOSIS — D72829 Elevated white blood cell count, unspecified: Secondary | ICD-10-CM | POA: Diagnosis present

## 2015-11-24 DIAGNOSIS — Z888 Allergy status to other drugs, medicaments and biological substances status: Secondary | ICD-10-CM

## 2015-11-24 DIAGNOSIS — K4020 Bilateral inguinal hernia, without obstruction or gangrene, not specified as recurrent: Principal | ICD-10-CM | POA: Diagnosis present

## 2015-11-24 DIAGNOSIS — K4090 Unilateral inguinal hernia, without obstruction or gangrene, not specified as recurrent: Secondary | ICD-10-CM | POA: Diagnosis not present

## 2015-11-24 DIAGNOSIS — Z8546 Personal history of malignant neoplasm of prostate: Secondary | ICD-10-CM

## 2015-11-24 DIAGNOSIS — Z8551 Personal history of malignant neoplasm of bladder: Secondary | ICD-10-CM

## 2015-11-24 DIAGNOSIS — R8290 Unspecified abnormal findings in urine: Secondary | ICD-10-CM

## 2015-11-24 LAB — BASIC METABOLIC PANEL
ANION GAP: 7 (ref 5–15)
BUN: 22 mg/dL — ABNORMAL HIGH (ref 6–20)
CALCIUM: 8.8 mg/dL — AB (ref 8.9–10.3)
CO2: 24 mmol/L (ref 22–32)
Chloride: 95 mmol/L — ABNORMAL LOW (ref 101–111)
Creatinine, Ser: 0.92 mg/dL (ref 0.61–1.24)
GLUCOSE: 102 mg/dL — AB (ref 65–99)
POTASSIUM: 4.2 mmol/L (ref 3.5–5.1)
Sodium: 126 mmol/L — ABNORMAL LOW (ref 135–145)

## 2015-11-24 LAB — CBC WITH DIFFERENTIAL/PLATELET
BASOS ABS: 0 10*3/uL (ref 0.0–0.1)
Basophils Relative: 0 %
EOS ABS: 0.1 10*3/uL (ref 0.0–0.7)
EOS PCT: 1 %
HCT: 36 % — ABNORMAL LOW (ref 39.0–52.0)
Hemoglobin: 11.5 g/dL — ABNORMAL LOW (ref 13.0–17.0)
LYMPHS PCT: 17 %
Lymphs Abs: 1.3 10*3/uL (ref 0.7–4.0)
MCH: 27.4 pg (ref 26.0–34.0)
MCHC: 31.9 g/dL (ref 30.0–36.0)
MCV: 85.7 fL (ref 78.0–100.0)
Monocytes Absolute: 0.8 10*3/uL (ref 0.1–1.0)
Monocytes Relative: 10 %
Neutro Abs: 5.8 10*3/uL (ref 1.7–7.7)
Neutrophils Relative %: 72 %
PLATELETS: 197 10*3/uL (ref 150–400)
RBC: 4.2 MIL/uL — ABNORMAL LOW (ref 4.22–5.81)
RDW: 14.9 % (ref 11.5–15.5)
WBC: 8 10*3/uL (ref 4.0–10.5)

## 2015-11-24 MED ORDER — LISINOPRIL 40 MG PO TABS
40.0000 mg | ORAL_TABLET | Freq: Every day | ORAL | Status: DC
  Administered 2015-11-25: 40 mg via ORAL
  Filled 2015-11-24: qty 1

## 2015-11-24 MED ORDER — GUAIFENESIN ER 600 MG PO TB12
600.0000 mg | ORAL_TABLET | Freq: Two times a day (BID) | ORAL | Status: DC
  Administered 2015-11-24 – 2015-12-01 (×13): 600 mg via ORAL
  Filled 2015-11-24 (×14): qty 1

## 2015-11-24 MED ORDER — TRAZODONE HCL 50 MG PO TABS
25.0000 mg | ORAL_TABLET | Freq: Every evening | ORAL | Status: DC | PRN
  Administered 2015-11-24 – 2015-11-30 (×3): 25 mg via ORAL
  Filled 2015-11-24 (×4): qty 1

## 2015-11-24 MED ORDER — SODIUM CHLORIDE 0.9 % IV SOLN: 250.0000 mL | INTRAVENOUS | Status: DC | PRN

## 2015-11-24 MED ORDER — POLYETHYLENE GLYCOL 3350 17 G PO PACK: 17.0000 g | PACK | Freq: Every day | ORAL | Status: DC | PRN

## 2015-11-24 MED ORDER — HYDROCODONE-ACETAMINOPHEN 5-325 MG PO TABS
1.0000 | ORAL_TABLET | ORAL | Status: DC | PRN
  Filled 2015-11-24: qty 1

## 2015-11-24 MED ORDER — IOPAMIDOL (ISOVUE-300) INJECTION 61%
INTRAVENOUS | Status: AC
  Administered 2015-11-24: 100 mL
  Filled 2015-11-24: qty 100

## 2015-11-24 MED ORDER — DIATRIZOATE MEGLUMINE & SODIUM 66-10 % PO SOLN
ORAL | Status: AC
  Filled 2015-11-24: qty 30

## 2015-11-24 MED ORDER — SODIUM CHLORIDE 0.9 % IV SOLN
INTRAVENOUS | Status: DC
  Administered 2015-11-24: 13:00:00 via INTRAVENOUS

## 2015-11-24 MED ORDER — ONDANSETRON HCL 4 MG/2ML IJ SOLN: 4.0000 mg | Freq: Four times a day (QID) | INTRAMUSCULAR | Status: DC | PRN

## 2015-11-24 MED ORDER — BISACODYL 5 MG PO TBEC
5.0000 mg | DELAYED_RELEASE_TABLET | Freq: Every day | ORAL | Status: DC | PRN
  Administered 2015-11-29: 5 mg via ORAL
  Filled 2015-11-24: qty 1

## 2015-11-24 MED ORDER — ACETAMINOPHEN 325 MG PO TABS: 650.0000 mg | ORAL_TABLET | ORAL | Status: DC | PRN

## 2015-11-24 MED ORDER — FUROSEMIDE 40 MG PO TABS: 40.0000 mg | ORAL_TABLET | Freq: Every day | ORAL | Status: DC

## 2015-11-24 MED ORDER — SODIUM CHLORIDE 0.9% FLUSH
3.0000 mL | Freq: Two times a day (BID) | INTRAVENOUS | Status: DC
  Administered 2015-11-24 – 2015-11-30 (×10): 3 mL via INTRAVENOUS

## 2015-11-24 MED ORDER — ONDANSETRON HCL 4 MG PO TABS: 4.0000 mg | ORAL_TABLET | Freq: Four times a day (QID) | ORAL | Status: DC | PRN

## 2015-11-24 MED ORDER — FUROSEMIDE 40 MG PO TABS
40.0000 mg | ORAL_TABLET | Freq: Two times a day (BID) | ORAL | Status: DC
  Administered 2015-11-25 (×2): 40 mg via ORAL
  Filled 2015-11-24 (×2): qty 1

## 2015-11-24 MED ORDER — TRIAMCINOLONE ACETONIDE 55 MCG/ACT NA AERO
1.0000 | INHALATION_SPRAY | Freq: Every day | NASAL | Status: DC | PRN
  Filled 2015-11-24: qty 10.8

## 2015-11-24 MED ORDER — FUROSEMIDE 10 MG/ML IJ SOLN
40.0000 mg | INTRAMUSCULAR | Status: AC
  Administered 2015-11-24: 40 mg via INTRAVENOUS
  Filled 2015-11-24: qty 4

## 2015-11-24 MED ORDER — PANTOPRAZOLE SODIUM 40 MG PO TBEC
40.0000 mg | DELAYED_RELEASE_TABLET | Freq: Every day | ORAL | Status: DC
  Administered 2015-11-24 – 2015-11-30 (×7): 40 mg via ORAL
  Filled 2015-11-24 (×7): qty 1

## 2015-11-24 MED ORDER — SODIUM CHLORIDE 0.9% FLUSH: 3.0000 mL | INTRAVENOUS | Status: DC | PRN

## 2015-11-24 NOTE — ED Triage Notes (Signed)
Here Thursday for left inguinal hernia; able to reduce in ED. Came back out this morning. Pain present in groin.

## 2015-11-24 NOTE — ED Provider Notes (Signed)
[place] DEPT Provider Note   CSN: [id number] Arrival date & time: 11/24/15  1150  First Provider Contact:  None       History   Chief Complaint Chief Complaint  Patient presents with  . Inguinal Hernia    HPI ZAHAR VOWLES is a 82 y.o. male.  82 year old male presents with left inguinal hernia times several days that has become worse over 24 hours. Seen here several days ago for similar symptoms and the hernia was able to be reduced. This morning when he awoke he noted fullness in his left inguinal canal without vomiting or fever. Pain is persistent sharp and worse with standing. He attempted to reduce the hernia without success. Patient has surgery follow-up scheduled.      Past Medical History:  Diagnosis Date  . Cataracts, bilateral   . Detached retina Twice   As a result he is lost vision in the left eye.  . Diverticulosis 2006  . Hx of colonic polyps 2006,  2011  . Hypertension   . Paroxysmal atrial fibrillation (HCC)    On Pradaxa.   . Prostate cancer ([place]) 2004   seed implant  . 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 Valvular heart disease    MV regurge and Ao stenosis  . Vertigo     Patient Active Problem List   Diagnosis Date Noted  . Severe aortic valve stenosis   . Aortic stenosis 03/15/2015  . Lower GI bleeding 11/13/2014  . Melena 11/13/2014  . Hypertension   . Paroxysmal atrial fibrillation (HCC)   . Mitral regurgitation     Past Surgical History:  Procedure Laterality Date  . CARDIAC CATHETERIZATION N/A 04/19/2015   Procedure: Right/Left Heart Cath and Coronary Angiography;  Surgeon: Burnell Blanks, MD;  Location: [place] [facility];  Service: Cardiovascular;  Laterality: N/A;  . CATARACT EXTRACTION    . COLONOSCOPY  2006 and 11/2009   tubular adenomas on both studies. int rrhoids, diverticulosis.   . COLONOSCOPY N/A 11/15/2014   Procedure: COLONOSCOPY;  Surgeon: Milus Banister, MD;  Location: [place];  Service: Endoscopy;  Laterality: N/A;  . 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 Focal urothelial atypia in 2006.  Marland Kitchen ESOPHAGOGASTRODUODENOSCOPY N/A 11/15/2014   Procedure: ESOPHAGOGASTRODUODENOSCOPY (EGD);  Surgeon: Milus Banister, MD;  Location: [place];  Service: Endoscopy;  Laterality: N/A;  . PLEURAL EFFUSION DRAINAGE  2010  . RADIOACTIVE SEED IMPLANT  2004   For treatment of prostate cancer  . SQUAMOUS CELL CARCINOMA EXCISION  2002, 2003, 2006.  . TEE WITHOUT CARDIOVERSION N/A 05/14/2015   Procedure: TRANSESOPHAGEAL ECHOCARDIOGRAM (TEE);  Surgeon: Larey Dresser, MD;  Location: [place];  Service: Cardiovascular;  Laterality: N/A;  . THORACENTESIS    . TONSILLECTOMY         Home Medications    Prior to Admission medications   Medication Sig Start Date End Date Taking? Authorizing Provider  acetaminophen (TYLENOL) 500 MG tablet Take 500 mg by mouth daily as needed for mild pain.    Historical Provider, MD  clindamycin (CLEOCIN-T) 1 % gel Apply 1 application topically daily as needed (for skin lesions).     Historical Provider, MD  dabigatran (PRADAXA) 150 MG CAPS capsule Take 1 capsule (150 mg total) by mouth every 12 (twelve) hours. 11/14/15   Minus Breeding, MD  furosemide (LASIX) 40 MG tablet Take 1 tablet (40 mg  total) by mouth daily. 11/15/15   Minus Breeding, MD  lisinopril (PRINIVIL,ZESTRIL) 40 MG tablet Take 40 mg by mouth daily. 11/19/13   Historical Provider, MD  loratadine (CLARITIN) 10 MG tablet Take 10 mg by mouth daily as needed for allergies.     Historical Provider, MD  metroNIDAZOLE (METROCREAM) 0.75 % cream Apply 1 application topically 2 (two) times daily as needed (for skin lesions).     Historical Provider, MD  Misc Natural Products (RED WINE EXTRACT PO) Take 1 tablet by mouth 2 (two) times a week.    Historical Provider, MD  Multiple Vitamins-Minerals (MULTIVITAMIN WITH  MINERALS) tablet Take 1 tablet by mouth daily.    Historical Provider, MD  Omega-3 Fatty Acids (FISH OIL PO) Take 1 capsule by mouth daily.     Historical Provider, MD  omeprazole (PRILOSEC) 40 MG capsule Take 40 mg by mouth daily. 12/18/14   Historical Provider, MD  triamcinolone (NASACORT ALLERGY 24HR) 55 MCG/ACT AERO nasal inhaler Place 1 spray into both nostrils daily as needed (for congestion).     Historical Provider, MD  vitamin C (ASCORBIC ACID) 500 MG tablet Take 500 mg by mouth daily.    Historical Provider, MD    Family History Family History  Problem Relation Age of Onset  . Other Father 24     of a PE after surgery  . Cancer - Other Mother   . Other       there is no known coronary artery disease     Social History Social History  Substance Use Topics  . Smoking status: Never Smoker  . Smokeless tobacco: Never Used  . Alcohol use Yes     Comment: occassionally     Allergies   Ambien [zolpidem tartrate]; Other; Ciprofloxacin; Diamox [acetazolamide]; Doxycycline; Erythromycin; Neptazane [methazolamide]; and Penicillins   Review of Systems Review of Systems  All other systems reviewed and are negative.    Physical Exam Updated Vital Signs BP 111/71 (BP Location: Left Arm)   Pulse 80   Temp 97.6 F (36.4 C) (Oral)   Resp 14   SpO2 98%   Physical Exam  Constitutional: He is oriented to person, place, and time. He appears well-developed and well-nourished.  Non-toxic appearance. No distress.  HENT:  Head: Normocephalic and atraumatic.  Eyes: Conjunctivae, EOM and lids are normal. Pupils are equal, round, and reactive to light.  Neck: Normal range of motion. Neck supple. No tracheal deviation present. No thyroid mass present.  Cardiovascular: Normal rate, regular rhythm and normal heart sounds.  Exam reveals no gallop.   No murmur heard. Pulmonary/Chest: Effort normal and breath sounds normal. No stridor. No respiratory distress. He has no decreased breath  sounds. He has no wheezes. He has no rhonchi. He has no rales.  Abdominal: Soft. Normal appearance and bowel sounds are normal. He exhibits no distension. There is no tenderness. There is no rebound and no CVA tenderness.  Genitourinary: Uncircumcised.     Musculoskeletal: Normal range of motion. He exhibits no edema or tenderness.  Neurological: He is alert and oriented to person, place, and time. He has normal strength. No cranial nerve deficit or sensory deficit. GCS eye subscore is 4. GCS verbal subscore is 5. GCS motor subscore is 6.  Skin: Skin is warm and dry. No abrasion and no rash noted.  Psychiatric: He has a normal mood and affect. His speech is normal and behavior is normal.  Nursing note and vitals reviewed.    ED Treatments /  Results  Labs (all labs ordered are listed, but only abnormal results are displayed) Labs Reviewed  CBC WITH DIFFERENTIAL/PLATELET  BASIC METABOLIC PANEL    EKG  EKG Interpretation None       Radiology No results found.  Procedures Procedures (including critical care time)  Medications Ordered in ED Medications  0.9 %  sodium chloride infusion (not administered)     Initial Impression / Assessment and Plan / ED Course  I have reviewed the triage vital signs and the nursing notes.  Pertinent labs & imaging results that were available during my care of the patient were reviewed by me and considered in my medical decision making (see chart for details).  Clinical Course   Case discussed with Dr. Donne Hazel for general surgery who recommended abdominal CT which does show bowel in the inguinal canal but no signs of obstruction. He will come and see the patient recommends the patient be admitted to the medicine service  Final Clinical Impressions(s) / ED Diagnoses   Final diagnoses:  None    New Prescriptions New Prescriptions   No medications on file     Lacretia Leigh, MD 11/24/15 1545

## 2015-11-24 NOTE — H&P (Signed)
History and Physical    AUM TEER [id number] DOB: 21-Jul-1928 DOA: 11/24/2015  PCP: Geoffery Lyons, MD  Patient coming from:   Home    Chief Complaint: Pain in left groin  HPI: DESMEN GOETTER is a 82 y.o. male with a medical history significant for, but not  limited to, PAF, prostate cancer, hypertension. Patient was in the emergency department 2 days ago with left inguinal bulge and discomfort. Patient was seen at [facility] surgery in the emergency department where a left inguinal hernia was reduced and patient discharged with plans for outpatient follow-up. Patient return to emergency department today for recurrent bulge and pain. Pain is much worse with moving about and standing. No fevers. No nausea or vomiting.  ED Course:  Afebrile, hemodynamically stable.  Sodium 126, glucose 102 normal WBC, hemoglobin 11.5 Urinalysis cloudy, few bacteria, small amount of leukocytes, 6-30 WBCs   Review of Systems: As per HPI, otherwise 10 point review of systems negative.   Past Medical History:  Diagnosis Date  . Cataracts, bilateral   . Detached retina Twice   As a result he is lost vision in the left eye.  . Diverticulosis 2006  . Hx of colonic polyps 2006,  2011  . Hypertension   . Paroxysmal atrial fibrillation (HCC)    On Pradaxa.   . Prostate cancer ([place]) 2004   seed implant  . 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 Valvular heart disease    MV regurge and Ao stenosis  . Vertigo     Past Surgical History:  Procedure Laterality Date  . CARDIAC CATHETERIZATION N/A 04/19/2015   Procedure: Right/Left Heart Cath and Coronary Angiography;  Surgeon: Burnell Blanks, MD;  Location: [facility];  Service: Cardiovascular;  Laterality: N/A;  . CATARACT EXTRACTION    . COLONOSCOPY  2006 and 11/2009   tubular adenomas on both studies. int rrhoids, diverticulosis.   . COLONOSCOPY N/A 11/15/2014   Procedure: COLONOSCOPY;  Surgeon: Milus Banister, MD;  Location: [place] [facility];  Service: Endoscopy;  Laterality: N/A;  . 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 Focal urothelial atypia in 2006.  Marland Kitchen ESOPHAGOGASTRODUODENOSCOPY N/A 11/15/2014   Procedure: ESOPHAGOGASTRODUODENOSCOPY (EGD);  Surgeon: Milus Banister, MD;  Location: [facility];  Service: Endoscopy;  Laterality: N/A;  . PLEURAL EFFUSION DRAINAGE  2010  . RADIOACTIVE SEED IMPLANT  2004   For treatment of prostate cancer  . SQUAMOUS CELL CARCINOMA EXCISION  2002, 2003, 2006.  . TEE WITHOUT CARDIOVERSION N/A 05/14/2015   Procedure: TRANSESOPHAGEAL ECHOCARDIOGRAM (TEE);  Surgeon: Larey Dresser, MD;  Location: [facility];  Service: Cardiovascular;  Laterality: N/A;  . THORACENTESIS    . TONSILLECTOMY      Social History   Social History  . Marital status: Married    Spouse name: N/A  . Number of children: 1  . Years of education: N/A   Occupational History  . retired-Magistrate Retired   Social History Main Topics  . Smoking status: Never Smoker  . Smokeless tobacco: Never Used  . Alcohol use Yes     Comment: occassionally  . Drug use: No  . Sexual activity: Not on file   Other Topics Concern  . Not on file   Social History Narrative   [employer]   Exercises 5-6 days a week    Blood type O Positive   Lowe sodium diet  Patient is married, lives with wife. He uses a cane for ambulation  Allergies  Allergen Reactions  . Ambien [Zolpidem Tartrate] Other (See Comments)    Per patient and wife "hallucinations and sleep walking" (tried to climb out of the windows in the middle of the night)  . Other Other (See Comments)    Nuts, pecans only--unknown  . Ciprofloxacin Rash  . Diamox [Acetazolamide] Other (See Comments)    unknown  . Doxycycline Rash  . Erythromycin Rash  . Neptazane [Methazolamide] Other (See Comments)    unknown  . Penicillins Swelling and Rash    Has patient  had a PCN reaction causing immediate rash, facial/tongue/throat swelling, SOB or lightheadedness with hypotension: unknown Has patient had a PCN reaction causing severe rash involving mucus membranes or skin necrosis: No Has patient had a PCN reaction that required hospitalization No Has patient had a PCN reaction occurring within the last 10 years: No If all of the above answers are "NO", then may proceed with Cephalosporin use.     Family History  Problem Relation Age of Onset  . Other Father 8     of a PE after surgery  . Cancer - Other Mother   . Other       there is no known coronary artery disease     Prior to Admission medications   Medication Sig Start Date End Date Taking? Authorizing Provider  acetaminophen (TYLENOL) 500 MG tablet Take 500 mg by mouth daily as needed for mild pain.    Historical Provider, MD  clindamycin (CLEOCIN-T) 1 % gel Apply 1 application topically daily as needed (for skin lesions).     Historical Provider, MD  dabigatran (PRADAXA) 150 MG CAPS capsule Take 1 capsule (150 mg total) by mouth every 12 (twelve) hours. 11/14/15   Minus Breeding, MD  furosemide (LASIX) 40 MG tablet Take 1 tablet (40 mg total) by mouth daily. 11/15/15   Minus Breeding, MD  lisinopril (PRINIVIL,ZESTRIL) 40 MG tablet Take 40 mg by mouth daily. 11/19/13   Historical Provider, MD  loratadine (CLARITIN) 10 MG tablet Take 10 mg by mouth daily as needed for allergies.     Historical Provider, MD  metroNIDAZOLE (METROCREAM) 0.75 % cream Apply 1 application topically 2 (two) times daily as needed (for skin lesions).     Historical Provider, MD  Misc Natural Products (RED WINE EXTRACT PO) Take 1 tablet by mouth 2 (two) times a week.    Historical Provider, MD  Multiple Vitamins-Minerals (MULTIVITAMIN WITH MINERALS) tablet Take 1 tablet by mouth daily.    Historical Provider, MD  Omega-3 Fatty Acids (FISH OIL PO) Take 1 capsule by mouth daily.     Historical Provider, MD  omeprazole (PRILOSEC)  40 MG capsule Take 40 mg by mouth daily. 12/18/14   Historical Provider, MD  triamcinolone (NASACORT ALLERGY 24HR) 55 MCG/ACT AERO nasal inhaler Place 1 spray into both nostrils daily as needed (for congestion).     Historical Provider, MD  vitamin C (ASCORBIC ACID) 500 MG tablet Take 500 mg by mouth daily.    Historical Provider, MD    Physical Exam: Vitals:   11/24/15 1545 11/24/15 1600 11/24/15 1615 11/24/15 1630  BP: 107/79 105/80 114/99 135/88  Pulse: 86 91 88 94  Resp:      Temp:      TempSrc:      SpO2: 99% 100% 98% 100%    Constitutional:  Pleasant thin white male in NAD, calm, comfortable Vitals:  11/24/15 1545 11/24/15 1600 11/24/15 1615 11/24/15 1630  BP: 107/79 105/80 114/99 135/88  Pulse: 86 91 88 94  Resp:      Temp:      TempSrc:      SpO2: 99% 100% 98% 100%   Eyes: PER, lids and conjunctivae normal ENMT: Mucous membranes are moist. Posterior pharynx clear of any exudate or lesions..  Neck: normal, supple, no masses Respiratory: clear to auscultation bilaterally, no wheezing, no crackles. Normal respiratory effort. No accessory muscle use.  Cardiovascular: Regular rate and rhythm, 2+BLE edema. 2+ dorsal pedis pulses.   Abdomen: no tenderness, no masses palpated. No hepatomegaly. Bowel sounds positive. Left inguinal hernia present but not particularly tender  Musculoskeletal: no clubbing / cyanosis. No joint deformity upper and lower extremities. Good ROM, no contractures. Normal muscle tone.  Skin: no rashes, lesions, ulcers.  Neurologic: CN 2-12 grossly intact. Sensation intact, Strength 5/5 in all 4.  Psychiatric: Normal judgment and insight. Alert and oriented x 3. Normal mood.   Labs on Admission: I have personally reviewed following labs and imaging studies   Urine analysis:    Component Value Date/Time   COLORURINE AMBER (A) 11/22/2015 0543   APPEARANCEUR CLOUDY (A) 11/22/2015 0543   LABSPEC 1.038 (H) 11/22/2015 0543   PHURINE 5.0 11/22/2015 0543    GLUCOSEU NEGATIVE 11/22/2015 0543   HGBUR MODERATE (A) 11/22/2015 0543   BILIRUBINUR NEGATIVE 11/22/2015 0543   KETONESUR NEGATIVE 11/22/2015 0543   PROTEINUR 30 (A) 11/22/2015 0543   UROBILINOGEN 0.2 10/29/2010 1613   NITRITE NEGATIVE 11/22/2015 0543   LEUKOCYTESUR SMALL (A) 11/22/2015 0543    Radiological Exams on Admission: Ct Abdomen Pelvis W Contrast  Result Date: 11/24/2015 CLINICAL DATA:  LEFT lower quadrant abdominal pain radiating to groin intermittently for 4-5 weeks, LEFT inguinal hernia, history valvular heart disease, atrial fibrillation, prostate cancer, diverticulosis, hypertension EXAM: CT ABDOMEN AND PELVIS WITH CONTRAST TECHNIQUE: Multidetector CT imaging of the abdomen and pelvis was performed using the standard protocol following bolus administration of intravenous contrast. Sagittal and coronal MPR images reconstructed from axial data set. CONTRAST:  162mL ISOVUE-300 IOPAMIDOL (ISOVUE-300) INJECTION 61% IV. Dilute oral contrast. COMPARISON:  07/01/2015 FINDINGS: Lower chest: BILATERAL pleural effusions and minimal compressive atelectasis lower lobes. Fell Hepatobiliary: Suspect tiny dependent calcified gallstone in gallbladder. Liver unremarkable Pancreas: Normal appearance Spleen: Normal appearance Adrenals/Urinary Tract: Adrenal glands normal appearance. Nonobstructing LEFT renal calculus. No renal mass, hydronephrosis or hydroureter. Mild bladder wall thickening question in part related to incomplete distention. Brachytherapy seed implants at prostate bed. Stomach/Bowel: Normal appendix. Segment of nonobstructed sigmoid colon extends into a LEFT inguinal hernia. No associated bowel wall thickening or obstruction. Mild sigmoid diverticulosis. Stomach collapsed, unable to exclude gastric wall thickening. Small bowel loops normal appearance. Vascular/Lymphatic: Atherosclerotic calcifications aorta, iliac arteries, coronary arteries, abdominal branch arteries including the origins of  the celiac and superior mesenteric arteries as well as RIGHT renal artery. Heart appears mildly enlarged. No adenopathy. Distended IVC and hepatic veins with reflux of contrast from RIGHT atrium into IVC and hepatic veins, in combination with delayed portal vein visualization raising question bold elevated RIGHT heart pressure and failure. Reproductive: N/A Other: Scattered ascites in abdomen and pelvis as well as within BILATERAL inguinal hernias LEFT larger than RIGHT. Scattered soft tissue edema diffusely including presacral space terrible question related to hypoproteinemia or anasarca. Musculoskeletal: Osseous demineralization with degenerative disc disease changes lumbar spine. Degenerative changes of both hips. No acute osseous findings. IMPRESSION: BILATERAL pleural effusions and ascites as well  as scattered soft tissue edema, could be due to failure normal hyperproteinemia, or anasarca. Suspect tiny calcified gallstone. Nonobstructing LEFT renal calculus. BILATERAL inguinal hernias containing fat and fluid on RIGHT and containing ascites and nonobstructed sigmoid colon on LEFT. Sigmoid diverticulosis. Aortic atherosclerosis and mild coronary artery calcification with supected elevated RIGHT heart pressure. Electronically Signed   By: Lavonia Dana M.D.   On: 11/24/2015 15:27   Echo Sept 2016  Study Conclusions  - Left ventricle: The cavity size was mildly dilated. Wall   thickness was increased in a pattern of mild LVH. Systolic   function was normal. The estimated ejection fraction was in the   range of 55% to 60%. Doppler parameters are consistent with   elevated ventricular end-diastolic filling pressure. - Aortic valve: There was severe stenosis. Valve area (VTI): 0.52   cm^2. Valve area (Vmax): 0.45 cm^2. Valve area (Vmean): 0.47   cm^2. - Mitral valve: Severe posterior annular calcification with at   least moderate MR. Possible flail segment to posterior leaflet   Suggest TEE to    evaluate MR in light of likely need for surgery of Aortic Valve.   Calcified annulus. There was moderate regurgitation. - Left atrium: The atrium was severely dilated. - Atrial septum: No defect or patent foramen ovale was identified.  Transthoracic echocardiography.  M-mode, complete 2D, spectral Doppler, and color Doppler.  Birthdate:  Patient birthdate: 1928-09-02.  Age:  Patient is 82 yr old.  Sex:  Gender: male. BMI: 30 kg/m^2.  Blood pressure:     162/98  Patient status: Outpatient.  Study date:  Study date: 12/20/2014. Study time: 10:12 AM.  Location:  Echo laboratory.  -------------------------------------------------------------------  ------------------------------------------------------------------- Left ventricle:  The cavity size was mildly dilated. Wall thickness was increased in a pattern of mild LVH. Systolic function was normal. The estimated ejection fraction was in the range of 55% to 60%. Doppler parameters are consistent with elevated ventricular end-diastolic filling pressure.  -------------------------------------------------------------------  Assessment/Plan   Active Problems:   Hypertension   Paroxysmal atrial fibrillation (HCC)   Aortic stenosis   Inguinal hernia, left   Hyponatremia   Abnormal urinalysis    Left inguinal hernia, reduced two days ago but re-herniated with associated pain. CT scan today does show segment of nonobstructed sigmoid colon extending into the hernia. No bowel wall thickening or obstruction. -Admit to medical bed -Appreciate surgery's assistance. Sounds like patient is scheduled tentatively for surgery on Tuesday if medically stable -Holding pradaxa  Severe AS/MR, not good candidate for TAVR  Last echo Sept 2016 showed estimated ejection fraction was in the  range of 55% to 60%, elevated ventricular end-diastolic filling pressure. Now with dyspnea, bilateral pleural effusions and ascites on CT scan today. -repeat  Echocardiogram -took am dose of lasix. Bea Graff an additional dose of 40mg  IV lasix now -double po home lasix, takes QOD so increase to daily starting in am- -daily wts -intake and output -Cardiology needs to be consulted tomorrow. Will need Cardiac clearance for surgery  Hypertension, controlled -continue home ACEI   Atrial fibrillation, rate controlled. Chadsvasc 4. On Pradaxa.  -Hold home pradaxa for surgery .             Hyponatremia.  Sodium 126, down from 133 last week -am bmet. Should improve with excretion of free water  Abnormal u/a - + WBC -send for culture  DVT prophylaxis:   On Pradaxa. Add SCDs  Code Status:     DNR  Family Communication:  Treatment plan discussed with wife and son in the room and they understand and agree with the plan..  Disposition Plan:   Discharge home in 4-5 days              Consults called:  General Surgery - Dr. Donne Hazel Admission status:    Admission -  Medical bed    Tye Savoy NP [facility] Pager [phone]  If 7PM-7AM, please contact night-coverage [email or web address] Password [facility]  11/24/2015, 4:53 PM

## 2015-11-24 NOTE — Progress Notes (Signed)
Subjective: Aaron Lucas was seen late last week by our practice.  Was referred back to see Dr Dalbert Batman for this and then this morning he noted left groin pain again he returned to the ER.  He is not having any n/v, he is having bms and passing flatus.    Objective: Vital signs in last 24 hours: Temp:  [97.6 F (36.4 C)] 97.6 F (36.4 C) (08/13 1200) Pulse Rate:  [80-94] 94 (08/13 1630) Resp:  [14] 14 (08/13 1200) BP: (105-135)/(71-99) 135/88 (08/13 1630) SpO2:  [98 %-100 %] 100 % (08/13 1630)    Intake/Output from previous day: No intake/output data recorded. Intake/Output this shift: No intake/output data recorded.  abd soft nontender, his left ih is not tender, I did eventually reduce it today but comes right back out, dont really feel a rih   Lab Results:   Recent Labs  11/22/15 0543 11/24/15 1312  WBC 8.8 8.0  HGB 11.2* 11.5*  HCT 35.8* 36.0*  PLT 209 197   BMET  Recent Labs  11/22/15 0543 11/24/15 1312  NA 128* 126*  K 4.4 4.2  CL 92* 95*  CO2 24 24  GLUCOSE 117* 102*  BUN 15 22*  CREATININE 0.87 0.92  CALCIUM 9.2 8.8*   PT/INR No results for input(s): LABPROT, INR in the last 72 hours. ABG No results for input(s): PHART, HCO3 in the last 72 hours.  Invalid input(s): PCO2, PO2  Studies/Results: Ct Abdomen Pelvis W Contrast  Result Date: 11/24/2015 CLINICAL DATA:  LEFT lower quadrant abdominal pain radiating to groin intermittently for 4-5 weeks, LEFT inguinal hernia, history valvular heart disease, atrial fibrillation, prostate cancer, diverticulosis, hypertension EXAM: CT ABDOMEN AND PELVIS WITH CONTRAST TECHNIQUE: Multidetector CT imaging of the abdomen and pelvis was performed using the standard protocol following bolus administration of intravenous contrast. Sagittal and coronal MPR images reconstructed from axial data set. CONTRAST:  158mL ISOVUE-300 IOPAMIDOL (ISOVUE-300) INJECTION 61% IV. Dilute oral contrast. COMPARISON:  07/01/2015 FINDINGS:  Lower chest: BILATERAL pleural effusions and minimal compressive atelectasis lower lobes. Fell Hepatobiliary: Suspect tiny dependent calcified gallstone in gallbladder. Liver unremarkable Pancreas: Normal appearance Spleen: Normal appearance Adrenals/Urinary Tract: Adrenal glands normal appearance. Nonobstructing LEFT renal calculus. No renal mass, hydronephrosis or hydroureter. Mild bladder wall thickening question in part related to incomplete distention. Brachytherapy seed implants at prostate bed. Stomach/Bowel: Normal appendix. Segment of nonobstructed sigmoid colon extends into a LEFT inguinal hernia. No associated bowel wall thickening or obstruction. Mild sigmoid diverticulosis. Stomach collapsed, unable to exclude gastric wall thickening. Small bowel loops normal appearance. Vascular/Lymphatic: Atherosclerotic calcifications aorta, iliac arteries, coronary arteries, abdominal branch arteries including the origins of the celiac and superior mesenteric arteries as well as RIGHT renal artery. Heart appears mildly enlarged. No adenopathy. Distended IVC and hepatic veins with reflux of contrast from RIGHT atrium into IVC and hepatic veins, in combination with delayed portal vein visualization raising question bold elevated RIGHT heart pressure and failure. Reproductive: N/A Other: Scattered ascites in abdomen and pelvis as well as within BILATERAL inguinal hernias LEFT larger than RIGHT. Scattered soft tissue edema diffusely including presacral space terrible question related to hypoproteinemia or anasarca. Musculoskeletal: Osseous demineralization with degenerative disc disease changes lumbar spine. Degenerative changes of both hips. No acute osseous findings. IMPRESSION: BILATERAL pleural effusions and ascites as well as scattered soft tissue edema, could be due to failure normal hyperproteinemia, or anasarca. Suspect tiny calcified gallstone. Nonobstructing LEFT renal calculus. BILATERAL inguinal hernias  containing fat and fluid on RIGHT and containing  ascites and nonobstructed sigmoid colon on LEFT. Sigmoid diverticulosis. Aortic atherosclerosis and mild coronary artery calcification with supected elevated RIGHT heart pressure. Electronically Signed   By: Lavonia Dana M.D.   On: 11/24/2015 15:27     Assessment/Plan: LIH  I think this needs to be repaired.  He took pradaxa this morning though. I dont think this is urgent at this point.  I think best case is to allow pradaxa to wear off and do this while he is here.  I think he could be maximized medically prior to repair also and would recommend cardiology consult.  I discussed open hernia repair with mesh.   Aaron Lucas 11/24/2015

## 2015-11-24 NOTE — ED Notes (Signed)
Pt states that he only has pain when "that spot is touched, or if I am in certain positions."

## 2015-11-25 ENCOUNTER — Encounter (HOSPITAL_COMMUNITY): Payer: Self-pay | Admitting: Cardiology

## 2015-11-25 DIAGNOSIS — I5032 Chronic diastolic (congestive) heart failure: Secondary | ICD-10-CM | POA: Diagnosis not present

## 2015-11-25 DIAGNOSIS — I350 Nonrheumatic aortic (valve) stenosis: Secondary | ICD-10-CM

## 2015-11-25 DIAGNOSIS — I480 Paroxysmal atrial fibrillation: Secondary | ICD-10-CM

## 2015-11-25 DIAGNOSIS — I10 Essential (primary) hypertension: Secondary | ICD-10-CM | POA: Diagnosis not present

## 2015-11-25 DIAGNOSIS — K469 Unspecified abdominal hernia without obstruction or gangrene: Secondary | ICD-10-CM | POA: Insufficient documentation

## 2015-11-25 DIAGNOSIS — K4090 Unilateral inguinal hernia, without obstruction or gangrene, not specified as recurrent: Secondary | ICD-10-CM

## 2015-11-25 DIAGNOSIS — R8290 Unspecified abnormal findings in urine: Secondary | ICD-10-CM

## 2015-11-25 DIAGNOSIS — I5033 Acute on chronic diastolic (congestive) heart failure: Secondary | ICD-10-CM

## 2015-11-25 DIAGNOSIS — E871 Hypo-osmolality and hyponatremia: Secondary | ICD-10-CM | POA: Diagnosis not present

## 2015-11-25 DIAGNOSIS — Z01818 Encounter for other preprocedural examination: Secondary | ICD-10-CM

## 2015-11-25 HISTORY — DX: Chronic diastolic (congestive) heart failure: I50.32

## 2015-11-25 LAB — BASIC METABOLIC PANEL
Anion gap: 9 (ref 5–15)
BUN: 20 mg/dL (ref 6–20)
CALCIUM: 8.8 mg/dL — AB (ref 8.9–10.3)
CHLORIDE: 94 mmol/L — AB (ref 101–111)
CO2: 27 mmol/L (ref 22–32)
CREATININE: 1.01 mg/dL (ref 0.61–1.24)
GFR calc non Af Amer: >60 mL/min (ref >60)
GLUCOSE: 90 mg/dL (ref 65–99)
Potassium: 3.8 mmol/L (ref 3.5–5.1)
Sodium: 130 mmol/L — ABNORMAL LOW (ref 135–145)

## 2015-11-25 LAB — BRAIN NATRIURETIC PEPTIDE: B NATRIURETIC PEPTIDE 5: 1831.5 pg/mL — AB (ref 0.0–100.0)

## 2015-11-25 NOTE — Consult Note (Signed)
Cardiology Consult    Patient ID: Aaron Lucas, DOB/AGE: [birth year]   Admit date: 11/24/2015 Date of Consult: 11/25/2015  Primary Physician: Aaron Lyons, MD Primary Cardiologist: Dr. Percival Lucas Requesting Provider: Dr. Dyann Lucas Reason for Consultation: Pre op Clearance  Patient Profile    82 yo with PMH of PAF (on pradaxa)/ prostate CA/MR and AS who presented to the [facility] ED with left inguinal hernia, which was planned to be corrected in the outpatient setting, with worsening pain.    Past Medical History   Past Medical History:  Diagnosis Date  . Cataracts, bilateral   . Detached retina Twice   As a result he is lost vision in the left eye.  . Diverticulosis 2006  . Hx of colonic polyps 2006,  2011  . Hypertension   . Paroxysmal atrial fibrillation (HCC)    On Pradaxa.   . Prostate cancer ([place]) 2004   seed implant  . 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 Valvular heart disease    MV regurge and Ao stenosis  . Vertigo     Past Surgical History:  Procedure Laterality Date  . CARDIAC CATHETERIZATION N/A 04/19/2015   Procedure: Right/Left Heart Cath and Coronary Angiography;  Surgeon: Aaron Blanks, MD;  Location: [place] [facility];  Service: Cardiovascular;  Laterality: N/A;  . CATARACT EXTRACTION    . COLONOSCOPY  2006 and 11/2009   tubular adenomas on both studies. int rrhoids, diverticulosis.   . COLONOSCOPY N/A 11/15/2014   Procedure: COLONOSCOPY;  Surgeon: Aaron Banister, MD;  Location: [place];  Service: Endoscopy;  Laterality: N/A;  . 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 Focal urothelial atypia in 2006.  Marland Kitchen ESOPHAGOGASTRODUODENOSCOPY N/A 11/15/2014   Procedure: ESOPHAGOGASTRODUODENOSCOPY (EGD);  Surgeon: Aaron Banister, MD;  Location: [place];  Service: Endoscopy;  Laterality: N/A;  . PLEURAL EFFUSION DRAINAGE  2010  .  RADIOACTIVE SEED IMPLANT  2004   For treatment of prostate cancer  . SQUAMOUS CELL CARCINOMA EXCISION  2002, 2003, 2006.  . TEE WITHOUT CARDIOVERSION N/A 05/14/2015   Procedure: TRANSESOPHAGEAL ECHOCARDIOGRAM (TEE);  Surgeon: Aaron Dresser, MD;  Location: [place];  Service: Cardiovascular;  Laterality: N/A;  . THORACENTESIS    . TONSILLECTOMY       Allergies  Allergies  Allergen Reactions  . Ambien [Zolpidem Tartrate] Other (See Comments)    Per patient and wife "hallucinations and sleep walking" (tried to climb out of the windows in the middle of the night)  . Other Other (See Comments)    Nuts, pecans only--unknown  . Ciprofloxacin Rash  . Diamox [Acetazolamide] Other (See Comments)    unknown  . Doxycycline Rash  . Erythromycin Rash  . Neptazane [Methazolamide] Other (See Comments)    unknown  . Penicillins Swelling and Rash    Has patient had a PCN reaction causing immediate rash, facial/tongue/throat swelling, SOB or lightheadedness with hypotension: unknown Has patient had a PCN reaction causing severe rash involving mucus membranes or skin necrosis: No Has patient had a PCN reaction that required hospitalization No Has patient had a PCN reaction occurring within the last 10 years: No If all of the above answers are "NO", then may proceed with Cephalosporin use.     History of Present Illness    Aaron Lucas is a 82 yo male patient of Dr. Percival Lucas. He was admitted back in 04/2015  with volume overload, and was sent for appt to be considered for TAVR. He was evaluated by Dr. Angelena Lucas and Dr. Cyndia Lucas who ultimately felt he would not be a good TAVR candidate given his MR. He underwent a TEE showing his MR was severe and mean gradient across his aortic valves was 42. L/RHC showed moderate stenosis to the mid LAD and ostium of the large diag branch, along with mild stenosis of the Crx and RCA. Last 2D echo in 12/2014 showed EF of 55-60%.   At his last office visit on 11/14/2015  he reported having increased fatigue and moved up his appt. At his appt he reported that his dyspnea had improved and it was felt that he may have been experiencing allergies. He denied any chest discomfort or any new symptoms of PND. He was continued on his home medications with his lasix increased from every other day to daily. Reports he has been feeling better since this change and breathing easier. He reports ambulating around his home without angina or significant dyspnea.   He presented to the [facility] ED with a know left inguinal hernia that was planned to be repaired as an outpatient, but reported increased pain that started on Saturday. He was been seen by surgery who plan to preform bilateral hernia repairs. His pradaxa has been held in preparation for surgery. Cardiology has been consulted in relation to preop clearance for hernia repair.   Inpatient Medications    . furosemide  40 mg Oral BID  . guaiFENesin  600 mg Oral BID  . lisinopril  40 mg Oral Daily  . pantoprazole  40 mg Oral Daily  . sodium chloride flush  3 mL Intravenous Q12H    Family History    Family History  Problem Relation Age of Onset  . Other Father 23     of a PE after surgery  . Cancer - Other Mother   . Other       there is no known coronary artery disease     Social History    Social History   Social History  . Marital status: Married    Spouse name: N/A  . Number of children: 1  . Years of education: N/A   Occupational History  . retired-Magistrate Retired   Social History Main Topics  . Smoking status: Never Smoker  . Smokeless tobacco: Never Used  . Alcohol use Yes     Comment: occassionally  . Drug use: No  . Sexual activity: Not on file   Other Topics Concern  . Not on file   Social History Narrative   [employer]   Exercises 5-6 days a week    Blood type O Positive   Lowe sodium diet     Review of Systems    General:  No chills, fever, night sweats or weight changes.    Cardiovascular: See HPI Dermatological: No rash, lesions/masses Respiratory: Se HPI Urologic: No hematuria, dysuria Abdominal:   No nausea, vomiting, diarrhea, bright red blood per rectum, melena, or hematemesis, +LLQ abd pain Neurologic:  No visual changes, wkns, changes in mental status. All other systems reviewed and are otherwise negative except as noted above.  Physical Exam    Blood pressure (!) 97/58, pulse 80, temperature 97.8 F (36.6 C), resp. rate 18, weight 186 lb 12.8 oz (84.7 kg), SpO2 97 %.  General: Pleasant older male, NAD Psych: Normal affect. Neuro: Alert and oriented X 3. Moves all extremities spontaneously. HEENT: Normal  Neck:  Supple without bruits or JVD. Lungs:  Resp regular and unlabored, CTA. Heart: RRR no s3, s4, 4/6 systolic murmur. Abdomen: Soft, non-tender, non-distended, BS + x 4. Bilateral inguinal hernias Extremities: No clubbing, cyanosis, 1+ bilateral LE edema. DP/PT/Radials 2+ and equal bilaterally.  Labs    Troponin (Point of Care Test) No results for input(s): TROPIPOC in the last 72 hours. No results for input(s): CKTOTAL, CKMB, TROPONINI in the last 72 hours. Lab Results  Component Value Date   WBC 8.0 11/24/2015   HGB 11.5 (L) 11/24/2015   HCT 36.0 (L) 11/24/2015   MCV 85.7 11/24/2015   PLT 197 11/24/2015    Recent Labs Lab 11/22/15 0543  11/25/15 0428  NA 128*  < > 130*  K 4.4  < > 3.8  CL 92*  < > 94*  CO2 24  < > 27  BUN 15  < > 20  CREATININE 0.87  < > 1.01  CALCIUM 9.2  < > 8.8*  PROT 6.4*  --   --   BILITOT 1.4*  --   --   ALKPHOS 75  --   --   ALT 28  --   --   AST 26  --   --   GLUCOSE 117*  < > 90  < > = values in this interval not displayed. No results found for: CHOL, HDL, LDLCALC, TRIG No results found for: [facility]   Radiology Studies    Ct Abdomen Pelvis W Contrast  Result Date: 11/24/2015 CLINICAL DATA:  LEFT lower quadrant abdominal pain radiating to groin intermittently for 4-5 weeks, LEFT inguinal  hernia, history valvular heart disease, atrial fibrillation, prostate cancer, diverticulosis, hypertension EXAM: CT ABDOMEN AND PELVIS WITH CONTRAST TECHNIQUE: Multidetector CT imaging of the abdomen and pelvis was performed using the standard protocol following bolus administration of intravenous contrast. Sagittal and coronal MPR images reconstructed from axial data set. CONTRAST:  117mL ISOVUE-300 IOPAMIDOL (ISOVUE-300) INJECTION 61% IV. Dilute oral contrast. COMPARISON:  07/01/2015 FINDINGS: Lower chest: BILATERAL pleural effusions and minimal compressive atelectasis lower lobes. Fell Hepatobiliary: Suspect tiny dependent calcified gallstone in gallbladder. Liver unremarkable Pancreas: Normal appearance Spleen: Normal appearance Adrenals/Urinary Tract: Adrenal glands normal appearance. Nonobstructing LEFT renal calculus. No renal mass, hydronephrosis or hydroureter. Mild bladder wall thickening question in part related to incomplete distention. Brachytherapy seed implants at prostate bed. Stomach/Bowel: Normal appendix. Segment of nonobstructed sigmoid colon extends into a LEFT inguinal hernia. No associated bowel wall thickening or obstruction. Mild sigmoid diverticulosis. Stomach collapsed, unable to exclude gastric wall thickening. Small bowel loops normal appearance. Vascular/Lymphatic: Atherosclerotic calcifications aorta, iliac arteries, coronary arteries, abdominal branch arteries including the origins of the celiac and superior mesenteric arteries as well as RIGHT renal artery. Heart appears mildly enlarged. No adenopathy. Distended IVC and hepatic veins with reflux of contrast from RIGHT atrium into IVC and hepatic veins, in combination with delayed portal vein visualization raising question bold elevated RIGHT heart pressure and failure. Reproductive: N/A Other: Scattered ascites in abdomen and pelvis as well as within BILATERAL inguinal hernias LEFT larger than RIGHT. Scattered soft tissue edema  diffusely including presacral space terrible question related to hypoproteinemia or anasarca. Musculoskeletal: Osseous demineralization with degenerative disc disease changes lumbar spine. Degenerative changes of both hips. No acute osseous findings. IMPRESSION: BILATERAL pleural effusions and ascites as well as scattered soft tissue edema, could be due to failure normal hyperproteinemia, or anasarca. Suspect tiny calcified gallstone. Nonobstructing LEFT renal calculus. BILATERAL inguinal hernias containing fat and fluid on  RIGHT and containing ascites and nonobstructed sigmoid colon on LEFT. Sigmoid diverticulosis. Aortic atherosclerosis and mild coronary artery calcification with supected elevated RIGHT heart pressure. Electronically Signed   By: Lavonia Dana M.D.   On: 11/24/2015 15:27    ECG & Cardiac Imaging    EKG: 11/14/2015 SR  Echo: 12/20/2014  Study Conclusions  - Left ventricle: The cavity size was mildly dilated. Wall   thickness was increased in a pattern of mild LVH. Systolic   function was normal. The estimated ejection fraction was in the   range of 55% to 60%. Doppler parameters are consistent with   elevated ventricular end-diastolic filling pressure. - Aortic valve: There was severe stenosis. Valve area (VTI): 0.52   cm^2. Valve area (Vmax): 0.45 cm^2. Valve area (Vmean): 0.47   cm^2. - Mitral valve: Severe posterior annular calcification with at   least moderate MR. Possible flail segment to posterior leaflet   Suggest TEE to   evaluate MR in light of likely need for surgery of Aortic Valve.   Calcified annulus. There was moderate regurgitation. - Left atrium: The atrium was severely dilated. - Atrial septum: No defect or patent foramen ovale was identified.  Assessment & Plan    82 yo with PMH of PAF (on pradaxa)/ prostate CA/MR and AS who presented to the [facility] ED with left inguinal hernia, which was planned to be corrected in the outpatient setting, with worsening  pain.    1. Preop clearance: He denies having any angina with daily activity at home. Reports since he saw Dr. Percival Lucas and increased his lasix to 40mg  PO daily instead of every other day, his dyspnea has improved, along with his lower extremity edema. He recently underwent a work for possible TAVR back in 04/2015 with L/RHC that moderate stenosis to the mid LAD and ostium of the large diag branch, along with mild stenosis of the Crx and RCA. 2D echo 2016 showed EF of 55-60%. Given his hx of MR and AS he is considered high risk. He does appear to be volume stable at this time.  -- Currently on Lasix 40mg  PO BID, with stable UOP. Will need volume status to be monitored closely. Last BNP on 11/14/2015 was 1449, but no previous BNP to compare for baseline.  Will obtain preop EKG and repeat BNP for baseline. Will defer clearance to Dr. Radford Pax.   Barnet Pall, NP-C Pager [phone] 11/25/2015, 4:22 PM

## 2015-11-25 NOTE — Progress Notes (Signed)
Subjective: No complaints   Objective: Vital signs in last 24 hours: Temp:  [97.4 F (36.3 C)-97.6 F (36.4 C)] 97.4 F (36.3 C) (08/14 0512) Pulse Rate:  [79-99] 79 (08/14 0512) Resp:  [14-18] 16 (08/14 0512) BP: (104-135)/(62-99) 104/62 (08/14 0512) SpO2:  [95 %-100 %] 98 % (08/13 1919) Weight:  [84.7 kg (186 lb 12.8 oz)] 84.7 kg (186 lb 12.8 oz) (08/14 0512)    Intake/Output from previous day: 08/13 0701 - 08/14 0700 In: -  Out: 650 [Urine:650] Intake/Output this shift: No intake/output data recorded.  GI: abnormal findings:  bilatera reducible inguinal hernia   Lab Results:   Recent Labs  11/24/15 1312  WBC 8.0  HGB 11.5*  HCT 36.0*  PLT 197   BMET  Recent Labs  11/24/15 1312 11/25/15 0428  NA 126* 130*  K 4.2 3.8  CL 95* 94*  CO2 24 27  GLUCOSE 102* 90  BUN 22* 20  CREATININE 0.92 1.01  CALCIUM 8.8* 8.8*   PT/INR No results for input(s): LABPROT, INR in the last 72 hours. ABG No results for input(s): PHART, HCO3 in the last 72 hours.  Invalid input(s): PCO2, PO2  Studies/Results: Ct Abdomen Pelvis W Contrast  Result Date: 11/24/2015 CLINICAL DATA:  LEFT lower quadrant abdominal pain radiating to groin intermittently for 4-5 weeks, LEFT inguinal hernia, history valvular heart disease, atrial fibrillation, prostate cancer, diverticulosis, hypertension EXAM: CT ABDOMEN AND PELVIS WITH CONTRAST TECHNIQUE: Multidetector CT imaging of the abdomen and pelvis was performed using the standard protocol following bolus administration of intravenous contrast. Sagittal and coronal MPR images reconstructed from axial data set. CONTRAST:  169mL ISOVUE-300 IOPAMIDOL (ISOVUE-300) INJECTION 61% IV. Dilute oral contrast. COMPARISON:  07/01/2015 FINDINGS: Lower chest: BILATERAL pleural effusions and minimal compressive atelectasis lower lobes. Fell Hepatobiliary: Suspect tiny dependent calcified gallstone in gallbladder. Liver unremarkable Pancreas: Normal appearance  Spleen: Normal appearance Adrenals/Urinary Tract: Adrenal glands normal appearance. Nonobstructing LEFT renal calculus. No renal mass, hydronephrosis or hydroureter. Mild bladder wall thickening question in part related to incomplete distention. Brachytherapy seed implants at prostate bed. Stomach/Bowel: Normal appendix. Segment of nonobstructed sigmoid colon extends into a LEFT inguinal hernia. No associated bowel wall thickening or obstruction. Mild sigmoid diverticulosis. Stomach collapsed, unable to exclude gastric wall thickening. Small bowel loops normal appearance. Vascular/Lymphatic: Atherosclerotic calcifications aorta, iliac arteries, coronary arteries, abdominal branch arteries including the origins of the celiac and superior mesenteric arteries as well as RIGHT renal artery. Heart appears mildly enlarged. No adenopathy. Distended IVC and hepatic veins with reflux of contrast from RIGHT atrium into IVC and hepatic veins, in combination with delayed portal vein visualization raising question bold elevated RIGHT heart pressure and failure. Reproductive: N/A Other: Scattered ascites in abdomen and pelvis as well as within BILATERAL inguinal hernias LEFT larger than RIGHT. Scattered soft tissue edema diffusely including presacral space terrible question related to hypoproteinemia or anasarca. Musculoskeletal: Osseous demineralization with degenerative disc disease changes lumbar spine. Degenerative changes of both hips. No acute osseous findings. IMPRESSION: BILATERAL pleural effusions and ascites as well as scattered soft tissue edema, could be due to failure normal hyperproteinemia, or anasarca. Suspect tiny calcified gallstone. Nonobstructing LEFT renal calculus. BILATERAL inguinal hernias containing fat and fluid on RIGHT and containing ascites and nonobstructed sigmoid colon on LEFT. Sigmoid diverticulosis. Aortic atherosclerosis and mild coronary artery calcification with supected elevated RIGHT heart  pressure. Electronically Signed   By: Lavonia Dana M.D.   On: 11/24/2015 15:27    Anti-infectives: Anti-infectives    None  Assessment/Plan: Bilateral inguinal hernia with intermittent incarceration on the left Discussed repair of both hernias if cleared by cardiology Once pradaxa has cleared we can proceed Hopefully do it in next 24 - 48 hours The risk of hernia repair include bleeding,  Infection,   Recurrence of the hernia,  Mesh use, chronic pain,  Organ injury,  Bowel injury,  Bladder injury,   nerve injury with numbness around the incision,  Death,  and worsening of preexisting  medical problems.  The alternatives to surgery have been discussed as well..  Long term expectations of both operative and non operative treatments have been discussed.   The patient agrees to proceed.    Myangel Summons A. 11/25/2015

## 2015-11-25 NOTE — Progress Notes (Signed)
[facility] PROGRESS NOTE  HERMAN GOLDFEDER [id number] DOB: [birth year] DOA: 11/24/2015 PCP: Geoffery Lyons, MD  interim summary and HPI 82 y.o. male with a medical history significant for, but not  limited to, PAF, prostate cancer, hypertension. Patient was in the emergency department 2 days ago with left inguinal bulge and discomfort. Patient was seen at [facility] surgery in the emergency department where a left inguinal hernia was reduced and patient discharged with plans for outpatient follow-up. Patient return to emergency department today for recurrent bulge and pain. Patient denies fever, nausea, vomiting and CP.  Assessment/Plan: Left inguinal hernia, reduced two days ago but re-herniated with associated pain. CT scan today does show segment of nonobstructed sigmoid colon extending into the hernia. No bowel wall thickening or obstruction. -Appreciate surgery's assistance. Sounds like patient is scheduled tentatively for surgery on Tuesday or Wednesday if medically stable -continue Holding pradaxa -cardiology call for clearance -high risk for surgery given medical history and age -Patient w/o CP currently and might no need too many further testing prior to surgery. Echo has been ordered  -cautious with IVF's (high risk for decompensation of his CHF)   Severe AS/MR and chronic diastolic HF, not good candidate for TAVR  Last echo Sept 2016 showed estimated ejection fraction was in the range of 55% to 60%, elevated ventricular end-diastolic filling pressure.  -continue diuretics -follow daily wts -strict intake and output -Cardiology consulted; will follow rec's  Hypertension, stable and wellcontrolled -continue home ACEI  -will monitor VS  Atrial fibrillation, rate controlled.  -Chadsvasc 4. On Pradaxa.  -Hold home pradaxa in anticipation for surgery .             Hyponatremia.   -improved/stable -continue diuretics -had adjustment on his lasix for mild  fluid overload approx 1.5 week ago  Abnormal u/a - + WBC -send for culture -no dysuria, no fever, no nausea or vomiting -will hold on abx's for now  Code Status: DNR Family Communication: wife at bedside  Disposition Plan: to be seen by cardiology for clearance; allowing pradaxa to get off his system for inguinal surgery.    Consultants:  [facility]  Cardiology   Procedures:  See below for x-ray reports   Antibiotics:  None   HPI/Subjective: Afebrile, no CP, no SOB. Reports no nausea, no vomiting and no abd pain.  Objective: Vitals:   11/24/15 1919 11/25/15 0512  BP: 122/75 104/62  Pulse: 90 79  Resp: 18 16  Temp: 97.6 F (36.4 C) 97.4 F (36.3 C)    Intake/Output Summary (Last 24 hours) at 11/25/15 1032 Last data filed at 11/24/15 1959  Gross per 24 hour  Intake                0 ml  Output              650 ml  Net             -650 ml   Filed Weights   11/25/15 0512  Weight: 84.7 kg (186 lb 12.8 oz)    Exam:   General:  Afebrile, no CP, reports some intermittent SOB prior to admission; but no SOB here. Denies nausea, vomiting and abd pain currently.  Cardiovascular: rate control, positive SEM, no rubs or gallops  Respiratory: CTA bilaterally, no using accessory muscles, good O2 sat on RA  Abdomen: soft, NT, ND, positive BS  Musculoskeletal: trace edema bilaterally, some venous stasis dermatitis changes, no cyanosis   Data Reviewed: Basic Metabolic Panel:  Recent Labs Lab 11/22/15 0543 11/24/15 1312 11/25/15 0428  NA 128* 126* 130*  K 4.4 4.2 3.8  CL 92* 95* 94*  CO2 24 24 27   GLUCOSE 117* 102* 90  BUN 15 22* 20  CREATININE 0.87 0.92 1.01  CALCIUM 9.2 8.8* 8.8*   Liver Function Tests:  Recent Labs Lab 11/22/15 0543  AST 26  ALT 28  ALKPHOS 75  BILITOT 1.4*  PROT 6.4*  ALBUMIN 3.6   CBC:  Recent Labs Lab 11/22/15 0543 11/24/15 1312  WBC 8.8 8.0  NEUTROABS 6.6 5.8  HGB 11.2* 11.5*  HCT 35.8* 36.0*  MCV 86.5 85.7  PLT 209  197   BNP (last 3 results)  Recent Labs  11/14/15 0853  BNP 1,449.9*    Studies: Ct Abdomen Pelvis W Contrast  Result Date: 11/24/2015 CLINICAL DATA:  LEFT lower quadrant abdominal pain radiating to groin intermittently for 4-5 weeks, LEFT inguinal hernia, history valvular heart disease, atrial fibrillation, prostate cancer, diverticulosis, hypertension EXAM: CT ABDOMEN AND PELVIS WITH CONTRAST TECHNIQUE: Multidetector CT imaging of the abdomen and pelvis was performed using the standard protocol following bolus administration of intravenous contrast. Sagittal and coronal MPR images reconstructed from axial data set. CONTRAST:  158mL ISOVUE-300 IOPAMIDOL (ISOVUE-300) INJECTION 61% IV. Dilute oral contrast. COMPARISON:  07/01/2015 FINDINGS: Lower chest: BILATERAL pleural effusions and minimal compressive atelectasis lower lobes. Fell Hepatobiliary: Suspect tiny dependent calcified gallstone in gallbladder. Liver unremarkable Pancreas: Normal appearance Spleen: Normal appearance Adrenals/Urinary Tract: Adrenal glands normal appearance. Nonobstructing LEFT renal calculus. No renal mass, hydronephrosis or hydroureter. Mild bladder wall thickening question in part related to incomplete distention. Brachytherapy seed implants at prostate bed. Stomach/Bowel: Normal appendix. Segment of nonobstructed sigmoid colon extends into a LEFT inguinal hernia. No associated bowel wall thickening or obstruction. Mild sigmoid diverticulosis. Stomach collapsed, unable to exclude gastric wall thickening. Small bowel loops normal appearance. Vascular/Lymphatic: Atherosclerotic calcifications aorta, iliac arteries, coronary arteries, abdominal branch arteries including the origins of the celiac and superior mesenteric arteries as well as RIGHT renal artery. Heart appears mildly enlarged. No adenopathy. Distended IVC and hepatic veins with reflux of contrast from RIGHT atrium into IVC and hepatic veins, in combination with  delayed portal vein visualization raising question bold elevated RIGHT heart pressure and failure. Reproductive: N/A Other: Scattered ascites in abdomen and pelvis as well as within BILATERAL inguinal hernias LEFT larger than RIGHT. Scattered soft tissue edema diffusely including presacral space terrible question related to hypoproteinemia or anasarca. Musculoskeletal: Osseous demineralization with degenerative disc disease changes lumbar spine. Degenerative changes of both hips. No acute osseous findings. IMPRESSION: BILATERAL pleural effusions and ascites as well as scattered soft tissue edema, could be due to failure normal hyperproteinemia, or anasarca. Suspect tiny calcified gallstone. Nonobstructing LEFT renal calculus. BILATERAL inguinal hernias containing fat and fluid on RIGHT and containing ascites and nonobstructed sigmoid colon on LEFT. Sigmoid diverticulosis. Aortic atherosclerosis and mild coronary artery calcification with supected elevated RIGHT heart pressure. Electronically Signed   By: Lavonia Dana M.D.   On: 11/24/2015 15:27    Scheduled Meds: . furosemide  40 mg Oral BID  . guaiFENesin  600 mg Oral BID  . lisinopril  40 mg Oral Daily  . pantoprazole  40 mg Oral Daily  . sodium chloride flush  3 mL Intravenous Q12H   Continuous Infusions: . sodium chloride 20 mL/hr at 11/24/15 1317    Active Problems:   Hypertension   Paroxysmal atrial fibrillation (HCC)   Aortic stenosis   Inguinal hernia,  left   Hyponatremia   Abnormal urinalysis    Time spent: 30 minutes     [name]  [facility] Pager [phone]. If 7PM-7AM, please contact night-coverage at [email or web address], password [facility] 11/25/2015, 10:32 AM  LOS: 1 day

## 2015-11-26 ENCOUNTER — Observation Stay (HOSPITAL_BASED_OUTPATIENT_CLINIC_OR_DEPARTMENT_OTHER): Payer: Medicare Other

## 2015-11-26 DIAGNOSIS — I480 Paroxysmal atrial fibrillation: Secondary | ICD-10-CM

## 2015-11-26 DIAGNOSIS — E871 Hypo-osmolality and hyponatremia: Secondary | ICD-10-CM | POA: Diagnosis not present

## 2015-11-26 DIAGNOSIS — I10 Essential (primary) hypertension: Secondary | ICD-10-CM

## 2015-11-26 DIAGNOSIS — I509 Heart failure, unspecified: Secondary | ICD-10-CM | POA: Diagnosis not present

## 2015-11-26 DIAGNOSIS — I350 Nonrheumatic aortic (valve) stenosis: Secondary | ICD-10-CM

## 2015-11-26 DIAGNOSIS — I5043 Acute on chronic combined systolic (congestive) and diastolic (congestive) heart failure: Secondary | ICD-10-CM | POA: Diagnosis not present

## 2015-11-26 DIAGNOSIS — K4090 Unilateral inguinal hernia, without obstruction or gangrene, not specified as recurrent: Secondary | ICD-10-CM | POA: Diagnosis not present

## 2015-11-26 DIAGNOSIS — I5033 Acute on chronic diastolic (congestive) heart failure: Secondary | ICD-10-CM

## 2015-11-26 LAB — BASIC METABOLIC PANEL
Anion gap: 9 (ref 5–15)
BUN: 19 mg/dL (ref 6–20)
CALCIUM: 8.6 mg/dL — AB (ref 8.9–10.3)
CO2: 25 mmol/L (ref 22–32)
CREATININE: 0.87 mg/dL (ref 0.61–1.24)
Chloride: 97 mmol/L — ABNORMAL LOW (ref 101–111)
Glucose, Bld: 95 mg/dL (ref 65–99)
Potassium: 3.6 mmol/L (ref 3.5–5.1)
Sodium: 131 mmol/L — ABNORMAL LOW (ref 135–145)

## 2015-11-26 LAB — ECHOCARDIOGRAM COMPLETE: Weight: 2825.6 oz

## 2015-11-26 MED ORDER — FUROSEMIDE 10 MG/ML IJ SOLN
20.0000 mg | Freq: Two times a day (BID) | INTRAMUSCULAR | Status: DC
  Administered 2015-11-26 – 2015-11-30 (×9): 20 mg via INTRAVENOUS
  Filled 2015-11-26 (×10): qty 2

## 2015-11-26 NOTE — Progress Notes (Addendum)
SUBJECTIVE:  No complaints  OBJECTIVE:   Vitals:   Vitals:   11/25/15 0512 11/25/15 1346 11/25/15 2222 11/26/15 0527  BP: 104/62 (!) 97/58 102/64 (!) 104/58  Pulse: 79 80 78 80  Resp: 16 18 18 18   Temp: 97.4 F (36.3 C) 97.8 F (36.6 C) 97.8 F (36.6 C) 98 F (36.7 C)  TempSrc: Oral  Oral Oral  SpO2:  97% 96% 97%  Weight: 186 lb 12.8 oz (84.7 kg)   176 lb 9.6 oz (80.1 kg)   I&O's:   Intake/Output Summary (Last 24 hours) at 11/26/15 0810 Last data filed at 11/25/15 1500  Gross per 24 hour  Intake           754.33 ml  Output                0 ml  Net           754.33 ml   TELEMETRY: Reviewed telemetry pt in NSR:     PHYSICAL EXAM General: Well developed, well nourished, in no acute distress Head: Eyes PERRLA, No xanthomas.   Normal cephalic and atramatic  Lungs:   Clear bilaterally to auscultation and percussion. Heart:   HRRR S1 S2 Pulses are 2+ & equal. 2/6 SM at RUSB to LLSB late peaking            No carotid bruit. No JVD.  No abdominal bruits. No femoral bruits. Abdomen: Bowel sounds are positive, abdomen soft and non-tender without masses Msk:  Back normal, normal gait. Normal strength and tone for age. Extremities:   No clubbing, cyanosis or edema.  DP +1 Neuro: Alert and oriented X 3. Psych:  Good affect, responds appropriately   LABS: Basic Metabolic Panel:  Recent Labs  11/25/15 0428 11/26/15 0551  NA 130* 131*  K 3.8 3.6  CL 94* 97*  CO2 27 25  GLUCOSE 90 95  BUN 20 19  CREATININE 1.01 0.87  CALCIUM 8.8* 8.6*   Liver Function Tests: No results for input(s): AST, ALT, ALKPHOS, BILITOT, PROT, ALBUMIN in the last 72 hours. No results for input(s): LIPASE, AMYLASE in the last 72 hours. CBC:  Recent Labs  11/24/15 1312  WBC 8.0  NEUTROABS 5.8  HGB 11.5*  HCT 36.0*  MCV 85.7  PLT 197   Cardiac Enzymes: No results for input(s): CKTOTAL, CKMB, CKMBINDEX, TROPONINI in the last 72 hours. BNP: Invalid input(s): POCBNP D-Dimer: No  results for input(s): DDIMER in the last 72 hours. Hemoglobin A1C: No results for input(s): HGBA1C in the last 72 hours. Fasting Lipid Panel: No results for input(s): CHOL, HDL, LDLCALC, TRIG, CHOLHDL, LDLDIRECT in the last 72 hours. Thyroid Function Tests: No results for input(s): TSH, T4TOTAL, T3FREE, THYROIDAB in the last 72 hours.  Invalid input(s): FREET3 Anemia Panel: No results for input(s): VITAMINB12, FOLATE, FERRITIN, TIBC, IRON, RETICCTPCT in the last 72 hours. Coag Panel:   Lab Results  Component Value Date   INR 1.63 (H) 04/16/2015   INR 1.41 11/13/2014   INR 1.54 (H) 11/12/2014    RADIOLOGY: Ct Abdomen Pelvis W Contrast  Result Date: 11/24/2015 CLINICAL DATA:  LEFT lower quadrant abdominal pain radiating to groin intermittently for 4-5 weeks, LEFT inguinal hernia, history valvular heart disease, atrial fibrillation, prostate cancer, diverticulosis, hypertension EXAM: CT ABDOMEN AND PELVIS WITH CONTRAST TECHNIQUE: Multidetector CT imaging of the abdomen and pelvis was performed using the standard protocol following bolus administration of intravenous contrast. Sagittal and coronal MPR images reconstructed from axial data set.  CONTRAST:  152mL ISOVUE-300 IOPAMIDOL (ISOVUE-300) INJECTION 61% IV. Dilute oral contrast. COMPARISON:  07/01/2015 FINDINGS: Lower chest: BILATERAL pleural effusions and minimal compressive atelectasis lower lobes. Fell Hepatobiliary: Suspect tiny dependent calcified gallstone in gallbladder. Liver unremarkable Pancreas: Normal appearance Spleen: Normal appearance Adrenals/Urinary Tract: Adrenal glands normal appearance. Nonobstructing LEFT renal calculus. No renal mass, hydronephrosis or hydroureter. Mild bladder wall thickening question in part related to incomplete distention. Brachytherapy seed implants at prostate bed. Stomach/Bowel: Normal appendix. Segment of nonobstructed sigmoid colon extends into a LEFT inguinal hernia. No associated bowel wall  thickening or obstruction. Mild sigmoid diverticulosis. Stomach collapsed, unable to exclude gastric wall thickening. Small bowel loops normal appearance. Vascular/Lymphatic: Atherosclerotic calcifications aorta, iliac arteries, coronary arteries, abdominal branch arteries including the origins of the celiac and superior mesenteric arteries as well as RIGHT renal artery. Heart appears mildly enlarged. No adenopathy. Distended IVC and hepatic veins with reflux of contrast from RIGHT atrium into IVC and hepatic veins, in combination with delayed portal vein visualization raising question bold elevated RIGHT heart pressure and failure. Reproductive: N/A Other: Scattered ascites in abdomen and pelvis as well as within BILATERAL inguinal hernias LEFT larger than RIGHT. Scattered soft tissue edema diffusely including presacral space terrible question related to hypoproteinemia or anasarca. Musculoskeletal: Osseous demineralization with degenerative disc disease changes lumbar spine. Degenerative changes of both hips. No acute osseous findings. IMPRESSION: BILATERAL pleural effusions and ascites as well as scattered soft tissue edema, could be due to failure normal hyperproteinemia, or anasarca. Suspect tiny calcified gallstone. Nonobstructing LEFT renal calculus. BILATERAL inguinal hernias containing fat and fluid on RIGHT and containing ascites and nonobstructed sigmoid colon on LEFT. Sigmoid diverticulosis. Aortic atherosclerosis and mild coronary artery calcification with supected elevated RIGHT heart pressure. Electronically Signed   By: Lavonia Dana M.D.   On: 11/24/2015 15:27    Assessment & Plan    82 yo with PMH of PAF (on pradaxa)/ prostate CA/MR and AS who presented to the [facility] ED with left inguinal hernia, which was planned to be corrected in the outpatient setting, with worsening pain.    1. Preop clearance: He denies having any angina with daily activity at home. Reports since he saw Dr. Percival Spanish  and increased his lasix to 40mg  PO daily instead of every other day, his dyspnea has improved, along with his lower extremity edema. He is very high risk from a cardiac standpoint given his severe AS and CHF but if his hernias become incarcerated and he becomes septic his risk will be even higher. I will discuss with colleagues whether he would be a candidate for aortic valve valvuloplasty to lower surgical risk but unlikely.  He has some CHF currently and needs to be diuresed prior to surgyer.  Need to avoid hypotension during the surgery and use IVF cautiously to avoid further CHF.  His Pradaxa for PAF is on hold.  Will follow with you.   2.  Severe AS - not a candidate for conventional AVR and turned down for TAVR due to severe MR.   3.  Acute on chronic diastolic CHF -  He has baseline SOB with ambulation and mild LE edema.  His BNP is elevated more than a few days ago and he has increased LE edema.  He needs to be diuresed more prior to surgery to avoid worsening CHF in the peri - op period.  Will need gentle diuresis due to soft BP and need to avoid hypotension.  I will hold his ACE I for  now to allow more BP room for diuresis.  4.  PAF - maintaining NSR.  Pradaxa on hold for surgery.    Fransico Him, MD  11/26/2015  8:10 AM

## 2015-11-26 NOTE — Progress Notes (Signed)
Echocardiogram 2D Echocardiogram has been performed.  Aaron Lucas 11/26/2015, 1:01 PM

## 2015-11-26 NOTE — Progress Notes (Signed)
Patient admitted to room 5N30 accompanied by wife.  He is alert and oriented x 4 and aware of diagnosis.  Oriented to staff, room, and to unit routine.  Welcome packet given and bed placed in low position and locked.  Patient advised to call for assistance and deemed a high  fall risk due to poor eyesight, age, and gait disturbance / requiring a cane for ambulation.

## 2015-11-26 NOTE — Progress Notes (Signed)
Subjective: No complaints   Objective: Vital signs in last 24 hours: Temp:  [97.8 F (36.6 C)-98 F (36.7 C)] 98 F (36.7 C) (08/15 0527) Pulse Rate:  [78-80] 80 (08/15 0527) Resp:  [18] 18 (08/15 0527) BP: (97-104)/(58-64) 104/58 (08/15 0527) SpO2:  [96 %-97 %] 97 % (08/15 0527) Weight:  [80.1 kg (176 lb 9.6 oz)] 80.1 kg (176 lb 9.6 oz) (08/15 0527) Last BM Date: 11/24/15  Intake/Output from previous day: 08/14 0701 - 08/15 0700 In: 754.3 [P.O.:240; I.V.:514.3] Out: -  Intake/Output this shift: No intake/output data recorded.  GI: BIH reduced   Lab Results:   Recent Labs  11/24/15 1312  WBC 8.0  HGB 11.5*  HCT 36.0*  PLT 197   BMET  Recent Labs  11/25/15 0428 11/26/15 0551  NA 130* 131*  K 3.8 3.6  CL 94* 97*  CO2 27 25  GLUCOSE 90 95  BUN 20 19  CREATININE 1.01 0.87  CALCIUM 8.8* 8.6*   PT/INR No results for input(s): LABPROT, INR in the last 72 hours. ABG No results for input(s): PHART, HCO3 in the last 72 hours.  Invalid input(s): PCO2, PO2  Studies/Results: Ct Abdomen Pelvis W Contrast  Result Date: 11/24/2015 CLINICAL DATA:  LEFT lower quadrant abdominal pain radiating to groin intermittently for 4-5 weeks, LEFT inguinal hernia, history valvular heart disease, atrial fibrillation, prostate cancer, diverticulosis, hypertension EXAM: CT ABDOMEN AND PELVIS WITH CONTRAST TECHNIQUE: Multidetector CT imaging of the abdomen and pelvis was performed using the standard protocol following bolus administration of intravenous contrast. Sagittal and coronal MPR images reconstructed from axial data set. CONTRAST:  131mL ISOVUE-300 IOPAMIDOL (ISOVUE-300) INJECTION 61% IV. Dilute oral contrast. COMPARISON:  07/01/2015 FINDINGS: Lower chest: BILATERAL pleural effusions and minimal compressive atelectasis lower lobes. Fell Hepatobiliary: Suspect tiny dependent calcified gallstone in gallbladder. Liver unremarkable Pancreas: Normal appearance Spleen: Normal  appearance Adrenals/Urinary Tract: Adrenal glands normal appearance. Nonobstructing LEFT renal calculus. No renal mass, hydronephrosis or hydroureter. Mild bladder wall thickening question in part related to incomplete distention. Brachytherapy seed implants at prostate bed. Stomach/Bowel: Normal appendix. Segment of nonobstructed sigmoid colon extends into a LEFT inguinal hernia. No associated bowel wall thickening or obstruction. Mild sigmoid diverticulosis. Stomach collapsed, unable to exclude gastric wall thickening. Small bowel loops normal appearance. Vascular/Lymphatic: Atherosclerotic calcifications aorta, iliac arteries, coronary arteries, abdominal branch arteries including the origins of the celiac and superior mesenteric arteries as well as RIGHT renal artery. Heart appears mildly enlarged. No adenopathy. Distended IVC and hepatic veins with reflux of contrast from RIGHT atrium into IVC and hepatic veins, in combination with delayed portal vein visualization raising question bold elevated RIGHT heart pressure and failure. Reproductive: N/A Other: Scattered ascites in abdomen and pelvis as well as within BILATERAL inguinal hernias LEFT larger than RIGHT. Scattered soft tissue edema diffusely including presacral space terrible question related to hypoproteinemia or anasarca. Musculoskeletal: Osseous demineralization with degenerative disc disease changes lumbar spine. Degenerative changes of both hips. No acute osseous findings. IMPRESSION: BILATERAL pleural effusions and ascites as well as scattered soft tissue edema, could be due to failure normal hyperproteinemia, or anasarca. Suspect tiny calcified gallstone. Nonobstructing LEFT renal calculus. BILATERAL inguinal hernias containing fat and fluid on RIGHT and containing ascites and nonobstructed sigmoid colon on LEFT. Sigmoid diverticulosis. Aortic atherosclerosis and mild coronary artery calcification with supected elevated RIGHT heart pressure.  Electronically Signed   By: Lavonia Dana M.D.   On: 11/24/2015 15:27    Anti-infectives: Anti-infectives    None  Assessment/Plan: BILATERAL INGUINAL HERNIA CHF Significant risk for surgery   Reviewed cardiology recommendations   Very high operative risk Discussed the use of trus vs surgery   BNP is very high He will think over his situation and decide tomorrow Ok to feed    LOS: 1 day    Hallelujah Wysong A. 11/26/2015

## 2015-11-26 NOTE — Progress Notes (Addendum)
[facility] PROGRESS NOTE  Aaron Lucas [id number] DOB: 1928/10/18 DOA: 11/24/2015 PCP: Geoffery Lyons, MD  interim summary and HPI 82 y.o. male with a medical history significant for, but not  limited to, PAF, prostate cancer, hypertension. Patient was in the emergency department 2 days ago with left inguinal bulge and discomfort. Patient was seen at [facility] surgery in the emergency department where a left inguinal hernia was reduced and patient discharged with plans for outpatient follow-up. Patient return to emergency department today for recurrent bulge and pain. Patient denies fever, nausea, vomiting and CP.  Assessment/Plan: Left inguinal hernia, reduced two days ago but re-herniated with associated pain. CT scan today does show segment of non-obstructed sigmoid colon extending into the hernia. No bowel wall thickening or obstruction seen on image studies. -Appreciate surgery's assistance.   -continue Holding pradaxa -high risk for surgery given medical history and age. Will follow cardiology further decisions for clearance. -Patient w/o CP currently and might no need too many further testing prior to surgery. Echo has been ordered and pending. -cautious with IVF's during surgery (high risk for decompensation of his CHF)   Severe AS/MR and mild acute on chronic diastolic HF, not good candidate for TAVR  Last echo Sept 2016 showed estimated ejection fraction was in the range of 55% to 60%, elevated ventricular end-diastolic filling pressure.  -continue diuretics as dictated by cardiology -follow daily wts and strict intake and output -continue low sodium diet -BNP elevated and decision was made to diuresed him a little more aggressive -echo pending  Hypertension, BP stable, but soft currently -will hold home ACEI  -will monitor VS -continue diuresis as recommended by cardiology   Atrial fibrillation, rate controlled.  -Chadsvasc 4. On Pradaxa.  -Hold home  pradaxa in anticipation for surgery .             Hyponatremia.   -improved/stable -continue diuretics -had adjustment on his lasix for mild fluid overload approx 1.5 week ago  Abnormal u/a - + WBC -send for culture -no dysuria, no fever, no nausea or vomiting -will hold on abx's for now  Code Status: DNR Family Communication: wife at bedside  Disposition Plan: seen by cardiology for clearance (high risk for surgery); echo pending; allowing pradaxa to get off his system for potential inguinal surgery.    Consultants:  [facility]  Cardiology   Procedures:  See below for x-ray reports   2-D echo: pending  Antibiotics:  None   HPI/Subjective: Afebrile, no CP, no SOB. Reports no nausea, no vomiting and no abd pain. Patient BNP elevated and with some edema on his LE's  Objective: Vitals:   11/26/15 1019 11/26/15 1445  BP: 111/88 100/62  Pulse:    Resp: 18 (!) 79  Temp: 98.2 F (36.8 C) 97.9 F (36.6 C)    Intake/Output Summary (Last 24 hours) at 11/26/15 1833 Last data filed at 11/26/15 1336  Gross per 24 hour  Intake                0 ml  Output             1150 ml  Net            -1150 ml   Filed Weights   11/25/15 0512 11/26/15 0527  Weight: 84.7 kg (186 lb 12.8 oz) 80.1 kg (176 lb 9.6 oz)    Exam:   General:  Afebrile, no CP, reports some intermittent SOB prior to admission; but no SOB currently. Denies  nausea, vomiting and abd pain currently.  Cardiovascular: rate control, positive SEM, no rubs or gallops  Respiratory: CTA bilaterally, no using accessory muscles, good O2 sat on RA  Abdomen: soft, NT, ND, positive BS  Musculoskeletal: trace to 1 + edema bilaterally, some venous stasis dermatitis changes, no cyanosis   Data Reviewed: Basic Metabolic Panel:  Recent Labs Lab 11/22/15 0543 11/24/15 1312 11/25/15 0428 11/26/15 0551  NA 128* 126* 130* 131*  K 4.4 4.2 3.8 3.6  CL 92* 95* 94* 97*  CO2 24 24 27 25   GLUCOSE 117* 102* 90 95  BUN 15  22* 20 19  CREATININE 0.87 0.92 1.01 0.87  CALCIUM 9.2 8.8* 8.8* 8.6*   Liver Function Tests:  Recent Labs Lab 11/22/15 0543  AST 26  ALT 28  ALKPHOS 75  BILITOT 1.4*  PROT 6.4*  ALBUMIN 3.6   CBC:  Recent Labs Lab 11/22/15 0543 11/24/15 1312  WBC 8.8 8.0  NEUTROABS 6.6 5.8  HGB 11.2* 11.5*  HCT 35.8* 36.0*  MCV 86.5 85.7  PLT 209 197   BNP (last 3 results)  Recent Labs  11/14/15 0853 11/25/15 1816  BNP 1,449.9* 1,831.5*    Studies: No results found.  Scheduled Meds: . furosemide  20 mg Intravenous BID  . guaiFENesin  600 mg Oral BID  . pantoprazole  40 mg Oral Daily  . sodium chloride flush  3 mL Intravenous Q12H   Continuous Infusions: . sodium chloride 20 mL/hr at 11/24/15 1317    Active Problems:   Hypertension   Paroxysmal atrial fibrillation (HCC)   Aortic stenosis   Inguinal hernia, left   Hyponatremia   Abnormal urinalysis    Time spent: 30 minutes     [name]  [facility] Pager [phone]. If 7PM-7AM, please contact night-coverage at [email or web address], password [email or web address] 11/26/2015, 6:33 PM  LOS: 1 day

## 2015-11-27 DIAGNOSIS — I4891 Unspecified atrial fibrillation: Secondary | ICD-10-CM

## 2015-11-27 DIAGNOSIS — I5043 Acute on chronic combined systolic (congestive) and diastolic (congestive) heart failure: Secondary | ICD-10-CM | POA: Diagnosis not present

## 2015-11-27 DIAGNOSIS — E871 Hypo-osmolality and hyponatremia: Secondary | ICD-10-CM | POA: Diagnosis not present

## 2015-11-27 DIAGNOSIS — I480 Paroxysmal atrial fibrillation: Secondary | ICD-10-CM | POA: Diagnosis not present

## 2015-11-27 DIAGNOSIS — K4090 Unilateral inguinal hernia, without obstruction or gangrene, not specified as recurrent: Secondary | ICD-10-CM | POA: Diagnosis not present

## 2015-11-27 DIAGNOSIS — I10 Essential (primary) hypertension: Secondary | ICD-10-CM | POA: Diagnosis not present

## 2015-11-27 DIAGNOSIS — I350 Nonrheumatic aortic (valve) stenosis: Secondary | ICD-10-CM | POA: Diagnosis not present

## 2015-11-27 LAB — BASIC METABOLIC PANEL
Anion gap: 11 (ref 5–15)
BUN: 19 mg/dL (ref 6–20)
CALCIUM: 9 mg/dL (ref 8.9–10.3)
CO2: 28 mmol/L (ref 22–32)
Chloride: 94 mmol/L — ABNORMAL LOW (ref 101–111)
Creatinine, Ser: 0.93 mg/dL (ref 0.61–1.24)
GFR calc Af Amer: >60 mL/min (ref >60)
GFR calc non Af Amer: >60 mL/min (ref >60)
Glucose, Bld: 101 mg/dL — ABNORMAL HIGH (ref 65–99)
Potassium: 3.9 mmol/L (ref 3.5–5.1)
SODIUM: 133 mmol/L — AB (ref 135–145)

## 2015-11-27 MED ORDER — CLINDAMYCIN PHOSPHATE 300 MG/50ML IV SOLN
300.0000 mg | INTRAVENOUS | Status: AC
  Administered 2015-11-28: 300 mg via INTRAVENOUS
  Filled 2015-11-27 (×2): qty 50

## 2015-11-27 MED ORDER — CHLORHEXIDINE GLUCONATE CLOTH 2 % EX PADS: 6.0000 | MEDICATED_PAD | Freq: Once | CUTANEOUS | Status: AC

## 2015-11-27 MED ORDER — CLINDAMYCIN PHOSPHATE 300 MG/50ML IV SOLN: 300.0000 mg | INTRAVENOUS | Status: DC

## 2015-11-27 MED ORDER — CHLORHEXIDINE GLUCONATE CLOTH 2 % EX PADS
6.0000 | MEDICATED_PAD | Freq: Once | CUTANEOUS | Status: AC
  Administered 2015-11-27: 6 via TOPICAL

## 2015-11-27 NOTE — Progress Notes (Signed)
Patient ID: Aaron Lucas, male   DOB: 1928-05-29, 82 y.o.   MRN: [id number]   LOS: 1 day   Subjective: Doing fine today, would like to proceed with surgery.   Objective: Vital signs in last 24 hours: Temp:  [97.8 F (36.6 C)-98 F (36.7 C)] 97.8 F (36.6 C) (08/16 0452) Pulse Rate:  [82-85] 85 (08/16 0452) Resp:  [16-79] 16 (08/16 0452) BP: (100-112)/(56-74) 109/56 (08/16 0452) SpO2:  [96 %-99 %] 96 % (08/16 0452) Weight:  [80.9 kg (178 lb 6.4 oz)-81.3 kg (179 lb 4.8 oz)] 80.9 kg (178 lb 6.4 oz) (08/16 0700) Last BM Date: 11/24/15   Laboratory  CBC  Recent Labs  11/24/15 1312  WBC 8.0  HGB 11.5*  HCT 36.0*  PLT 197   BMET  Recent Labs  11/26/15 0551 11/27/15 0737  NA 131* 133*  K 3.6 3.9  CL 97* 94*  CO2 25 28  GLUCOSE 95 101*  BUN 19 19  CREATININE 0.87 0.93  CALCIUM 8.6* 9.0    Physical Exam General appearance: alert and no distress Resp: clear to auscultation bilaterally Cardio: regular rate and rhythm GI: normal findings: bowel sounds normal and soft, non-tender   Assessment/Plan: Bilateral inguinal hernias -- Pt would like to proceed with surgery. Will make NPO tonight and plan on OR tomorrow. Pt and family understand surgery is high risk. Dr. Brantley Stage to discuss specifics with him later today.    Lisette Abu, PA-C Pager: [phone] 11/27/2015

## 2015-11-27 NOTE — Progress Notes (Signed)
SUBJECTIVE:  No complaints  OBJECTIVE:   Vitals:   Vitals:   11/26/15 1445 11/26/15 2202 11/27/15 0452 11/27/15 0700  BP: 100/62 112/74 (!) 109/56   Pulse:  82 85   Resp: (!) 79 16 16   Temp: 97.9 F (36.6 C) 98 F (36.7 C) 97.8 F (36.6 C)   TempSrc:  Oral Oral   SpO2: 99% 98% 96%   Weight:   179 lb 4.8 oz (81.3 kg) 178 lb 6.4 oz (80.9 kg)   I&O's:   Intake/Output Summary (Last 24 hours) at 11/27/15 [id number] Last data filed at 11/27/15 0900  Gross per 24 hour  Intake              480 ml  Output             2575 ml  Net            -2095 ml   TELEMETRY: Reviewed telemetry pt in NSR:     PHYSICAL EXAM General: Well developed, well nourished, in no acute distress Head: Eyes PERRLA, No xanthomas.   Normal cephalic and atramatic  Lungs:   Clear bilaterally to auscultation and percussion. Heart:   HRRR S1 S2 Pulses are 2+ & equal.  2/6 late peaking SM at RUSB to LLSB Abdomen: Bowel sounds are positive, abdomen soft and non-tender without masses Msk:  Back normal, normal gait. Normal strength and tone for age. Extremities:   No clubbing, cyanosis or edema.  DP +1 Neuro: Alert and oriented X 3. Psych:  Good affect, responds appropriately   LABS: Basic Metabolic Panel:  Recent Labs  11/26/15 0551 11/27/15 0737  NA 131* 133*  K 3.6 3.9  CL 97* 94*  CO2 25 28  GLUCOSE 95 101*  BUN 19 19  CREATININE 0.87 0.93  CALCIUM 8.6* 9.0   Liver Function Tests: No results for input(s): AST, ALT, ALKPHOS, BILITOT, PROT, ALBUMIN in the last 72 hours. No results for input(s): LIPASE, AMYLASE in the last 72 hours. CBC:  Recent Labs  11/24/15 1312  WBC 8.0  NEUTROABS 5.8  HGB 11.5*  HCT 36.0*  MCV 85.7  PLT 197   Cardiac Enzymes: No results for input(s): CKTOTAL, CKMB, CKMBINDEX, TROPONINI in the last 72 hours. BNP: Invalid input(s): POCBNP D-Dimer: No results for input(s): DDIMER in the last 72 hours. Hemoglobin A1C: No results for input(s): HGBA1C in the last 72  hours. Fasting Lipid Panel: No results for input(s): CHOL, HDL, LDLCALC, TRIG, CHOLHDL, LDLDIRECT in the last 72 hours. Thyroid Function Tests: No results for input(s): TSH, T4TOTAL, T3FREE, THYROIDAB in the last 72 hours.  Invalid input(s): FREET3 Anemia Panel: No results for input(s): VITAMINB12, FOLATE, FERRITIN, TIBC, IRON, RETICCTPCT in the last 72 hours. Coag Panel:   Lab Results  Component Value Date   INR 1.63 (H) 04/16/2015   INR 1.41 11/13/2014   INR 1.54 (H) 11/12/2014    RADIOLOGY: Ct Abdomen Pelvis W Contrast  Result Date: 11/24/2015 CLINICAL DATA:  LEFT lower quadrant abdominal pain radiating to groin intermittently for 4-5 weeks, LEFT inguinal hernia, history valvular heart disease, atrial fibrillation, prostate cancer, diverticulosis, hypertension EXAM: CT ABDOMEN AND PELVIS WITH CONTRAST TECHNIQUE: Multidetector CT imaging of the abdomen and pelvis was performed using the standard protocol following bolus administration of intravenous contrast. Sagittal and coronal MPR images reconstructed from axial data set. CONTRAST:  139mL ISOVUE-300 IOPAMIDOL (ISOVUE-300) INJECTION 61% IV. Dilute oral contrast. COMPARISON:  07/01/2015 FINDINGS: Lower chest: BILATERAL pleural effusions and minimal compressive  atelectasis lower lobes. Fell Hepatobiliary: Suspect tiny dependent calcified gallstone in gallbladder. Liver unremarkable Pancreas: Normal appearance Spleen: Normal appearance Adrenals/Urinary Tract: Adrenal glands normal appearance. Nonobstructing LEFT renal calculus. No renal mass, hydronephrosis or hydroureter. Mild bladder wall thickening question in part related to incomplete distention. Brachytherapy seed implants at prostate bed. Stomach/Bowel: Normal appendix. Segment of nonobstructed sigmoid colon extends into a LEFT inguinal hernia. No associated bowel wall thickening or obstruction. Mild sigmoid diverticulosis. Stomach collapsed, unable to exclude gastric wall thickening.  Small bowel loops normal appearance. Vascular/Lymphatic: Atherosclerotic calcifications aorta, iliac arteries, coronary arteries, abdominal branch arteries including the origins of the celiac and superior mesenteric arteries as well as RIGHT renal artery. Heart appears mildly enlarged. No adenopathy. Distended IVC and hepatic veins with reflux of contrast from RIGHT atrium into IVC and hepatic veins, in combination with delayed portal vein visualization raising question bold elevated RIGHT heart pressure and failure. Reproductive: N/A Other: Scattered ascites in abdomen and pelvis as well as within BILATERAL inguinal hernias LEFT larger than RIGHT. Scattered soft tissue edema diffusely including presacral space terrible question related to hypoproteinemia or anasarca. Musculoskeletal: Osseous demineralization with degenerative disc disease changes lumbar spine. Degenerative changes of both hips. No acute osseous findings. IMPRESSION: BILATERAL pleural effusions and ascites as well as scattered soft tissue edema, could be due to failure normal hyperproteinemia, or anasarca. Suspect tiny calcified gallstone. Nonobstructing LEFT renal calculus. BILATERAL inguinal hernias containing fat and fluid on RIGHT and containing ascites and nonobstructed sigmoid colon on LEFT. Sigmoid diverticulosis. Aortic atherosclerosis and mild coronary artery calcification with supected elevated RIGHT heart pressure. Electronically Signed   By: Lavonia Dana M.D.   On: 11/24/2015 15:27   Assessment & Plan   82 yo with PMH of PAF (on pradaxa)/ prostate CA/MR and AS who presented to the [facility] ED with left inguinal hernia, which was planned to be corrected in the outpatient setting, with worsening pain.   1. Preop clearance:He denies having any angina with daily activity at home. Reports since he saw Dr. Percival Spanish and increased his lasix to 40mg  PO daily instead of every other day, his dyspnea has improved, along with his lower  extremity edema. He is very high risk from a cardiac standpoint given his severe AS and CHF but if his hernias become incarcerated and he becomes septic his risk will be even higher. I discussed with colleagues and he is not a candidate for palliative aortic valve valvuloplasty due to severe MR.  He has some CHF currently and needs to be diuresed prior to surgyer.  Need to avoid hypotension during the surgery and use IVF cautiously to avoid further CHF. His Pradaxa for PAF is on hold.  I have had a long talk with his son and with the patient and his wife concerning the cardiac risks of proceeding with surgery.  They understand that he is high risk due to his severe AS and if complications arise during surgery there is a possibility he may not survive.  They were discussing possibility of using a Truss instead of surgery.  I have encouraged them to talk with surgeon regarding this. Will follow with you.   2.  Severe AS - not a candidate for conventional AVR and turned down for TAVR due to severe MR.   3.  Acute on chronic diastolic CHF -  He has baseline SOB with ambulation and mild LE edema.  His BNP is elevated more than a few days ago with increased LE edema.  He needs to be diuresed more prior to surgery to avoid worsening CHF in the peri - op period.  ACE I on hold to allow more BP to diurese.  His is net 2L neg.  Sodium has actually improved with diuresis.  Lungs are clear but still has 2+ LE edema.  Will diurese 1 more day prior to surgery.    4.  PAF - maintaining NSR.  Pradaxa on hold for surgery.     Fransico Him, MD  11/27/2015  9:22 AM

## 2015-11-27 NOTE — Progress Notes (Signed)
  Subjective: No complaints Family at bedside   Objective: Vital signs in last 24 hours: Temp:  [97.8 F (36.6 C)-98 F (36.7 C)] 97.8 F (36.6 C) (08/16 0452) Pulse Rate:  [82-85] 85 (08/16 0452) Resp:  [16-79] 16 (08/16 0452) BP: (100-112)/(56-74) 109/56 (08/16 0452) SpO2:  [96 %-99 %] 96 % (08/16 0452) Weight:  [80.9 kg (178 lb 6.4 oz)-81.3 kg (179 lb 4.8 oz)] 80.9 kg (178 lb 6.4 oz) (08/16 0700) Last BM Date: 11/24/15  Intake/Output from previous day: 08/15 0701 - 08/16 0700 In: 480 [P.O.:480] Out: 2225 [Urine:2225] Intake/Output this shift: Total I/O In: -  Out: 350 [Urine:350]  GI: BIH redcible   Lab Results:   Recent Labs  11/24/15 1312  WBC 8.0  HGB 11.5*  HCT 36.0*  PLT 197   BMET  Recent Labs  11/26/15 0551 11/27/15 0737  NA 131* 133*  K 3.6 3.9  CL 97* 94*  CO2 25 28  GLUCOSE 95 101*  BUN 19 19  CREATININE 0.87 0.93  CALCIUM 8.6* 9.0   PT/INR No results for input(s): LABPROT, INR in the last 72 hours. ABG No results for input(s): PHART, HCO3 in the last 72 hours.  Invalid input(s): PCO2, PO2  Studies/Results: No results found.  Anti-infectives: Anti-infectives    None      Assessment/Plan: BIH Pt has decided on surgical repair of each with mesh Understands high operative risk  Family at bedside and aware The risk of hernia repair include bleeding,  Infection,   Recurrence of the hernia,  Mesh use, chronic pain,  Organ injury,  Bowel injury,  Bladder injury,   nerve injury with numbness around the incision,  Death,  and worsening of preexisting  medical problems.  The alternatives to surgery have been discussed as well..  Long term expectations of both operative and non operative treatments have been discussed.   The patient agrees to proceed.    LOS: 1 day    Morgana Rowley A. 11/27/2015

## 2015-11-27 NOTE — Progress Notes (Signed)
PROGRESS NOTE  Aaron Lucas [id number] DOB: [birth year] DOA: 11/24/2015 PCP: Geoffery Lyons, MD  Brief History:  82 y.o.malewith a medical history significant for, but not limited to aortic stenosis PAF, prostate cancer, hypertension. Patient was in the emergency department 2 days ago with left inguinal bulge and discomfort. Patient was seen at [facility] surgery in the emergency department where a left inguinal hernia was reduced and patient discharged with plans for outpatient follow-up. Patient return to emergency department today for recurrent bulge and pain. Patient denies fever, nausea, vomiting and CP.   Assessment/Plan: Left inguinal hernia -reduced two days ago but re-herniated with associated pain.  -11/26/15--CT scan shows segment of non-obstructed sigmoid colon extending into the hernia. No bowel wall thickening or obstruction seen on image studies. -Appreciate surgery's assistance.   -Holding pradaxa -high risk for surgery given medical history and age. -appreciate cardiology consult/followup    Acute on chronic systolic and diastolic CHF-- Severe AS/MR -not good candidate for TAVR or surgical AVR  -11/26/2015 echo EF 45-50 percent, grade 3 DD, severe ALS, MR -continue IV diuretics as dictated by cardiology -follow daily wts and strict intake and output -NEG 1.9L -continue low sodium diet  Hypertension,  -soft currently - hold home ACEI  -monitor VS -continue diuresis as recommended by cardiology   Atrial fibrillation,  -rate controlled.  -Chadsvasc 4. On Pradaxa.  -Hold home pradaxa in anticipation for surgery.  Hyponatremia.  -Secondary to fluid overload/CHF -Improving with diuresis -continue diuretics -Patient states that his furosemide home dose increased to 20 mg daily for the past 2 weeks  Abnormal u/a - + WBC -send for culture -no dysuria, no fever, no nausea or vomiting -will hold on abx's for  now    Disposition Plan:   Home when stable after surgery Family Communication:   Wife updated at bedside  Consultants:  General Surgery; cardiology  Code Status:  FULL  DVT Prophylaxis: Pradaxa on hold for surgery   Procedures: As Listed in Progress Note Above  Antibiotics: None    Subjective: Patient denies fevers, chills, headache, chest pain, dyspnea, nausea, vomiting, diarrhea, abdominal pain, dysuria, hematuria, hematochezia, and melena.   Objective: Vitals:   11/26/15 2202 11/27/15 0452 11/27/15 0700 11/27/15 1128  BP: 112/74 (!) 109/56  (!) 88/43  Pulse: 82 85  74  Resp: 16 16  18   Temp: 98 F (36.7 C) 97.8 F (36.6 C)    TempSrc: Oral Oral    SpO2: 98% 96%  100%  Weight:  81.3 kg (179 lb 4.8 oz) 80.9 kg (178 lb 6.4 oz)     Intake/Output Summary (Last 24 hours) at 11/27/15 1801 Last data filed at 11/27/15 0900  Gross per 24 hour  Intake                0 ml  Output             1275 ml  Net            -1275 ml   Weight change: 1.225 kg (2 lb 11.2 oz) Exam:   General:  Pt is alert, follows commands appropriately, not in acute distress  HEENT: No icterus, No thrush, No neck mass, [place]/AT  Cardiovascular: RRR, S1/S2, no rubs, no gallops  Respiratory: Fine bibasilar crackles. No wheezing. Good air movement  Abdomen: Soft/+BS, non tender, non distended, no guarding  Extremities: 1 + LE edema, No lymphangitis, No petechiae, No rashes, no synovitis  Data Reviewed: I have personally reviewed following labs and imaging studies Basic Metabolic Panel:  Recent Labs Lab 11/22/15 0543 11/24/15 1312 11/25/15 0428 11/26/15 0551 11/27/15 0737  NA 128* 126* 130* 131* 133*  K 4.4 4.2 3.8 3.6 3.9  CL 92* 95* 94* 97* 94*  CO2 24 24 27 25 28   GLUCOSE 117* 102* 90 95 101*  BUN 15 22* 20 19 19   CREATININE 0.87 0.92 1.01 0.87 0.93  CALCIUM 9.2 8.8* 8.8* 8.6* 9.0   Liver Function Tests:  Recent Labs Lab 11/22/15 0543  AST 26  ALT 28  ALKPHOS 75   BILITOT 1.4*  PROT 6.4*  ALBUMIN 3.6   No results for input(s): LIPASE, AMYLASE in the last 168 hours. No results for input(s): AMMONIA in the last 168 hours. Coagulation Profile: No results for input(s): INR, PROTIME in the last 168 hours. CBC:  Recent Labs Lab 11/22/15 0543 11/24/15 1312  WBC 8.8 8.0  NEUTROABS 6.6 5.8  HGB 11.2* 11.5*  HCT 35.8* 36.0*  MCV 86.5 85.7  PLT 209 197   Cardiac Enzymes: No results for input(s): CKTOTAL, CKMB, CKMBINDEX, TROPONINI in the last 168 hours. BNP: Invalid input(s): POCBNP CBG: No results for input(s): GLUCAP in the last 168 hours. HbA1C: No results for input(s): HGBA1C in the last 72 hours. Urine analysis:    Component Value Date/Time   COLORURINE AMBER (A) 11/22/2015 0543   APPEARANCEUR CLOUDY (A) 11/22/2015 0543   LABSPEC 1.038 (H) 11/22/2015 0543   PHURINE 5.0 11/22/2015 0543   GLUCOSEU NEGATIVE 11/22/2015 0543   HGBUR MODERATE (A) 11/22/2015 0543   BILIRUBINUR NEGATIVE 11/22/2015 0543   KETONESUR NEGATIVE 11/22/2015 0543   PROTEINUR 30 (A) 11/22/2015 0543   UROBILINOGEN 0.2 10/29/2010 1613   NITRITE NEGATIVE 11/22/2015 0543   LEUKOCYTESUR SMALL (A) 11/22/2015 0543   Sepsis Labs: @LABRCNTIP (procalcitonin:4,lacticidven:4) )No results found for this or any previous visit (from the past 240 hour(s)).   Scheduled Meds: . [START ON 11/28/2015] clindamycin (CLEOCIN) IV  300 mg Intravenous To SS-Surg  . furosemide  20 mg Intravenous BID  . guaiFENesin  600 mg Oral BID  . pantoprazole  40 mg Oral Daily  . sodium chloride flush  3 mL Intravenous Q12H   Continuous Infusions: . sodium chloride 20 mL/hr at 11/24/15 1317    Procedures/Studies: Ct Abdomen Pelvis W Contrast  Result Date: 11/24/2015 CLINICAL DATA:  LEFT lower quadrant abdominal pain radiating to groin intermittently for 4-5 weeks, LEFT inguinal hernia, history valvular heart disease, atrial fibrillation, prostate cancer, diverticulosis, hypertension EXAM: CT  ABDOMEN AND PELVIS WITH CONTRAST TECHNIQUE: Multidetector CT imaging of the abdomen and pelvis was performed using the standard protocol following bolus administration of intravenous contrast. Sagittal and coronal MPR images reconstructed from axial data set. CONTRAST:  118mL ISOVUE-300 IOPAMIDOL (ISOVUE-300) INJECTION 61% IV. Dilute oral contrast. COMPARISON:  07/01/2015 FINDINGS: Lower chest: BILATERAL pleural effusions and minimal compressive atelectasis lower lobes. Fell Hepatobiliary: Suspect tiny dependent calcified gallstone in gallbladder. Liver unremarkable Pancreas: Normal appearance Spleen: Normal appearance Adrenals/Urinary Tract: Adrenal glands normal appearance. Nonobstructing LEFT renal calculus. No renal mass, hydronephrosis or hydroureter. Mild bladder wall thickening question in part related to incomplete distention. Brachytherapy seed implants at prostate bed. Stomach/Bowel: Normal appendix. Segment of nonobstructed sigmoid colon extends into a LEFT inguinal hernia. No associated bowel wall thickening or obstruction. Mild sigmoid diverticulosis. Stomach collapsed, unable to exclude gastric wall thickening. Small bowel loops normal appearance. Vascular/Lymphatic: Atherosclerotic calcifications aorta, iliac arteries, coronary arteries, abdominal branch arteries including  the origins of the celiac and superior mesenteric arteries as well as RIGHT renal artery. Heart appears mildly enlarged. No adenopathy. Distended IVC and hepatic veins with reflux of contrast from RIGHT atrium into IVC and hepatic veins, in combination with delayed portal vein visualization raising question bold elevated RIGHT heart pressure and failure. Reproductive: N/A Other: Scattered ascites in abdomen and pelvis as well as within BILATERAL inguinal hernias LEFT larger than RIGHT. Scattered soft tissue edema diffusely including presacral space terrible question related to hypoproteinemia or anasarca. Musculoskeletal: Osseous  demineralization with degenerative disc disease changes lumbar spine. Degenerative changes of both hips. No acute osseous findings. IMPRESSION: BILATERAL pleural effusions and ascites as well as scattered soft tissue edema, could be due to failure normal hyperproteinemia, or anasarca. Suspect tiny calcified gallstone. Nonobstructing LEFT renal calculus. BILATERAL inguinal hernias containing fat and fluid on RIGHT and containing ascites and nonobstructed sigmoid colon on LEFT. Sigmoid diverticulosis. Aortic atherosclerosis and mild coronary artery calcification with supected elevated RIGHT heart pressure. Electronically Signed   By: Lavonia Dana M.D.   On: 11/24/2015 15:27    Haniel Fix, DO  [facility] Pager [phone]  If 7PM-7AM, please contact night-coverage [email or web address] Password [email or web address] 11/27/2015, 6:01 PM   LOS: 1 day

## 2015-11-27 NOTE — Anesthesia Preprocedure Evaluation (Addendum)
Anesthesia Evaluation  Patient identified by MRN, date of birth, ID band Patient awake    Reviewed: Allergy & Precautions, H&P , NPO status , Patient's Chart, lab work & pertinent test results  Airway Mallampati: III  TM Distance: >3 FB Neck ROM: Full    Dental no notable dental hx. (+) Teeth Intact, Dental Advisory Given   Pulmonary neg pulmonary ROS,    Pulmonary exam normal breath sounds clear to auscultation       Cardiovascular hypertension, Pt. on medications + CAD and +CHF  + dysrhythmias Atrial Fibrillation + Valvular Problems/Murmurs AS and MR  Rhythm:Regular Rate:Normal  Echo 11/26/2015 - Left ventricle: The cavity size was normal. Systolic function was mildly reduced. The estimated ejection fraction was in the range  of 45% to 50%. Wall motion was normal; there were no regional wall motion abnormalities. Doppler parameters are consistent with  a reversible restrictive pattern, indicative of decreased left ventricular diastolic compliance and/or increased left atrial  pressure (grade 3 diastolic dysfunction). - Aortic valve: Trileaflet; severely thickened, severely calcified leaflets. Valve mobility was restricted. There was severe stenosis. There was trivial regurgitation. Valve area (VTI): 0.5  cm^2. Valve area (Vmax): 0.43 cm^2. Valve area (Vmean): 0.41 cm^2. - Mitral valve: Moderately calcified annulus. Mildly thickened leaflets . Moderate prolapse, involving the posterior leaflet with possible partial flail leaflet. There was severe regurgitation directed anteriorly. - Left atrium: The atrium was severely dilated. Volume/bsa, ES, (1-plane Simpson&'s, A2C): 68.1 ml/m^2. - Right atrium: The atrium was mildly dilated. - Tricuspid valve: There was mild regurgitation. - Pulmonary arteries: Systolic pressure was moderately increased. PA peak pressure: 54 mm Hg (S).  Impressions: - Aortic stenosis has progressed and EF is reduced  (prior 55%)    Neuro/Psych negative neurological ROS  negative psych ROS   GI/Hepatic negative GI ROS, Neg liver ROS,   Endo/Other  negative endocrine ROS  Renal/GU negative Renal ROS  negative genitourinary   Musculoskeletal   Abdominal   Peds  Hematology negative hematology ROS (+)   Anesthesia Other Findings   Reproductive/Obstetrics negative OB ROS                             Anesthesia Physical  Anesthesia Plan  ASA: IV  Anesthesia Plan: General and Regional   Post-op Pain Management:    Induction: Intravenous  Airway Management Planned: Oral ETT  Additional Equipment: Arterial line  Intra-op Plan:   Post-operative Plan: Extubation in OR  Informed Consent: I have reviewed the patients History and Physical, chart, labs and discussed the procedure including the risks, benefits and alternatives for the proposed anesthesia with the patient or authorized representative who has indicated his/her understanding and acceptance.   Dental advisory given  Plan Discussed with: CRNA  Anesthesia Plan Comments:         Anesthesia Quick Evaluation

## 2015-11-28 ENCOUNTER — Encounter (HOSPITAL_COMMUNITY): Payer: Self-pay | Admitting: Certified Registered Nurse Anesthetist

## 2015-11-28 ENCOUNTER — Observation Stay (HOSPITAL_COMMUNITY): Payer: Medicare Other | Admitting: Anesthesiology

## 2015-11-28 ENCOUNTER — Encounter (HOSPITAL_COMMUNITY)
Admission: EM | Disposition: A | Discharge status: Home / Self Care | Payer: Self-pay | Source: Home / Self Care | Attending: Internal Medicine

## 2015-11-28 DIAGNOSIS — Z91018 Allergy to other foods: Secondary | ICD-10-CM | POA: Diagnosis not present

## 2015-11-28 DIAGNOSIS — I5043 Acute on chronic combined systolic (congestive) and diastolic (congestive) heart failure: Secondary | ICD-10-CM | POA: Diagnosis present

## 2015-11-28 DIAGNOSIS — Z8551 Personal history of malignant neoplasm of bladder: Secondary | ICD-10-CM | POA: Diagnosis not present

## 2015-11-28 DIAGNOSIS — D72829 Elevated white blood cell count, unspecified: Secondary | ICD-10-CM | POA: Diagnosis present

## 2015-11-28 DIAGNOSIS — I480 Paroxysmal atrial fibrillation: Secondary | ICD-10-CM | POA: Diagnosis present

## 2015-11-28 DIAGNOSIS — K4090 Unilateral inguinal hernia, without obstruction or gangrene, not specified as recurrent: Secondary | ICD-10-CM | POA: Diagnosis present

## 2015-11-28 DIAGNOSIS — Z888 Allergy status to other drugs, medicaments and biological substances status: Secondary | ICD-10-CM | POA: Diagnosis not present

## 2015-11-28 DIAGNOSIS — Z8546 Personal history of malignant neoplasm of prostate: Secondary | ICD-10-CM | POA: Diagnosis not present

## 2015-11-28 DIAGNOSIS — I10 Essential (primary) hypertension: Secondary | ICD-10-CM | POA: Diagnosis not present

## 2015-11-28 DIAGNOSIS — K4020 Bilateral inguinal hernia, without obstruction or gangrene, not specified as recurrent: Secondary | ICD-10-CM | POA: Diagnosis present

## 2015-11-28 DIAGNOSIS — I509 Heart failure, unspecified: Secondary | ICD-10-CM

## 2015-11-28 DIAGNOSIS — Z79899 Other long term (current) drug therapy: Secondary | ICD-10-CM | POA: Diagnosis not present

## 2015-11-28 DIAGNOSIS — E871 Hypo-osmolality and hyponatremia: Secondary | ICD-10-CM | POA: Diagnosis present

## 2015-11-28 DIAGNOSIS — Z880 Allergy status to penicillin: Secondary | ICD-10-CM | POA: Diagnosis not present

## 2015-11-28 DIAGNOSIS — I350 Nonrheumatic aortic (valve) stenosis: Secondary | ICD-10-CM | POA: Diagnosis present

## 2015-11-28 DIAGNOSIS — Z86010 Personal history of colonic polyps: Secondary | ICD-10-CM | POA: Diagnosis not present

## 2015-11-28 DIAGNOSIS — I110 Hypertensive heart disease with heart failure: Secondary | ICD-10-CM | POA: Diagnosis present

## 2015-11-28 DIAGNOSIS — Z66 Do not resuscitate: Secondary | ICD-10-CM | POA: Diagnosis present

## 2015-11-28 HISTORY — PX: INGUINAL HERNIA REPAIR: SHX194

## 2015-11-28 HISTORY — PX: INSERTION OF MESH: SHX5868

## 2015-11-28 LAB — BASIC METABOLIC PANEL
Anion gap: 8 (ref 5–15)
BUN: 17 mg/dL (ref 6–20)
CO2: 29 mmol/L (ref 22–32)
CREATININE: 0.86 mg/dL (ref 0.61–1.24)
Calcium: 8.7 mg/dL — ABNORMAL LOW (ref 8.9–10.3)
Chloride: 94 mmol/L — ABNORMAL LOW (ref 101–111)
GFR calc Af Amer: >60 mL/min (ref >60)
Glucose, Bld: 98 mg/dL (ref 65–99)
Potassium: 3.6 mmol/L (ref 3.5–5.1)
SODIUM: 131 mmol/L — AB (ref 135–145)

## 2015-11-28 LAB — SURGICAL PCR SCREEN
MRSA, PCR: NEGATIVE
Staphylococcus aureus: NEGATIVE

## 2015-11-28 LAB — MAGNESIUM: MAGNESIUM: 1.9 mg/dL (ref 1.7–2.4)

## 2015-11-28 SURGERY — REPAIR, HERNIA, INGUINAL, BILATERAL, ADULT
Anesthesia: Regional | Site: Groin | Laterality: Bilateral

## 2015-11-28 MED ORDER — FENTANYL CITRATE (PF) 100 MCG/2ML IJ SOLN
INTRAMUSCULAR | Status: AC
  Filled 2015-11-28: qty 4

## 2015-11-28 MED ORDER — BUPIVACAINE-EPINEPHRINE 0.25% -1:200000 IJ SOLN
INTRAMUSCULAR | Status: DC | PRN
  Administered 2015-11-28: 21 mL

## 2015-11-28 MED ORDER — FENTANYL CITRATE (PF) 100 MCG/2ML IJ SOLN: 25.0000 ug | INTRAMUSCULAR | Status: DC | PRN

## 2015-11-28 MED ORDER — CALCIUM CHLORIDE 10 % IV SOLN
INTRAVENOUS | Status: AC
  Filled 2015-11-28: qty 10

## 2015-11-28 MED ORDER — PHENYLEPHRINE HCL 10 MG/ML IJ SOLN
INTRAMUSCULAR | Status: DC | PRN
  Administered 2015-11-28: 20 ug/min via INTRAVENOUS

## 2015-11-28 MED ORDER — MEPERIDINE HCL 25 MG/ML IJ SOLN: 6.2500 mg | INTRAMUSCULAR | Status: DC | PRN

## 2015-11-28 MED ORDER — LACTATED RINGERS IV SOLN
INTRAVENOUS | Status: DC | PRN
  Administered 2015-11-28 (×2): via INTRAVENOUS

## 2015-11-28 MED ORDER — SUGAMMADEX SODIUM 200 MG/2ML IV SOLN
INTRAVENOUS | Status: AC
  Filled 2015-11-28: qty 2

## 2015-11-28 MED ORDER — NOREPINEPHRINE BITARTRATE 1 MG/ML IV SOLN
0.0000 ug/min | INTRAVENOUS | Status: AC
  Administered 2015-11-28: 4 ug/min via INTRAVENOUS
  Filled 2015-11-28: qty 4

## 2015-11-28 MED ORDER — EPHEDRINE SULFATE 50 MG/ML IJ SOLN
INTRAMUSCULAR | Status: DC | PRN
  Administered 2015-11-28 (×3): 5 mg via INTRAVENOUS

## 2015-11-28 MED ORDER — PROPOFOL 10 MG/ML IV BOLUS
INTRAVENOUS | Status: DC | PRN
  Administered 2015-11-28: 20 mg via INTRAVENOUS
  Administered 2015-11-28: 50 mg via INTRAVENOUS

## 2015-11-28 MED ORDER — PROPOFOL 10 MG/ML IV BOLUS
INTRAVENOUS | Status: AC
  Filled 2015-11-28: qty 40

## 2015-11-28 MED ORDER — BUPIVACAINE-EPINEPHRINE (PF) 0.25% -1:200000 IJ SOLN
INTRAMUSCULAR | Status: AC
  Filled 2015-11-28: qty 30

## 2015-11-28 MED ORDER — 0.9 % SODIUM CHLORIDE (POUR BTL) OPTIME
TOPICAL | Status: DC | PRN
  Administered 2015-11-28: 1000 mL

## 2015-11-28 MED ORDER — EPHEDRINE SULFATE 50 MG/ML IJ SOLN
INTRAMUSCULAR | Status: AC
  Filled 2015-11-28: qty 1

## 2015-11-28 MED ORDER — VASOPRESSIN 20 UNIT/ML IV SOLN
INTRAVENOUS | Status: AC
  Filled 2015-11-28: qty 1

## 2015-11-28 MED ORDER — LIDOCAINE HCL (CARDIAC) 20 MG/ML IV SOLN
INTRAVENOUS | Status: DC | PRN
  Administered 2015-11-28: 40 mg via INTRAVENOUS

## 2015-11-28 MED ORDER — ROCURONIUM BROMIDE 100 MG/10ML IV SOLN
INTRAVENOUS | Status: DC | PRN
Start: 2015-11-28 — End: 2015-11-28
  Administered 2015-11-28: 5 mg via INTRAVENOUS
  Administered 2015-11-28: 10 mg via INTRAVENOUS
  Administered 2015-11-28: 50 mg via INTRAVENOUS

## 2015-11-28 MED ORDER — LACTATED RINGERS IV SOLN
INTRAVENOUS | Status: DC | PRN
  Administered 2015-11-28: 09:00:00 via INTRAVENOUS

## 2015-11-28 MED ORDER — SUGAMMADEX SODIUM 200 MG/2ML IV SOLN
INTRAVENOUS | Status: DC | PRN
  Administered 2015-11-28: 160 mg via INTRAVENOUS

## 2015-11-28 MED ORDER — LIDOCAINE 2% (20 MG/ML) 5 ML SYRINGE
INTRAMUSCULAR | Status: AC
  Filled 2015-11-28: qty 5

## 2015-11-28 MED ORDER — ROPIVACAINE HCL 5 MG/ML IJ SOLN
INTRAMUSCULAR | Status: DC | PRN
  Administered 2015-11-28: 40 mL via EPIDURAL

## 2015-11-28 MED ORDER — VASOPRESSIN 20 UNIT/ML IV SOLN
INTRAVENOUS | Status: DC | PRN
  Administered 2015-11-28: .5 [IU] via INTRAVENOUS
  Administered 2015-11-28: 1 [IU] via INTRAVENOUS
  Administered 2015-11-28 (×2): .5 [IU] via INTRAVENOUS
  Administered 2015-11-28: 1 [IU] via INTRAVENOUS
  Administered 2015-11-28: .5 [IU] via INTRAVENOUS
  Administered 2015-11-28: 1 [IU] via INTRAVENOUS

## 2015-11-28 MED ORDER — ESMOLOL HCL 100 MG/10ML IV SOLN
INTRAVENOUS | Status: AC
  Filled 2015-11-28: qty 10

## 2015-11-28 MED ORDER — ONDANSETRON HCL 4 MG/2ML IJ SOLN
INTRAMUSCULAR | Status: DC | PRN
  Administered 2015-11-28: 4 mg via INTRAVENOUS

## 2015-11-28 MED ORDER — ONDANSETRON HCL 4 MG/2ML IJ SOLN
INTRAMUSCULAR | Status: AC
  Filled 2015-11-28: qty 2

## 2015-11-28 MED ORDER — ESMOLOL HCL 100 MG/10ML IV SOLN
INTRAVENOUS | Status: DC | PRN
  Administered 2015-11-28 (×2): 20 mg via INTRAVENOUS
  Administered 2015-11-28: 10 mg via INTRAVENOUS

## 2015-11-28 MED ORDER — PHENYLEPHRINE 40 MCG/ML (10ML) SYRINGE FOR IV PUSH (FOR BLOOD PRESSURE SUPPORT)
PREFILLED_SYRINGE | INTRAVENOUS | Status: AC
  Filled 2015-11-28: qty 10

## 2015-11-28 MED ORDER — MORPHINE SULFATE (PF) 2 MG/ML IV SOLN
1.0000 mg | INTRAVENOUS | Status: DC | PRN
  Filled 2015-11-28: qty 1

## 2015-11-28 MED ORDER — FENTANYL CITRATE (PF) 100 MCG/2ML IJ SOLN
INTRAMUSCULAR | Status: DC | PRN
  Administered 2015-11-28 (×2): 25 ug via INTRAVENOUS
  Administered 2015-11-28: 50 ug via INTRAVENOUS

## 2015-11-28 MED ORDER — ROCURONIUM BROMIDE 10 MG/ML (PF) SYRINGE
PREFILLED_SYRINGE | INTRAVENOUS | Status: AC
  Filled 2015-11-28: qty 10

## 2015-11-28 MED ORDER — PHENYLEPHRINE HCL 10 MG/ML IJ SOLN
INTRAMUSCULAR | Status: DC | PRN
  Administered 2015-11-28: 80 ug via INTRAVENOUS
  Administered 2015-11-28: 40 ug via INTRAVENOUS
  Administered 2015-11-28 (×3): 80 ug via INTRAVENOUS

## 2015-11-28 MED ORDER — PROMETHAZINE HCL 25 MG/ML IJ SOLN: 6.2500 mg | INTRAMUSCULAR | Status: DC | PRN

## 2015-11-28 MED ORDER — CALCIUM CHLORIDE 10 % IV SOLN
INTRAVENOUS | Status: DC | PRN
  Administered 2015-11-28 (×2): 250 mg via INTRAVENOUS

## 2015-11-28 MED ORDER — OXYCODONE-ACETAMINOPHEN 5-325 MG PO TABS
1.0000 | ORAL_TABLET | ORAL | Status: DC | PRN
  Administered 2015-11-28 (×2): 2 via ORAL
  Filled 2015-11-28 (×2): qty 2

## 2015-11-28 SURGICAL SUPPLY — 56 items
BLADE 10 SAFETY STRL DISP (BLADE) ×2 IMPLANT
BLADE 15 SAFETY STRL DISP (BLADE) ×2 IMPLANT
BLADE SURG ROTATE 9660 (MISCELLANEOUS) IMPLANT
CANISTER SUCTION 2500CC (MISCELLANEOUS) IMPLANT
CHLORAPREP W/TINT 26ML (MISCELLANEOUS) ×2 IMPLANT
COVER SURGICAL LIGHT HANDLE (MISCELLANEOUS) ×2 IMPLANT
DECANTER SPIKE VIAL GLASS SM (MISCELLANEOUS) ×2 IMPLANT
DRAIN PENROSE 1/2X12 LTX STRL (WOUND CARE) ×2 IMPLANT
DRAPE LAPAROTOMY TRNSV 102X78 (DRAPE) ×2 IMPLANT
DRAPE UTILITY XL STRL (DRAPES) ×2 IMPLANT
DRSG OPSITE POSTOP 4X8 (GAUZE/BANDAGES/DRESSINGS) ×4 IMPLANT
DRSG TEGADERM 4X4.75 (GAUZE/BANDAGES/DRESSINGS) ×4 IMPLANT
ELECT CAUTERY BLADE 6.4 (BLADE) ×2 IMPLANT
ELECT REM PT RETURN 9FT ADLT (ELECTROSURGICAL) ×2
ELECTRODE REM PT RTRN 9FT ADLT (ELECTROSURGICAL) ×1 IMPLANT
GAUZE SPONGE 4X4 16PLY XRAY LF (GAUZE/BANDAGES/DRESSINGS) ×2 IMPLANT
GLOVE BIO SURGEON STRL SZ7 (GLOVE) ×2 IMPLANT
GLOVE BIO SURGEON STRL SZ8 (GLOVE) ×2 IMPLANT
GLOVE BIOGEL PI IND STRL 7.0 (GLOVE) ×1 IMPLANT
GLOVE BIOGEL PI IND STRL 8 (GLOVE) ×1 IMPLANT
GLOVE BIOGEL PI INDICATOR 7.0 (GLOVE) ×1
GLOVE BIOGEL PI INDICATOR 8 (GLOVE) ×1
GLOVE SURG SS PI 6.5 STRL IVOR (GLOVE) ×2 IMPLANT
GOWN STRL REUS W/ TWL LRG LVL3 (GOWN DISPOSABLE) ×2 IMPLANT
GOWN STRL REUS W/ TWL XL LVL3 (GOWN DISPOSABLE) ×1 IMPLANT
GOWN STRL REUS W/TWL LRG LVL3 (GOWN DISPOSABLE) ×4
GOWN STRL REUS W/TWL XL LVL3 (GOWN DISPOSABLE) ×2
KIT BASIN OR (CUSTOM PROCEDURE TRAY) ×2 IMPLANT
KIT ROOM TURNOVER OR (KITS) ×2 IMPLANT
LIQUID BAND (GAUZE/BANDAGES/DRESSINGS) ×2 IMPLANT
MESH HERNIA SYS ULTRAPRO LRG (Mesh General) ×4 IMPLANT
NEEDLE HYPO 25GX1X1/2 BEV (NEEDLE) ×2 IMPLANT
NS IRRIG 1000ML POUR BTL (IV SOLUTION) ×2 IMPLANT
PACK SURGICAL SETUP 50X90 (CUSTOM PROCEDURE TRAY) ×2 IMPLANT
PAD ARMBOARD 7.5X6 YLW CONV (MISCELLANEOUS) ×2 IMPLANT
PENCIL BUTTON HOLSTER BLD 10FT (ELECTRODE) ×2 IMPLANT
SPONGE LAP 18X18 X RAY DECT (DISPOSABLE) ×4 IMPLANT
STAPLER VISISTAT 35W (STAPLE) ×2 IMPLANT
SUT MNCRL AB 4-0 PS2 18 (SUTURE) ×4 IMPLANT
SUT NOVA 0 T19/GS 22DT (SUTURE) IMPLANT
SUT NOVA NAB DX-16 0-1 5-0 T12 (SUTURE) ×10 IMPLANT
SUT SILK 2 0 SH (SUTURE) IMPLANT
SUT VIC AB 0 CT1 27 (SUTURE)
SUT VIC AB 0 CT1 27XBRD ANBCTR (SUTURE) IMPLANT
SUT VIC AB 2-0 CT1 27 (SUTURE) ×1
SUT VIC AB 2-0 CT1 TAPERPNT 27 (SUTURE) ×1 IMPLANT
SUT VIC AB 2-0 SH 27 (SUTURE) ×3
SUT VIC AB 2-0 SH 27X BRD (SUTURE) ×3 IMPLANT
SUT VIC AB 3-0 SH 18 (SUTURE) ×2 IMPLANT
SUT VICRYL AB 3 0 TIES (SUTURE) ×2 IMPLANT
SYR BULB 3OZ (MISCELLANEOUS) ×2 IMPLANT
SYR CONTROL 10ML LL (SYRINGE) ×2 IMPLANT
TOWEL OR 17X24 6PK STRL BLUE (TOWEL DISPOSABLE) ×2 IMPLANT
TOWEL OR 17X26 10 PK STRL BLUE (TOWEL DISPOSABLE) ×2 IMPLANT
TUBE CONNECTING 12X1/4 (SUCTIONS) IMPLANT
YANKAUER SUCT BULB TIP NO VENT (SUCTIONS) ×2 IMPLANT

## 2015-11-28 NOTE — Anesthesia Procedure Notes (Addendum)
Procedure Name: Intubation Date/Time: 11/28/2015 7:34 AM Performed by: Garrison Columbus T Pre-anesthesia Checklist: Patient identified, Emergency Drugs available, Suction available and Patient being monitored Patient Re-evaluated:Patient Re-evaluated prior to inductionOxygen Delivery Method: Circle System Utilized Preoxygenation: Pre-oxygenation with 100% oxygen Intubation Type: IV induction Ventilation: Mask ventilation without difficulty Laryngoscope Size: Mac and 3 Grade View: Grade III Tube type: Oral Tube size: 7.5 mm Number of attempts: 1 Airway Equipment and Method: Stylet Placement Confirmation: ETT inserted through vocal cords under direct vision,  positive ETCO2 and breath sounds checked- equal and bilateral Secured at: 23 cm Tube secured with: Tape Dental Injury: Teeth and Oropharynx as per pre-operative assessment  Comments: Intubation by Dario Ave

## 2015-11-28 NOTE — Progress Notes (Signed)
Incentive spirometer given to pt and pt and spouse educated on usage. Both voices understanding and denies any questions. Delia Heady RN

## 2015-11-28 NOTE — Op Note (Signed)
Bilateral Inguinal Hernia, Open with mesh  Procedure Note  Indications: The patient presented with a history of a bilateral, reducible  Inguinal hernia.  Pt very high operative risk but had had intermittent incarceration on the left.  He has bilateral hernia.  Evaluated by cardiology and given high risk.  He decided to proceed knowing this. The risk of hernia repair include bleeding,  Infection,   Recurrence of the hernia,  Mesh use, chronic pain,  Organ injury,  Bowel injury,  Bladder injury,   nerve injury with numbness around the incision,  Death,  and worsening of preexisting  medical problems.  The alternatives to surgery have been discussed as well..  Long term expectations of both operative and non operative treatments have been discussed.   The patient agrees to proceed.  Pre-operative Diagnosis: bilateral reducible inguinal hernia   Post-operative Diagnosis: same  Surgeon: Erroll Luna A.   Assistants: OR   Anesthesia: General endotracheal anesthesia and Local anesthesia 0.25.% bupivacaine, with epinephrine  ASA Class: 4  Procedure Details  The patient was seen again in the Holding Room. The risks, benefits, complications, treatment options, and expected outcomes were discussed with the patient. The possibilities of reaction to medication, pulmonary aspiration, perforation of viscus, bleeding, recurrent infection, the need for additional procedures, and development of a complication requiring transfusion or further operation were discussed with the patient and/or family. There was concurrence with the proposed plan, and informed consent was obtained. The site of surgery was properly noted/marked. The patient was taken to the Operating Room, identified as Aaron Lucas, and the procedure verified as hernia repair. A Time Out was held and the above information confirmed.  The patient was placed in the supine position and underwent induction of anesthesia, the lower abdomen and groin  was prepped and draped in the standard fashion, and 0.25% Marcaine with epinephrine was used to anesthetize the skin over the mid-portion of the inguinal canal. A transverse incision was made on the right . Dissection was carried through the soft tissue to expose the inguinal canal and inguinal ligament along its lower edge. The external oblique fascia was split along the course of its fibers, exposing the inguinal canal. The cord and nerve were looped using a Penrose drain and reflected out of the field. The defect was exposed and a piece of prolene hernia system ultrapro mesh was and placed into  the defect. Interupted 1-0 novafil suture was then used  to repair the defect, with the suture being sewn from the pubic tubercle inferiorly and superiorly along the canal to a level just beyond the internal ring. The mesh was split to allow passage of the cord and nerve into the canal without entrapment. The contents were then returned to canal and the external oblique fashion was then closed in a continuous fashion using 3-0 Vicryl suture taking care not to cause entrapment. Scarpa's layer closed with 3 0 vicryl and staples  used to close the skin.  Honeycomb used for dressing.   A transverse incision was made on the left . Dissection was carried through the soft tissue to expose the inguinal canal and inguinal ligament along its lower edge. The external oblique fascia was split along the course of its fibers, exposing the inguinal canal. The cord and nerve were looped using a Penrose drain and reflected out of the field. The defect was exposed and a piece of prolene hernia system ultrapro mesh was and placed into  The indirect defect after reducing sac .  Interupted 1-0 novafil suture was then used  to repair the defect, with the suture being sewn from the pubic tubercle inferiorly and superiorly along the canal to a level just beyond the internal ring. The mesh was split to allow passage of the cord and nerve into  the canal without entrapment. The contents were then returned to canal and the external oblique fashion was then closed in a continuous fashion using 3-0 Vicryl suture taking care not to cause entrapment. Scarpa's layer closed with 3 0 vicryl and staples  used to close the skin.  Honeycomb  used for dressing.  Instrument, sponge, and needle counts were correct prior to closure and at the conclusion of the case.  Findings: Hernia as above  Estimated Blood Loss: Minimal         Drains: None         Total IV Fluids: 5000 mL         Specimens: none                Complications: None; patient tolerated the procedure well.         Disposition: PACU - hemodynamically stable.         Condition: stable

## 2015-11-28 NOTE — Anesthesia Procedure Notes (Signed)
Anesthesia Regional Block:  TAP block  Pre-Anesthetic Checklist: ,, timeout performed, Correct Patient, Correct Site, Correct Laterality, Correct Procedure, Correct Position, site marked, Risks and benefits discussed, Surgical consent,  Pre-op evaluation,  Post-op pain management  Laterality: Right and Left  Prep: chloraprep       Needles:   Needle Type: Stimiplex     Needle Length: 9cm 9 cm     Additional Needles:  Procedures: ultrasound guided (picture in chart) TAP block Narrative:  Injection made incrementally with aspirations every 5 mL.  Performed by: Personally  Anesthesiologist: Nolon Nations  Additional Notes: Patient tolerated well. Good fascial spread noted.

## 2015-11-28 NOTE — Transfer of Care (Signed)
Immediate Anesthesia Transfer of Care Note  Patient: Aaron Lucas  Procedure(s) Performed: Procedure(s): BILATERAL INGUINAL HERNIA REPAIR (Bilateral) INSERTION OF MESH (Bilateral)  Patient Location: PACU  Anesthesia Type:General  Level of Consciousness: awake and alert   Airway & Oxygen Therapy: Patient Spontanous Breathing and Patient connected to face mask oxygen  Post-op Assessment: Report given to RN, Post -op Vital signs reviewed and stable and Patient moving all extremities X 4  Post vital signs: Reviewed and stable  Last Vitals:  Vitals:   11/27/15 2051 11/28/15 0616  BP: (!) 112/56 113/63  Pulse: 82 82  Resp: 18 16  Temp: 36.2 C 36.7 C    Last Pain:  Vitals:   11/28/15 0616  TempSrc: Oral  PainSc:          Complications: No apparent anesthesia complications

## 2015-11-28 NOTE — H&P (View-Only) (Signed)
  Subjective: No complaints Family at bedside   Objective: Vital signs in last 24 hours: Temp:  [97.8 F (36.6 C)-98 F (36.7 C)] 97.8 F (36.6 C) (08/16 0452) Pulse Rate:  [82-85] 85 (08/16 0452) Resp:  [16-79] 16 (08/16 0452) BP: (100-112)/(56-74) 109/56 (08/16 0452) SpO2:  [96 %-99 %] 96 % (08/16 0452) Weight:  [80.9 kg (178 lb 6.4 oz)-81.3 kg (179 lb 4.8 oz)] 80.9 kg (178 lb 6.4 oz) (08/16 0700) Last BM Date: 11/24/15  Intake/Output from previous day: 08/15 0701 - 08/16 0700 In: 480 [P.O.:480] Out: 2225 [Urine:2225] Intake/Output this shift: Total I/O In: -  Out: 350 [Urine:350]  GI: BIH redcible   Lab Results:   Recent Labs  11/24/15 1312  WBC 8.0  HGB 11.5*  HCT 36.0*  PLT 197   BMET  Recent Labs  11/26/15 0551 11/27/15 0737  NA 131* 133*  K 3.6 3.9  CL 97* 94*  CO2 25 28  GLUCOSE 95 101*  BUN 19 19  CREATININE 0.87 0.93  CALCIUM 8.6* 9.0   PT/INR No results for input(s): LABPROT, INR in the last 72 hours. ABG No results for input(s): PHART, HCO3 in the last 72 hours.  Invalid input(s): PCO2, PO2  Studies/Results: No results found.  Anti-infectives: Anti-infectives    None      Assessment/Plan: BIH Pt has decided on surgical repair of each with mesh Understands high operative risk  Family at bedside and aware The risk of hernia repair include bleeding,  Infection,   Recurrence of the hernia,  Mesh use, chronic pain,  Organ injury,  Bowel injury,  Bladder injury,   nerve injury with numbness around the incision,  Death,  and worsening of preexisting  medical problems.  The alternatives to surgery have been discussed as well..  Long term expectations of both operative and non operative treatments have been discussed.   The patient agrees to proceed.    LOS: 1 day    Onya Eutsler A. 11/27/2015

## 2015-11-28 NOTE — Progress Notes (Signed)
PROGRESS NOTE  Aaron Lucas [id number] DOB: 02-10-1929 DOA: 11/24/2015 PCP: Geoffery Lyons, MD  Brief History:  82 y.o.malewith a medical history significant for, but not limited to aortic stenosis PAF, prostate cancer, hypertension. Patient was in the emergency department 2 days ago with left inguinal bulge and discomfort. Patient was seen at [facility] surgery in the emergency department where a left inguinal hernia was reduced and patient discharged with plans for outpatient follow-up. Patient return to emergency department 8/13 for recurrent bulge and pain. The patient was seen by general surgery and recommended herniorrhaphy when cleared by cardiology. Echocardiogram showed EF 45-50 percent, grade 3 DD with severe as and severe MR. Although the patient was high risk for surgery, the patient and wife understood and accepted this risk.   Assessment/Plan: Left inguinal hernia -reduced two days ago but re-herniated with associated pain.  -11/26/15--CT scan shows segment of non-obstructed sigmoid colon extending into the hernia. No bowel wall thickening or obstruction seen on image studies. -Appreciate surgery's assistance.  -11/28/15--herniorrhaphy -restart Pradaxa when ok with general surgery -high risk for surgery given medical history and age. -appreciate cardiology consult/followup    Acute on chronic systolic and diastolic CHF-- Severe AS/MR -not good candidate for TAVR or surgical AVR  -11/26/2015 echo EF 45-50 percent, grade 3 DD, severe AS, MR -continue IV diuretics as dictated by cardiology -follow daily wts and  intake and output -NEG 2.6L before surgery today -continue low sodium diet  Hypertension,  -soft currently - hold home ACEI  -monitor VS -continue diuresis as recommended by cardiology   Atrial fibrillation,  -rate controlled.  -Chadsvasc 4. On Pradaxa.  -Hold home pradaxa in anticipation for  surgery.  Hyponatremia.  -Secondary to fluid overload/CHF -appears to be chronic dating back to July 2012 with ranges of 128-135 -Improving/stable with diuresis -continue diuretics -Patient states that his furosemide home dose increased to 20 mg daily for the past 2 weeks  Abnormal u/a - + WBC -send for culture -no dysuria, no fever, no nausea or vomiting -will hold on abx's for now    Disposition Plan:   Home when stable after surgery Family Communication:   Wife updated at bedside  Consultants:  General Surgery; cardiology  Code Status:  FULL  DVT Prophylaxis: Pradaxa on hold for surgery   Procedures: As Listed in Progress Note Above  Antibiotics: None   Subjective: Patient complains of some pain at the surgical site but denies any fevers, chills, chest pain, shortness breath, nausea, vomiting, diarrhea, abdominal pain. No dysuria or hematuria. No rashes.  Objective: Vitals:   11/28/15 1015 11/28/15 1030 11/28/15 1045 11/28/15 1100  BP: (!) 98/58 (!) 115/93 116/88 120/63  Pulse: 78 82 81 81  Resp: 12 (!) 21 15 20   Temp:    97.6 F (36.4 C)  TempSrc:    Oral  SpO2: 99% 100% 99% 98%  Weight:        Intake/Output Summary (Last 24 hours) at 11/28/15 1731 Last data filed at 11/28/15 1716  Gross per 24 hour  Intake          3668.83 ml  Output             1030 ml  Net          2638.83 ml   Weight change: -0.816 kg (-1 lb 12.8 oz) Exam:   General:  Pt is alert, follows commands appropriately, not in acute distress  HEENT: No  icterus, No thrush, No neck mass, [place]/AT  Cardiovascular: RRR, S1/S2, no rubs, no gallops  Respiratory: Bibasilar crackles. No wheezing. Good air movement.  Abdomen: Soft/+BS, non tender, non distended, no guarding  Extremities: 1 + LE edema, No lymphangitis, No petechiae, No rashes, no synovitis   Data Reviewed: I have personally reviewed following labs and imaging studies Basic Metabolic  Panel:  Recent Labs Lab 11/24/15 1312 11/25/15 0428 11/26/15 0551 11/27/15 0737 11/28/15 0524  NA 126* 130* 131* 133* 131*  K 4.2 3.8 3.6 3.9 3.6  CL 95* 94* 97* 94* 94*  CO2 24 27 25 28 29   GLUCOSE 102* 90 95 101* 98  BUN 22* 20 19 19 17   CREATININE 0.92 1.01 0.87 0.93 0.86  CALCIUM 8.8* 8.8* 8.6* 9.0 8.7*  MG  --   --   --   --  1.9   Liver Function Tests:  Recent Labs Lab 11/22/15 0543  AST 26  ALT 28  ALKPHOS 75  BILITOT 1.4*  PROT 6.4*  ALBUMIN 3.6   No results for input(s): LIPASE, AMYLASE in the last 168 hours. No results for input(s): AMMONIA in the last 168 hours. Coagulation Profile: No results for input(s): INR, PROTIME in the last 168 hours. CBC:  Recent Labs Lab 11/22/15 0543 11/24/15 1312  WBC 8.8 8.0  NEUTROABS 6.6 5.8  HGB 11.2* 11.5*  HCT 35.8* 36.0*  MCV 86.5 85.7  PLT 209 197   Cardiac Enzymes: No results for input(s): CKTOTAL, CKMB, CKMBINDEX, TROPONINI in the last 168 hours. BNP: Invalid input(s): POCBNP CBG: No results for input(s): GLUCAP in the last 168 hours. HbA1C: No results for input(s): HGBA1C in the last 72 hours. Urine analysis:    Component Value Date/Time   COLORURINE AMBER (A) 11/22/2015 0543   APPEARANCEUR CLOUDY (A) 11/22/2015 0543   LABSPEC 1.038 (H) 11/22/2015 0543   PHURINE 5.0 11/22/2015 0543   GLUCOSEU NEGATIVE 11/22/2015 0543   HGBUR MODERATE (A) 11/22/2015 0543   BILIRUBINUR NEGATIVE 11/22/2015 0543   KETONESUR NEGATIVE 11/22/2015 0543   PROTEINUR 30 (A) 11/22/2015 0543   UROBILINOGEN 0.2 10/29/2010 1613   NITRITE NEGATIVE 11/22/2015 0543   LEUKOCYTESUR SMALL (A) 11/22/2015 0543   Sepsis Labs: @LABRCNTIP (procalcitonin:4,lacticidven:4) ) Recent Results (from the past 240 hour(s))  Surgical pcr screen     Status: None   Collection Time: 11/27/15 10:01 PM  Result Value Ref Range Status   MRSA, PCR NEGATIVE NEGATIVE Final   Staphylococcus aureus NEGATIVE NEGATIVE Final    Comment:        The Xpert  SA Assay (FDA approved for NASAL specimens in patients over 41 years of age), is one component of a comprehensive surveillance program.  Test performance has been validated by [facility] for patients greater than or equal to 3 year old. It is not intended to diagnose infection nor to guide or monitor treatment.      Scheduled Meds: . furosemide  20 mg Intravenous BID  . guaiFENesin  600 mg Oral BID  . pantoprazole  40 mg Oral Daily  . sodium chloride flush  3 mL Intravenous Q12H   Continuous Infusions: . sodium chloride 20 mL/hr at 11/24/15 1317    Procedures/Studies: Ct Abdomen Pelvis W Contrast  Result Date: 11/24/2015 CLINICAL DATA:  LEFT lower quadrant abdominal pain radiating to groin intermittently for 4-5 weeks, LEFT inguinal hernia, history valvular heart disease, atrial fibrillation, prostate cancer, diverticulosis, hypertension EXAM: CT ABDOMEN AND PELVIS WITH CONTRAST TECHNIQUE: Multidetector CT imaging of the  abdomen and pelvis was performed using the standard protocol following bolus administration of intravenous contrast. Sagittal and coronal MPR images reconstructed from axial data set. CONTRAST:  134mL ISOVUE-300 IOPAMIDOL (ISOVUE-300) INJECTION 61% IV. Dilute oral contrast. COMPARISON:  07/01/2015 FINDINGS: Lower chest: BILATERAL pleural effusions and minimal compressive atelectasis lower lobes. Fell Hepatobiliary: Suspect tiny dependent calcified gallstone in gallbladder. Liver unremarkable Pancreas: Normal appearance Spleen: Normal appearance Adrenals/Urinary Tract: Adrenal glands normal appearance. Nonobstructing LEFT renal calculus. No renal mass, hydronephrosis or hydroureter. Mild bladder wall thickening question in part related to incomplete distention. Brachytherapy seed implants at prostate bed. Stomach/Bowel: Normal appendix. Segment of nonobstructed sigmoid colon extends into a LEFT inguinal hernia. No associated bowel wall thickening or obstruction. Mild  sigmoid diverticulosis. Stomach collapsed, unable to exclude gastric wall thickening. Small bowel loops normal appearance. Vascular/Lymphatic: Atherosclerotic calcifications aorta, iliac arteries, coronary arteries, abdominal branch arteries including the origins of the celiac and superior mesenteric arteries as well as RIGHT renal artery. Heart appears mildly enlarged. No adenopathy. Distended IVC and hepatic veins with reflux of contrast from RIGHT atrium into IVC and hepatic veins, in combination with delayed portal vein visualization raising question bold elevated RIGHT heart pressure and failure. Reproductive: N/A Other: Scattered ascites in abdomen and pelvis as well as within BILATERAL inguinal hernias LEFT larger than RIGHT. Scattered soft tissue edema diffusely including presacral space terrible question related to hypoproteinemia or anasarca. Musculoskeletal: Osseous demineralization with degenerative disc disease changes lumbar spine. Degenerative changes of both hips. No acute osseous findings. IMPRESSION: BILATERAL pleural effusions and ascites as well as scattered soft tissue edema, could be due to failure normal hyperproteinemia, or anasarca. Suspect tiny calcified gallstone. Nonobstructing LEFT renal calculus. BILATERAL inguinal hernias containing fat and fluid on RIGHT and containing ascites and nonobstructed sigmoid colon on LEFT. Sigmoid diverticulosis. Aortic atherosclerosis and mild coronary artery calcification with supected elevated RIGHT heart pressure. Electronically Signed   By: Lavonia Dana M.D.   On: 11/24/2015 15:27    Cleveland Paiz, DO  [facility] Pager [phone]  If 7PM-7AM, please contact night-coverage [email or web address] Password [email or web address] 11/28/2015, 5:31 PM   LOS: 1 day

## 2015-11-28 NOTE — Interval H&P Note (Signed)
History and Physical Interval Note:  11/28/2015 7:26 AM  Aaron Lucas  has presented today for surgery, with the diagnosis of bilateral inguinal hernia  The various methods of treatment have been discussed with the patient and family. After consideration of risks, benefits and other options for treatment, the patient has consented to  Procedure(s): BILATERAL INGUINAL HERNIA REPAIR (Bilateral) INSERTION OF MESH (Bilateral) as a surgical intervention .  The patient's history has been reviewed, patient examined, no change in status, stable for surgery.  I have reviewed the patient's chart and labs.  Questions were answered to the patient's satisfaction.     Lagretta Loseke A.

## 2015-11-28 NOTE — Progress Notes (Signed)
Pt arrived back to the room from pacu at 1100; pt alert and verbally responsive; IV flushed and saline locked; telemetry reapplied and verified with CCMD: pt bilateral groin incision honeycomb dsg remains clean, dry and intact with no active bleeding noted. Both groin level 0; pt remains on 2L oxygen, continuous pulse ox applied; condom cath placed on pt upon request. Will continue to closely monitor pt. Pt resting comfortably in bed with call light within reach and family at bedside. Francis Gaines Katalyn Matin RN.

## 2015-11-29 ENCOUNTER — Encounter (HOSPITAL_COMMUNITY): Payer: Self-pay | Admitting: Surgery

## 2015-11-29 DIAGNOSIS — I5043 Acute on chronic combined systolic (congestive) and diastolic (congestive) heart failure: Secondary | ICD-10-CM

## 2015-11-29 LAB — BASIC METABOLIC PANEL
Anion gap: 7 (ref 5–15)
BUN: 16 mg/dL (ref 6–20)
CHLORIDE: 94 mmol/L — AB (ref 101–111)
CO2: 29 mmol/L (ref 22–32)
CREATININE: 0.84 mg/dL (ref 0.61–1.24)
Calcium: 8.9 mg/dL (ref 8.9–10.3)
GFR calc Af Amer: >60 mL/min (ref >60)
GFR calc non Af Amer: >60 mL/min (ref >60)
GLUCOSE: 128 mg/dL — AB (ref 65–99)
POTASSIUM: 3.9 mmol/L (ref 3.5–5.1)
Sodium: 130 mmol/L — ABNORMAL LOW (ref 135–145)

## 2015-11-29 LAB — CBC
HEMATOCRIT: 33.5 % — AB (ref 39.0–52.0)
HEMOGLOBIN: 10.2 g/dL — AB (ref 13.0–17.0)
MCH: 26.4 pg (ref 26.0–34.0)
MCHC: 30.4 g/dL (ref 30.0–36.0)
MCV: 86.8 fL (ref 78.0–100.0)
Platelets: 143 10*3/uL — ABNORMAL LOW (ref 150–400)
RBC: 3.86 MIL/uL — ABNORMAL LOW (ref 4.22–5.81)
RDW: 15.2 % (ref 11.5–15.5)
WBC: 10 10*3/uL (ref 4.0–10.5)

## 2015-11-29 MED ORDER — DABIGATRAN ETEXILATE MESYLATE 150 MG PO CAPS
150.0000 mg | ORAL_CAPSULE | Freq: Two times a day (BID) | ORAL | Status: DC
  Administered 2015-11-29 – 2015-12-01 (×4): 150 mg via ORAL
  Filled 2015-11-29 (×4): qty 1

## 2015-11-29 NOTE — Progress Notes (Signed)
ANTICOAGULATION CONSULT NOTE - Initial Consult  Pharmacy Consult:  Pradaxa Indication: atrial fibrillation  Allergies  Allergen Reactions  . Ambien [Zolpidem Tartrate] Other (See Comments)    Per patient and wife "hallucinations and sleep walking" (tried to climb out of the windows in the middle of the night)  . Penicillins Swelling and Rash    SWELLING REACTION UNSPECIFIED  Has patient had a PCN reaction causing immediate rash, facial/tongue/throat swelling, SOB or lightheadedness with hypotension: unknown Has patient had a PCN reaction causing severe rash involving mucus membranes or skin necrosis: No Has patient had a PCN reaction that required hospitalization No Has patient had a PCN reaction occurring within the last 10 years: No    . Diamox [Acetazolamide] Other (See Comments)    UNSPECIFIED   . Other Other (See Comments)    Nuts, pecans only--unknown  . Ciprofloxacin Rash  . Doxycycline Rash  . Erythromycin Rash  . Neptazane [Methazolamide] Other (See Comments)    UNSPECIFIED     Patient Measurements: Weight: 80 lb 7.2 oz (36.5 kg)  Vital Signs: Temp: 97.5 F (36.4 C) (08/18 1215) Temp Source: Oral (08/18 1215) BP: 102/43 (08/18 1215) Pulse Rate: 77 (08/18 1215)  Labs:  Recent Labs  11/27/15 0737 11/28/15 0524 11/29/15 0223  HGB  --   --  10.2*  HCT  --   --  33.5*  PLT  --   --  143*  CREATININE 0.93 0.86 0.84    Estimated Creatinine Clearance: 33.8 mL/min (by C-G formula based on SCr of 0.84 mg/dL).   Medical History: Past Medical History:  Diagnosis Date  . Cataracts, bilateral   . Chronic diastolic CHF (congestive heart failure) ([facility]) 11/25/2015  . Detached retina Twice   As a result he is lost vision in the left eye.  . Diverticulosis 2006  . Hx of colonic polyps 2006,  2011  . Hypertension   . Paroxysmal atrial fibrillation ([facility])    On Pradaxa.   . Prostate cancer ([place]) 2004   seed implant  . 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 Valvular heart disease    MV regurge and Ao stenosis  . Vertigo       Assessment: 77 YOM with history of Afib on Pradaxa PTA.  Pradaxa was held upon admission for inguinal hernia repair on 11/28/15 and now to resume.  Of note, he has a history of GIB thought due to AVMs.  Cards' plan was to continue Pradaxa unless GIB recurs.  His hemoglobin and platelet count are trending down; however, no overt bleeding reported.  His renal function is stable and LFTs are WNL.  Total bilirubin is slightly elevated.   Goal of Therapy:  Appropriate anticoagulation Monitor platelets by anticoagulation protocol: Yes    Plan:  - Pradaxa 150mg  PO BID for CrCL > 30 ml/min - Pharmacy will sign off and following peripherally.  Thank you for the consult!   Lillieann Pavlich D. Mina Marble, PharmD, BCPS Pager:  [phone] 11/29/2015, 6:55 PM

## 2015-11-29 NOTE — Progress Notes (Signed)
SUBJECTIVE:  Doing well with no complaints of SOB  OBJECTIVE:   Vitals:   Vitals:   11/28/15 1630 11/28/15 2028 11/28/15 2346 11/29/15 0525  BP: 118/76 (!) 99/48 101/66 94/60  Pulse: 70 (!) 103 85 78  Resp: 18 18 16 16   Temp: 97.1 F (36.2 C) 98.3 F (36.8 C) 97.7 F (36.5 C) 97.9 F (36.6 C)  TempSrc: Oral Oral Oral Oral  SpO2: 98% 97% 100% 96%  Weight:    80 lb 7.2 oz (36.5 kg)   I&O's:   Intake/Output Summary (Last 24 hours) at 11/29/15 0839 Last data filed at 11/28/15 1900  Gross per 24 hour  Intake          2878.83 ml  Output              280 ml  Net          2598.83 ml   TELEMETRY: Reviewed telemetry pt in NSR:     PHYSICAL EXAM General: Well developed, well nourished, in no acute distress Head: Eyes PERRLA, No xanthomas.   Normal cephalic and atramatic  Lungs:   Clear bilaterally to auscultation and percussion. Heart:   HRRR S1 S2 Pulses are 2+ & equal. Abdomen: Bowel sounds are positive, abdomen soft and non-tender without masses  Msk:  Back normal, normal gait. Normal strength and tone for age. Extremities:   No clubbing, cyanosis or edema.  DP +1 Neuro: Alert and oriented X 3. Psych:  Good affect, responds appropriately   LABS: Basic Metabolic Panel:  Recent Labs  11/28/15 0524 11/29/15 0223  NA 131* 130*  K 3.6 3.9  CL 94* 94*  CO2 29 29  GLUCOSE 98 128*  BUN 17 16  CREATININE 0.86 0.84  CALCIUM 8.7* 8.9  MG 1.9  --    Liver Function Tests: No results for input(s): AST, ALT, ALKPHOS, BILITOT, PROT, ALBUMIN in the last 72 hours. No results for input(s): LIPASE, AMYLASE in the last 72 hours. CBC:  Recent Labs  11/29/15 0223  WBC 10.0  HGB 10.2*  HCT 33.5*  MCV 86.8  PLT 143*   Cardiac Enzymes: No results for input(s): CKTOTAL, CKMB, CKMBINDEX, TROPONINI in the last 72 hours. BNP: Invalid input(s): POCBNP D-Dimer: No results for input(s): DDIMER in the last 72 hours. Hemoglobin A1C: No results for input(s): HGBA1C in the  last 72 hours. Fasting Lipid Panel: No results for input(s): CHOL, HDL, LDLCALC, TRIG, CHOLHDL, LDLDIRECT in the last 72 hours. Thyroid Function Tests: No results for input(s): TSH, T4TOTAL, T3FREE, THYROIDAB in the last 72 hours.  Invalid input(s): FREET3 Anemia Panel: No results for input(s): VITAMINB12, FOLATE, FERRITIN, TIBC, IRON, RETICCTPCT in the last 72 hours. Coag Panel:   Lab Results  Component Value Date   INR 1.63 (H) 04/16/2015   INR 1.41 11/13/2014   INR 1.54 (H) 11/12/2014    RADIOLOGY: Ct Abdomen Pelvis W Contrast  Result Date: 11/24/2015 CLINICAL DATA:  LEFT lower quadrant abdominal pain radiating to groin intermittently for 4-5 weeks, LEFT inguinal hernia, history valvular heart disease, atrial fibrillation, prostate cancer, diverticulosis, hypertension EXAM: CT ABDOMEN AND PELVIS WITH CONTRAST TECHNIQUE: Multidetector CT imaging of the abdomen and pelvis was performed using the standard protocol following bolus administration of intravenous contrast. Sagittal and coronal MPR images reconstructed from axial data set. CONTRAST:  136mL ISOVUE-300 IOPAMIDOL (ISOVUE-300) INJECTION 61% IV. Dilute oral contrast. COMPARISON:  07/01/2015 FINDINGS: Lower chest: BILATERAL pleural effusions and minimal compressive atelectasis lower lobes. Fell Hepatobiliary: Suspect tiny  dependent calcified gallstone in gallbladder. Liver unremarkable Pancreas: Normal appearance Spleen: Normal appearance Adrenals/Urinary Tract: Adrenal glands normal appearance. Nonobstructing LEFT renal calculus. No renal mass, hydronephrosis or hydroureter. Mild bladder wall thickening question in part related to incomplete distention. Brachytherapy seed implants at prostate bed. Stomach/Bowel: Normal appendix. Segment of nonobstructed sigmoid colon extends into a LEFT inguinal hernia. No associated bowel wall thickening or obstruction. Mild sigmoid diverticulosis. Stomach collapsed, unable to exclude gastric wall  thickening. Small bowel loops normal appearance. Vascular/Lymphatic: Atherosclerotic calcifications aorta, iliac arteries, coronary arteries, abdominal branch arteries including the origins of the celiac and superior mesenteric arteries as well as RIGHT renal artery. Heart appears mildly enlarged. No adenopathy. Distended IVC and hepatic veins with reflux of contrast from RIGHT atrium into IVC and hepatic veins, in combination with delayed portal vein visualization raising question bold elevated RIGHT heart pressure and failure. Reproductive: N/A Other: Scattered ascites in abdomen and pelvis as well as within BILATERAL inguinal hernias LEFT larger than RIGHT. Scattered soft tissue edema diffusely including presacral space terrible question related to hypoproteinemia or anasarca. Musculoskeletal: Osseous demineralization with degenerative disc disease changes lumbar spine. Degenerative changes of both hips. No acute osseous findings. IMPRESSION: BILATERAL pleural effusions and ascites as well as scattered soft tissue edema, could be due to failure normal hyperproteinemia, or anasarca. Suspect tiny calcified gallstone. Nonobstructing LEFT renal calculus. BILATERAL inguinal hernias containing fat and fluid on RIGHT and containing ascites and nonobstructed sigmoid colon on LEFT. Sigmoid diverticulosis. Aortic atherosclerosis and mild coronary artery calcification with supected elevated RIGHT heart pressure. Electronically Signed   By: Lavonia Dana M.D.   On: 11/24/2015 15:27   Assessment & Plan   82 yo with PMH of PAF (on pradaxa)/ prostate CA/MR and AS who presented to the [facility] ED with left inguinal hernia, which was planned to be corrected in the outpatient setting, with worsening pain.   1. Inguinal hernia s/p repair:He did well will surgery without any cardiac issues.    2. Severe AS -not a candidate for conventional AVR and turned down for TAVR due to severe MR. Continue conservative care.    3. Acute on chronic diastolic CHF- He has baseline SOB with ambulation and mild LE edema. His BNP was elevated more than a few days ago with increased LE edema. He has diuresed well with IV lasix.  He is now net positive 948cc due to surgery yesterday with IVF.  Sodium low again this am at 130 but he does not appear volume overloaded on exam.  Lungs are clear and no significant LE edema.  Will change back to PO lasix today.   4. PAF- maintaining NSR. Restart Pradaxa when ok with surgery.    No other recs at this time.  Will sign off.  We will get him a followup appt in our office next week.    Fransico Him, MD  11/29/2015  8:39 AM

## 2015-11-29 NOTE — Plan of Care (Signed)
Problem: Skin Integrity: Goal: Risk for impaired skin integrity will decrease Outcome: Progressing No skin issues noted  Problem: Activity: Goal: Risk for activity intolerance will decrease Outcome: Progressing OOB to BSC commode once and stayed up in the chair for 3 hours  Problem: Fluid Volume: Goal: Ability to maintain a balanced intake and output will improve Outcome: Progressing No signs of dehydration noted  Problem: Bowel/Gastric: Goal: Will not experience complications related to bowel motility Outcome: Progressing Bowel sounds active , no bowel or intestinal issues noted, abdomen soft non distended

## 2015-11-29 NOTE — Progress Notes (Addendum)
PROGRESS NOTE  Aaron Lucas [id number] DOB: 1928/07/17 DOA: 11/24/2015 PCP: Geoffery Lyons, MD  Brief History: 82 y.o.malewith a medical history significant for, but not limited to aortic stenosisPAF, prostate cancer, hypertension. Patient was in the emergency department 2 days ago with left inguinal bulge and discomfort. Patient was seen at [facility] surgery in the emergency department where a left inguinal hernia was reduced and patient discharged with plans for outpatient follow-up. Patient return to emergency department 8/13 for recurrent bulge and pain. The patient was seen by general surgery and recommended herniorrhaphy when cleared by cardiology. Echocardiogram showed EF 45-50 percent, grade 3 DD with severe as and severe MR. Although the patient was high risk for surgery, the patient and wife understood and accepted this risk.   Assessment/Plan: Left inguinal hernia -reduced two days ago but re-herniated with associated pain.  -11/26/15--CT scan showssegment of non-obstructed sigmoid colon extending into the hernia. No bowel wall thickening or obstruction seen on image studies. -high risk for surgery given medical history and age. -Appreciate surgery's assistance.  -11/28/15--herniorrhaphy -11/29/15--discussed with general surgery--ok to start pradaxa  -appreciate cardiology consult/followup   Acute on chronic systolic and diastolic CHF-- Severe AS/MR -not good candidate for TAVR or surgical AVR -11/26/2015 echo EF 45-50 percent, grade 3 DD, severe AS, MR -continue diuretics as dictated by cardiology -follow daily wts and  intake and output -NEG 2.6L before surgery, but received 3+L during surgery -continue low sodium diet  Hypertension,  -soft currently - hold home ACEI  -monitor VS -continue diuresis as recommended by cardiology   Atrial fibrillation,  -rate controlled.  -Chadsvasc 4. On Pradaxa.  -11/29/15--restart  pradaxa  Hyponatremia.  -Secondary to fluid overload/CHF -appears to be chronic dating back to July 2012 with ranges of 128-135 -Improving/stable with diuresis -continue diuretics per cardiology -Patient states that his furosemide home dose increased to 20 mg daily for the past 2 weeks  Abnormal u/a - + WBC -send for culture -no dysuria, no fever, no nausea or vomiting -will hold on abx's for now    Disposition Plan: Home when cleared by cardiology Family Communication: Wife updatedat bedside 8/18  Consultants: General Surgery; cardiology  Code Status: FULL  DVT Prophylaxis: Pradaxa   Procedures: As Listed in Progress Note Above  Antibiotics: None  Subjective: Patient denies fevers, chills, headache, chest pain, dyspnea, nausea, vomiting, diarrhea, abdominal pain, dysuria, hematuria, hematochezia, and melena.   Objective: Vitals:   11/28/15 2028 11/28/15 2346 11/29/15 0525 11/29/15 1215  BP: (!) 99/48 101/66 94/60 (!) 102/43  Pulse: (!) 103 85 78 77  Resp: 18 16 16 12   Temp: 98.3 F (36.8 C) 97.7 F (36.5 C) 97.9 F (36.6 C) 97.5 F (36.4 C)  TempSrc: Oral Oral Oral Oral  SpO2: 97% 100% 96% 100%  Weight:   36.5 kg (80 lb 7.2 oz)     Intake/Output Summary (Last 24 hours) at 11/29/15 1804 Last data filed at 11/29/15 0700  Gross per 24 hour  Intake              450 ml  Output                0 ml  Net              450 ml   Weight change: -44.022 kg (-97 lb 0.8 oz) Exam:   General:  Pt is alert, follows commands appropriately, not in acute distress  HEENT: No  icterus, No thrush, No neck mass, [place]/AT  Cardiovascular: RRR, S1/S2, no rubs, no gallops  Respiratory: CTA bilaterally, no wheezing, no crackles, no rhonchi  Abdomen: Soft/+BS, non tender, non distended, no guarding  Extremities: trace LE edema, No lymphangitis, No petechiae, No rashes, no synovitis   Data Reviewed: I have personally reviewed following labs and  imaging studies Basic Metabolic Panel:  Recent Labs Lab 11/25/15 0428 11/26/15 0551 11/27/15 0737 11/28/15 0524 11/29/15 0223  NA 130* 131* 133* 131* 130*  K 3.8 3.6 3.9 3.6 3.9  CL 94* 97* 94* 94* 94*  CO2 27 25 28 29 29   GLUCOSE 90 95 101* 98 128*  BUN 20 19 19 17 16   CREATININE 1.01 0.87 0.93 0.86 0.84  CALCIUM 8.8* 8.6* 9.0 8.7* 8.9  MG  --   --   --  1.9  --    Liver Function Tests: No results for input(s): AST, ALT, ALKPHOS, BILITOT, PROT, ALBUMIN in the last 168 hours. No results for input(s): LIPASE, AMYLASE in the last 168 hours. No results for input(s): AMMONIA in the last 168 hours. Coagulation Profile: No results for input(s): INR, PROTIME in the last 168 hours. CBC:  Recent Labs Lab 11/24/15 1312 11/29/15 0223  WBC 8.0 10.0  NEUTROABS 5.8  --   HGB 11.5* 10.2*  HCT 36.0* 33.5*  MCV 85.7 86.8  PLT 197 143*   Cardiac Enzymes: No results for input(s): CKTOTAL, CKMB, CKMBINDEX, TROPONINI in the last 168 hours. BNP: Invalid input(s): POCBNP CBG: No results for input(s): GLUCAP in the last 168 hours. HbA1C: No results for input(s): HGBA1C in the last 72 hours. Urine analysis:    Component Value Date/Time   COLORURINE AMBER (A) 11/22/2015 0543   APPEARANCEUR CLOUDY (A) 11/22/2015 0543   LABSPEC 1.038 (H) 11/22/2015 0543   PHURINE 5.0 11/22/2015 0543   GLUCOSEU NEGATIVE 11/22/2015 0543   HGBUR MODERATE (A) 11/22/2015 0543   BILIRUBINUR NEGATIVE 11/22/2015 0543   KETONESUR NEGATIVE 11/22/2015 0543   PROTEINUR 30 (A) 11/22/2015 0543   UROBILINOGEN 0.2 10/29/2010 1613   NITRITE NEGATIVE 11/22/2015 0543   LEUKOCYTESUR SMALL (A) 11/22/2015 0543   Sepsis Labs: @LABRCNTIP (procalcitonin:4,lacticidven:4) ) Recent Results (from the past 240 hour(s))  Surgical pcr screen     Status: None   Collection Time: 11/27/15 10:01 PM  Result Value Ref Range Status   MRSA, PCR NEGATIVE NEGATIVE Final   Staphylococcus aureus NEGATIVE NEGATIVE Final    Comment:         The Xpert SA Assay (FDA approved for NASAL specimens in patients over 82 years of age), is one component of a comprehensive surveillance program.  Test performance has been validated by [facility] for patients greater than or equal to 71 year old. It is not intended to diagnose infection nor to guide or monitor treatment.      Scheduled Meds: . furosemide  20 mg Intravenous BID  . guaiFENesin  600 mg Oral BID  . pantoprazole  40 mg Oral Daily  . sodium chloride flush  3 mL Intravenous Q12H   Continuous Infusions: . sodium chloride 20 mL/hr at 11/24/15 1317    Procedures/Studies: Ct Abdomen Pelvis W Contrast  Result Date: 11/24/2015 CLINICAL DATA:  LEFT lower quadrant abdominal pain radiating to groin intermittently for 4-5 weeks, LEFT inguinal hernia, history valvular heart disease, atrial fibrillation, prostate cancer, diverticulosis, hypertension EXAM: CT ABDOMEN AND PELVIS WITH CONTRAST TECHNIQUE: Multidetector CT imaging of the abdomen and pelvis was performed using the standard protocol  following bolus administration of intravenous contrast. Sagittal and coronal MPR images reconstructed from axial data set. CONTRAST:  136mL ISOVUE-300 IOPAMIDOL (ISOVUE-300) INJECTION 61% IV. Dilute oral contrast. COMPARISON:  07/01/2015 FINDINGS: Lower chest: BILATERAL pleural effusions and minimal compressive atelectasis lower lobes. Fell Hepatobiliary: Suspect tiny dependent calcified gallstone in gallbladder. Liver unremarkable Pancreas: Normal appearance Spleen: Normal appearance Adrenals/Urinary Tract: Adrenal glands normal appearance. Nonobstructing LEFT renal calculus. No renal mass, hydronephrosis or hydroureter. Mild bladder wall thickening question in part related to incomplete distention. Brachytherapy seed implants at prostate bed. Stomach/Bowel: Normal appendix. Segment of nonobstructed sigmoid colon extends into a LEFT inguinal hernia. No associated bowel wall thickening or  obstruction. Mild sigmoid diverticulosis. Stomach collapsed, unable to exclude gastric wall thickening. Small bowel loops normal appearance. Vascular/Lymphatic: Atherosclerotic calcifications aorta, iliac arteries, coronary arteries, abdominal branch arteries including the origins of the celiac and superior mesenteric arteries as well as RIGHT renal artery. Heart appears mildly enlarged. No adenopathy. Distended IVC and hepatic veins with reflux of contrast from RIGHT atrium into IVC and hepatic veins, in combination with delayed portal vein visualization raising question bold elevated RIGHT heart pressure and failure. Reproductive: N/A Other: Scattered ascites in abdomen and pelvis as well as within BILATERAL inguinal hernias LEFT larger than RIGHT. Scattered soft tissue edema diffusely including presacral space terrible question related to hypoproteinemia or anasarca. Musculoskeletal: Osseous demineralization with degenerative disc disease changes lumbar spine. Degenerative changes of both hips. No acute osseous findings. IMPRESSION: BILATERAL pleural effusions and ascites as well as scattered soft tissue edema, could be due to failure normal hyperproteinemia, or anasarca. Suspect tiny calcified gallstone. Nonobstructing LEFT renal calculus. BILATERAL inguinal hernias containing fat and fluid on RIGHT and containing ascites and nonobstructed sigmoid colon on LEFT. Sigmoid diverticulosis. Aortic atherosclerosis and mild coronary artery calcification with supected elevated RIGHT heart pressure. Electronically Signed   By: Lavonia Dana M.D.   On: 11/24/2015 15:27    Analyah Mcconnon, DO  [facility] Pager [phone]  If 7PM-7AM, please contact night-coverage [email or web address] Password [email or web address] 11/29/2015, 6:04 PM   LOS: 2 days

## 2015-11-30 LAB — URINALYSIS, ROUTINE W REFLEX MICROSCOPIC
BILIRUBIN URINE: NEGATIVE
Glucose, UA: NEGATIVE mg/dL
Ketones, ur: NEGATIVE mg/dL
Leukocytes, UA: NEGATIVE
Nitrite: NEGATIVE
PH: 6 (ref 5.0–8.0)
Protein, ur: NEGATIVE mg/dL
SPECIFIC GRAVITY, URINE: 1.012 (ref 1.005–1.030)

## 2015-11-30 LAB — CBC
HEMATOCRIT: 36.6 % — AB (ref 39.0–52.0)
HEMOGLOBIN: 11.1 g/dL — AB (ref 13.0–17.0)
MCH: 26.4 pg (ref 26.0–34.0)
MCHC: 30.3 g/dL (ref 30.0–36.0)
MCV: 87.1 fL (ref 78.0–100.0)
Platelets: 159 10*3/uL (ref 150–400)
RBC: 4.2 MIL/uL — ABNORMAL LOW (ref 4.22–5.81)
RDW: 15.3 % (ref 11.5–15.5)
WBC: 10.4 10*3/uL (ref 4.0–10.5)

## 2015-11-30 LAB — BASIC METABOLIC PANEL
ANION GAP: 8 (ref 5–15)
BUN: 20 mg/dL (ref 6–20)
CHLORIDE: 92 mmol/L — AB (ref 101–111)
CO2: 30 mmol/L (ref 22–32)
Calcium: 9 mg/dL (ref 8.9–10.3)
Creatinine, Ser: 0.91 mg/dL (ref 0.61–1.24)
GFR calc non Af Amer: >60 mL/min (ref >60)
GLUCOSE: 110 mg/dL — AB (ref 65–99)
POTASSIUM: 4.2 mmol/L (ref 3.5–5.1)
Sodium: 130 mmol/L — ABNORMAL LOW (ref 135–145)

## 2015-11-30 LAB — URINE MICROSCOPIC-ADD ON
BACTERIA UA: NONE SEEN
RBC / HPF: NONE SEEN RBC/hpf (ref 0–5)
SQUAMOUS EPITHELIAL / LPF: NONE SEEN
WBC UA: NONE SEEN WBC/hpf (ref 0–5)

## 2015-11-30 MED ORDER — FUROSEMIDE 40 MG PO TABS
40.0000 mg | ORAL_TABLET | Freq: Every day | ORAL | Status: DC
  Administered 2015-12-01: 40 mg via ORAL
  Filled 2015-11-30: qty 1

## 2015-11-30 NOTE — Progress Notes (Signed)
Nurse was notified by CCMD that patient is having Sinus pause of 1.27 sec. Nurse notified on-call MD. Nurse was asked to pass information to patient's Attending MD. On- coming nurse notified.

## 2015-11-30 NOTE — Progress Notes (Signed)
PROGRESS NOTE  Aaron Lucas [id number] DOB: 1928-07-15 DOA: 11/24/2015 PCP: Geoffery Lyons, MD  Brief History: 82 y.o.malewith a medical history significant for, but not limited to aortic stenosisPAF, prostate cancer, hypertension. Patient was in the emergency department 2 days ago with left inguinal bulge and discomfort. Patient was seen at [facility] surgery in the emergency department where a left inguinal hernia was reduced and patient discharged with plans for outpatient follow-up. Patient return to emergency department 8/69for recurrent bulge and pain. The patient was seen by general surgery and recommended herniorrhaphy when cleared by cardiology. Echocardiogram showed EF 45-50 percent, grade 3 DD with severe as and severe MR. Although the patient was high risk for surgery, the patient and wife understood and accepted this risk.   Assessment/Plan: Left inguinal hernia -reduced two days PTA but re-herniated with associated pain.  -11/26/15--CT scan showssegment of non-obstructed sigmoid colon extending into the hernia. No bowel wall thickening or obstruction seen on image studies. -high risk for surgery given medical history and age. -Appreciate surgery's assistance.  -11/28/15--herniorrhaphy -11/29/15--discussed with general surgery--ok to start pradaxa   Acute on chronic systolic and diastolic CHF-- Severe AS/MR -not good candidate for TAVR or surgical AVR -11/26/2015 echo EF 45-50 percent, grade 3 DD, severe AS, MR -continue diuretics as dictated by cardiology -follow daily wts and intake and output -NEG 2.6L before surgery, but received 3+L during surgery -I/O not accurate since then -appears more euvolemic -switch to po lasix 12/01/15 -stable on RA  Hypertension,  -soft currently - hold home ACEI  -monitor VS -continue diuresis as recommended by cardiology   Atrial fibrillation,  -rate controlled.  -Chadsvasc 3. On Pradaxa.    -11/29/15--restart pradaxa  Hyponatremia.  -Secondary to fluid overload/CHF -appears to be chronic dating back to July 2012 with ranges of 128-135 -Improving/stablewith diuresis -continue diuretics per cardiology -Patient states that his furosemide home dose increased to 40 mg daily for the past 2 weeks  Abnormal u/a - + WBC -send for culture -no dysuria, no fever, no nausea or vomiting -will hold on abx's for now    Disposition Plan: Home 8/20 if stable Family Communication: Wife updatedat bedside 8/19  Consultants: General Surgery; cardiology  Code Status: FULL  DVT Prophylaxis: Pradaxa   Procedures: As Listed in Progress Note Above  Antibiotics: None    Subjective: Patient denies fevers, chills, headache, chest pain, dyspnea, nausea, vomiting, diarrhea, abdominal pain, dysuria, hematuria, hematochezia, and melena.   Objective: Vitals:   11/30/15 0404 11/30/15 0500 11/30/15 1333 11/30/15 1507  BP: 114/61   (!) 96/55  Pulse: 88   85  Resp: 16     Temp: 98.6 F (37 C)   97.4 F (36.3 C)  TempSrc: Oral   Oral  SpO2: 96%   99%  Weight:  82.6 kg (182 lb) 83.3 kg (183 lb 11.2 oz)    No intake or output data in the 24 hours ending 11/30/15 1810 Weight change: 46.1 kg (101 lb 8.8 oz) Exam:   General:  Pt is alert, follows commands appropriately, not in acute distress  HEENT: No icterus, No thrush, No neck mass, [place]/AT  Cardiovascular: RRR, S1/S2, no rubs, no gallops  Respiratory: CTA bilaterally, no wheezing, no crackles, no rhonchi  Abdomen: Soft/+BS, non tender, non distended, no guarding  Extremities: 1 + LE edema, No lymphangitis, No petechiae, No rashes, no synovitis   Data Reviewed: I have personally reviewed following labs and imaging studies  Basic Metabolic Panel:  Recent Labs Lab 11/26/15 0551 11/27/15 0737 11/28/15 0524 11/29/15 0223 11/30/15 0434  NA 131* 133* 131* 130* 130*  K 3.6 3.9 3.6 3.9 4.2  CL  97* 94* 94* 94* 92*  CO2 25 28 29 29 30   GLUCOSE 95 101* 98 128* 110*  BUN 19 19 17 16 20   CREATININE 0.87 0.93 0.86 0.84 0.91  CALCIUM 8.6* 9.0 8.7* 8.9 9.0  MG  --   --  1.9  --   --    Liver Function Tests: No results for input(s): AST, ALT, ALKPHOS, BILITOT, PROT, ALBUMIN in the last 168 hours. No results for input(s): LIPASE, AMYLASE in the last 168 hours. No results for input(s): AMMONIA in the last 168 hours. Coagulation Profile: No results for input(s): INR, PROTIME in the last 168 hours. CBC:  Recent Labs Lab 11/24/15 1312 11/29/15 0223 11/30/15 0434  WBC 8.0 10.0 10.4  NEUTROABS 5.8  --   --   HGB 11.5* 10.2* 11.1*  HCT 36.0* 33.5* 36.6*  MCV 85.7 86.8 87.1  PLT 197 143* 159   Cardiac Enzymes: No results for input(s): CKTOTAL, CKMB, CKMBINDEX, TROPONINI in the last 168 hours. BNP: Invalid input(s): POCBNP CBG: No results for input(s): GLUCAP in the last 168 hours. HbA1C: No results for input(s): HGBA1C in the last 72 hours. Urine analysis:    Component Value Date/Time   COLORURINE AMBER (A) 11/22/2015 0543   APPEARANCEUR CLOUDY (A) 11/22/2015 0543   LABSPEC 1.038 (H) 11/22/2015 0543   PHURINE 5.0 11/22/2015 0543   GLUCOSEU NEGATIVE 11/22/2015 0543   HGBUR MODERATE (A) 11/22/2015 0543   BILIRUBINUR NEGATIVE 11/22/2015 0543   KETONESUR NEGATIVE 11/22/2015 0543   PROTEINUR 30 (A) 11/22/2015 0543   UROBILINOGEN 0.2 10/29/2010 1613   NITRITE NEGATIVE 11/22/2015 0543   LEUKOCYTESUR SMALL (A) 11/22/2015 0543   Sepsis Labs: @LABRCNTIP (procalcitonin:4,lacticidven:4) ) Recent Results (from the past 240 hour(s))  Surgical pcr screen     Status: None   Collection Time: 11/27/15 10:01 PM  Result Value Ref Range Status   MRSA, PCR NEGATIVE NEGATIVE Final   Staphylococcus aureus NEGATIVE NEGATIVE Final    Comment:        The Xpert SA Assay (FDA approved for NASAL specimens in patients over 58 years of age), is one component of a comprehensive  surveillance program.  Test performance has been validated by [facility] for patients greater than or equal to 26 year old. It is not intended to diagnose infection nor to guide or monitor treatment.      Scheduled Meds: . dabigatran  150 mg Oral Q12H  . furosemide  20 mg Intravenous BID  . guaiFENesin  600 mg Oral BID  . pantoprazole  40 mg Oral Daily  . sodium chloride flush  3 mL Intravenous Q12H   Continuous Infusions: . sodium chloride 20 mL/hr at 11/24/15 1317    Procedures/Studies: Ct Abdomen Pelvis W Contrast  Result Date: 11/24/2015 CLINICAL DATA:  LEFT lower quadrant abdominal pain radiating to groin intermittently for 4-5 weeks, LEFT inguinal hernia, history valvular heart disease, atrial fibrillation, prostate cancer, diverticulosis, hypertension EXAM: CT ABDOMEN AND PELVIS WITH CONTRAST TECHNIQUE: Multidetector CT imaging of the abdomen and pelvis was performed using the standard protocol following bolus administration of intravenous contrast. Sagittal and coronal MPR images reconstructed from axial data set. CONTRAST:  152mL ISOVUE-300 IOPAMIDOL (ISOVUE-300) INJECTION 61% IV. Dilute oral contrast. COMPARISON:  07/01/2015 FINDINGS: Lower chest: BILATERAL pleural effusions and minimal compressive atelectasis lower  lobes. [place] Hepatobiliary: Suspect tiny dependent calcified gallstone in gallbladder. Liver unremarkable Pancreas: Normal appearance Spleen: Normal appearance Adrenals/Urinary Tract: Adrenal glands normal appearance. Nonobstructing LEFT renal calculus. No renal mass, hydronephrosis or hydroureter. Mild bladder wall thickening question in part related to incomplete distention. Brachytherapy seed implants at prostate bed. Stomach/Bowel: Normal appendix. Segment of nonobstructed sigmoid colon extends into a LEFT inguinal hernia. No associated bowel wall thickening or obstruction. Mild sigmoid diverticulosis. Stomach collapsed, unable to exclude gastric wall thickening.  Small bowel loops normal appearance. Vascular/Lymphatic: Atherosclerotic calcifications aorta, iliac arteries, coronary arteries, abdominal branch arteries including the origins of the celiac and superior mesenteric arteries as well as RIGHT renal artery. Heart appears mildly enlarged. No adenopathy. Distended IVC and hepatic veins with reflux of contrast from RIGHT atrium into IVC and hepatic veins, in combination with delayed portal vein visualization raising question bold elevated RIGHT heart pressure and failure. Reproductive: N/A Other: Scattered ascites in abdomen and pelvis as well as within BILATERAL inguinal hernias LEFT larger than RIGHT. Scattered soft tissue edema diffusely including presacral space terrible question related to hypoproteinemia or anasarca. Musculoskeletal: Osseous demineralization with degenerative disc disease changes lumbar spine. Degenerative changes of both hips. No acute osseous findings. IMPRESSION: BILATERAL pleural effusions and ascites as well as scattered soft tissue edema, could be due to failure normal hyperproteinemia, or anasarca. Suspect tiny calcified gallstone. Nonobstructing LEFT renal calculus. BILATERAL inguinal hernias containing fat and fluid on RIGHT and containing ascites and nonobstructed sigmoid colon on LEFT. Sigmoid diverticulosis. Aortic atherosclerosis and mild coronary artery calcification with supected elevated RIGHT heart pressure. Electronically Signed   By: Lavonia Dana M.D.   On: 11/24/2015 15:27    Terianna Peggs, DO  [facility] Pager [phone]  If 7PM-7AM, please contact night-coverage [email or web address] Password [email or web address] 11/30/2015, 6:10 PM   LOS: 3 days

## 2015-11-30 NOTE — Anesthesia Postprocedure Evaluation (Signed)
Anesthesia Post Note  Patient: Aaron Lucas  Procedure(s) Performed: Procedure(s) (LRB): BILATERAL INGUINAL HERNIA REPAIR (Bilateral) INSERTION OF MESH (Bilateral)  Patient location during evaluation: PACU Anesthesia Type: General Level of consciousness: sedated and patient cooperative Pain management: pain level controlled Vital Signs Assessment: post-procedure vital signs reviewed and stable Respiratory status: spontaneous breathing Cardiovascular status: stable Anesthetic complications: no    Last Vitals:  Vitals:   11/30/15 1507 11/30/15 1948  BP: (!) 96/55 102/61  Pulse: 85 79  Resp:    Temp: 36.3 C 36.9 C    Last Pain:  Vitals:   11/30/15 1948  TempSrc: Oral  PainSc:                  Nolon Nations

## 2015-11-30 NOTE — Progress Notes (Signed)
2 Days Post-Op  Subjective: No complaints. Got confused last night  Objective: Vital signs in last 24 hours: Temp:  [98.6 F (37 C)-98.7 F (37.1 C)] 98.6 F (37 C) (08/19 0404) Pulse Rate:  [88-95] 88 (08/19 0404) Resp:  [16] 16 (08/19 0404) BP: (114-126)/(61-63) 114/61 (08/19 0404) SpO2:  [91 %-96 %] 96 % (08/19 0404) Weight:  [82.6 kg (182 lb)] 82.6 kg (182 lb) (08/19 0500) Last BM Date: 11/27/15  Intake/Output from previous day: No intake/output data recorded. Intake/Output this shift: No intake/output data recorded.  Resp: clear to auscultation bilaterally Cardio: regular rate and rhythm GI: soft, nontender. incisions ok  Lab Results:   Recent Labs  11/29/15 0223 11/30/15 0434  WBC 10.0 10.4  HGB 10.2* 11.1*  HCT 33.5* 36.6*  PLT 143* 159   BMET  Recent Labs  11/29/15 0223 11/30/15 0434  NA 130* 130*  K 3.9 4.2  CL 94* 92*  CO2 29 30  GLUCOSE 128* 110*  BUN 16 20  CREATININE 0.84 0.91  CALCIUM 8.9 9.0   PT/INR No results for input(s): LABPROT, INR in the last 72 hours. ABG No results for input(s): PHART, HCO3 in the last 72 hours.  Invalid input(s): PCO2, PO2  Studies/Results: No results found.  Anti-infectives: Anti-infectives    Start     Dose/Rate Route Frequency Ordered Stop   11/28/15 0700  clindamycin (CLEOCIN) IVPB 300 mg  Status:  Discontinued     300 mg 100 mL/hr over 30 Minutes Intravenous To ShortStay Surgical 11/27/15 1344 11/27/15 1345   11/28/15 0700  clindamycin (CLEOCIN) IVPB 300 mg     300 mg 100 mL/hr over 30 Minutes Intravenous To ShortStay Surgical 11/27/15 1345 11/28/15 0817      Assessment/Plan: s/p Procedure(s): BILATERAL INGUINAL HERNIA REPAIR (Bilateral) INSERTION OF MESH (Bilateral) Advance diet  Will get PT to see since he is newly using a walker Hopefully home soon  LOS: 3 days    TOTH III,PAUL S 11/30/2015

## 2015-11-30 NOTE — Evaluation (Signed)
Physical Therapy Evaluation Patient Details Name: Aaron Lucas MRN: [id number] DOB: November 09, 1928 Today's Date: 11/30/2015   History of Present Illness  Pt adm for hernia repair.  PMH - aortic stenosis PAF, prostate cancer, hypertension  Clinical Impression  Pt doing well with mobility and no further acute PT needed.  Ready for dc home with wife from PT standpoint. Follow up with HHPT.      Follow Up Recommendations Home health PT;Supervision - Intermittent    Equipment Recommendations  None recommended by PT    Recommendations for Other Services       Precautions / Restrictions Precautions Precautions: None Restrictions Weight Bearing Restrictions: No      Mobility  Bed Mobility               General bed mobility comments: Pt up in chair  Transfers Overall transfer level: Needs assistance Equipment used: Rolling walker (2 wheeled) Transfers: Sit to/from Stand Sit to Stand: Supervision         General transfer comment: For safety only. No physical assist  Ambulation/Gait Ambulation/Gait assistance: Supervision Ambulation Distance (Feet): 175 Feet Assistive device: Rolling walker (2 wheeled) Gait Pattern/deviations: Step-through pattern;Decreased stride length;Trunk flexed     General Gait Details: Verbal cues to stand more erect  Stairs            Wheelchair Mobility    Modified Rankin (Stroke Patients Only)       Balance Overall balance assessment: Needs assistance Sitting-balance support: No upper extremity supported;Feet supported Sitting balance-Leahy Scale: Good     Standing balance support: No upper extremity supported;During functional activity Standing balance-Leahy Scale: Fair                               Pertinent Vitals/Pain Pain Assessment: Faces Faces Pain Scale: Hurts a little bit Pain Location: groin Pain Descriptors / Indicators: Sore Pain Intervention(s): Limited activity within patient's  tolerance;Monitored during session;Ice applied    Home Living Family/patient expects to be discharged to:: Private residence Living Arrangements: Spouse/significant other Available Help at Discharge: Family;Available 24 hours/day Type of Home: House Home Access: Stairs to enter   [employer] of Steps: 1 Home Layout: One level Home Equipment: Walker - 2 wheels (walking stick)      Prior Function Level of Independence: Independent with assistive device(s)         Comments: Uses walking stick     Hand Dominance        Extremity/Trunk Assessment   Upper Extremity Assessment: Overall WFL for tasks assessed           Lower Extremity Assessment: Overall WFL for tasks assessed         Communication   Communication: No difficulties  Cognition Arousal/Alertness: Awake/alert Behavior During Therapy: WFL for tasks assessed/performed Overall Cognitive Status: Within Functional Limits for tasks assessed                      General Comments      Exercises        Assessment/Plan    PT Assessment All further PT needs can be met in the next venue of care  PT Diagnosis Difficulty walking   PT Problem List Decreased balance;Decreased knowledge of use of DME;Decreased mobility  PT Treatment Interventions     PT Goals (Current goals can be found in the Care Plan section) Acute Rehab PT Goals Patient Stated Goal: return home PT  Goal Formulation: All assessment and education complete, DC therapy    Frequency     Barriers to discharge        Co-evaluation               End of Session Equipment Utilized During Treatment: Gait belt Activity Tolerance: Patient tolerated treatment well Patient left: in chair;with call bell/phone within reach;with family/visitor present Nurse Communication: Mobility status         Time: 8828-0034 PT Time Calculation (min) (ACUTE ONLY): 29 min   Charges:   PT Evaluation $PT Eval Moderate Complexity:  1 Procedure PT Treatments $Gait Training: 8-22 mins   PT G Codes:        Loral Campi 12-28-2015, 2:07 PM [employer] PT [phone]

## 2015-11-30 NOTE — Progress Notes (Signed)
Patient complained of having difficulty falling asleep.Patient requested to have med to  Help sleep. Pt. Given Trazodone 25 mg.  Patient requested to be transfered to chair. Patient is sitting in chair resting.

## 2015-12-01 ENCOUNTER — Encounter (HOSPITAL_COMMUNITY): Payer: Self-pay

## 2015-12-01 LAB — CBC
HCT: 35 % — ABNORMAL LOW (ref 39.0–52.0)
HEMOGLOBIN: 10.9 g/dL — AB (ref 13.0–17.0)
MCH: 27.1 pg (ref 26.0–34.0)
MCHC: 31.1 g/dL (ref 30.0–36.0)
MCV: 87.1 fL (ref 78.0–100.0)
PLATELETS: 135 10*3/uL — AB (ref 150–400)
RBC: 4.02 MIL/uL — ABNORMAL LOW (ref 4.22–5.81)
RDW: 15.4 % (ref 11.5–15.5)
WBC: 9.3 10*3/uL (ref 4.0–10.5)

## 2015-12-01 LAB — URINE CULTURE: CULTURE: NO GROWTH

## 2015-12-01 LAB — COMPREHENSIVE METABOLIC PANEL
ALBUMIN: 3 g/dL — AB (ref 3.5–5.0)
ALK PHOS: 66 U/L (ref 38–126)
ALT: 19 U/L (ref 17–63)
ANION GAP: 11 (ref 5–15)
AST: 21 U/L (ref 15–41)
BUN: 20 mg/dL (ref 6–20)
CHLORIDE: 90 mmol/L — AB (ref 101–111)
CO2: 30 mmol/L (ref 22–32)
Calcium: 9 mg/dL (ref 8.9–10.3)
Creatinine, Ser: 0.76 mg/dL (ref 0.61–1.24)
GFR calc non Af Amer: >60 mL/min (ref >60)
GLUCOSE: 104 mg/dL — AB (ref 65–99)
POTASSIUM: 3.7 mmol/L (ref 3.5–5.1)
SODIUM: 131 mmol/L — AB (ref 135–145)
Total Bilirubin: 1.4 mg/dL — ABNORMAL HIGH (ref 0.3–1.2)
Total Protein: 6.1 g/dL — ABNORMAL LOW (ref 6.5–8.1)

## 2015-12-01 MED ORDER — LISINOPRIL 2.5 MG PO TABS: 2.5000 mg | ORAL_TABLET | Freq: Every day | ORAL | 0 refills | Status: DC

## 2015-12-01 MED ORDER — FUROSEMIDE 40 MG PO TABS: 40.0000 mg | ORAL_TABLET | Freq: Every day | ORAL | 0 refills | Status: DC

## 2015-12-01 NOTE — Plan of Care (Signed)
Problem: Physical Regulation: Goal: Will remain free from infection Outcome: Progressing No S/S of infection noted, VS WNL  Problem: Skin Integrity: Goal: Risk for impaired skin integrity will decrease Outcome: Progressing No skin intact no bed sores noted, incisions to both groins with [employer] dressings with old dry blood  Problem: Tissue Perfusion: Goal: Risk factors for ineffective tissue perfusion will decrease Outcome: Progressing No s/s of dvt noted, bilateral lower extremities with +1 non pitting edema  Problem: Activity: Goal: Risk for activity intolerance will decrease Outcome: Progressing Patient sat in the recliner for hours and tolerated well  Problem: Bowel/Gastric: Goal: Will not experience complications related to bowel motility Outcome: Progressing No gastric or bowel issues reported, last BM 11/30/2015

## 2015-12-01 NOTE — Progress Notes (Signed)
3 Days Post-Op  Subjective: Feels well  Objective: Vital signs in last 24 hours: Temp:  [97.4 F (36.3 C)-98.5 F (36.9 C)] 98.4 F (36.9 C) (08/20 [id number]) Pulse Rate:  [79-85] 81 (08/20 0632) BP: (93-102)/(43-61) 93/43 (08/20 0632) SpO2:  [90 %-99 %] 98 % (08/20 [id number]) Weight:  [83.3 kg (183 lb 11.2 oz)] 83.3 kg (183 lb 11.2 oz) (08/19 1333) Last BM Date: 11/30/15  Intake/Output from previous day: 08/19 0701 - 08/20 0700 In: 240 [P.O.:240] Out: 225 [Urine:225] Intake/Output this shift: No intake/output data recorded.  GI: soft, non-tender; bowel sounds normal; no masses,  no organomegaly and incisions c/d/i  Lab Results:   Recent Labs  11/30/15 0434 12/01/15 0323  WBC 10.4 9.3  HGB 11.1* 10.9*  HCT 36.6* 35.0*  PLT 159 135*   BMET  Recent Labs  11/30/15 0434 12/01/15 0323  NA 130* 131*  K 4.2 3.7  CL 92* 90*  CO2 30 30  GLUCOSE 110* 104*  BUN 20 20  CREATININE 0.91 0.76  CALCIUM 9.0 9.0   PT/INR No results for input(s): LABPROT, INR in the last 72 hours. ABG No results for input(s): PHART, HCO3 in the last 72 hours.  Invalid input(s): PCO2, PO2  Studies/Results: No results found.  Anti-infectives: Anti-infectives    Start     Dose/Rate Route Frequency Ordered Stop   11/28/15 0700  clindamycin (CLEOCIN) IVPB 300 mg  Status:  Discontinued     300 mg 100 mL/hr over 30 Minutes Intravenous To ShortStay Surgical 11/27/15 1344 11/27/15 1345   11/28/15 0700  clindamycin (CLEOCIN) IVPB 300 mg     300 mg 100 mL/hr over 30 Minutes Intravenous To ShortStay Surgical 11/27/15 1345 11/28/15 0817      Assessment/Plan: s/p Procedure(s): BILATERAL INGUINAL HERNIA REPAIR (Bilateral) INSERTION OF MESH (Bilateral) Home today F/u in 2 weeks for wound check   LOS: 4 days    Rosario Jacks., Anne Hahn 12/01/2015

## 2015-12-01 NOTE — Discharge Summary (Signed)
Physician Discharge Summary  Aaron Lucas [id number] DOB: July 24, 1928 DOA: 11/24/2015  PCP: Geoffery Lyons, MD  Admit date: 11/24/2015 Discharge date: 12/01/2015  Admitted From: Home Disposition:  Home  Recommendations for Outpatient Follow-up:  1. Follow up with PCP in 1-2 weeks 2. Follow up with Dr. Percival Spanish in 1 week   Home Health: HHPT Equipment/Devices:none  Discharge Condition:stable CODE STATUS: DNR Diet recommendation: Heart Healthy   Brief/Interim Summary: 82 y.o.malewith a medical history significant for, but not limited to aortic stenosisPAF, prostate cancer, hypertension. Patient was in the emergency department 2 days ago with left inguinal bulge and discomfort. Patient was seen at [facility] surgery in the emergency department where a left inguinal hernia was reduced and patient discharged with plans for outpatient follow-up. Patient return to emergency department 8/80for recurrent bulge and pain. The patient was seen by general surgery and recommended herniorrhaphy when cleared by cardiology. Echocardiogram showed EF 45-50 percent, grade 3 DD with severe as and severe MR. Although the patient was high risk for surgery, the patient and wife understood and accepted this risk.  Discharge Diagnoses:  Left inguinal hernia -reduced two days PTA but re-herniated with associated pain.  -11/26/15--CT scan showssegment of non-obstructed sigmoid colon extending into the hernia. No bowel wall thickening or obstruction seen on image studies. -high risk for surgery given medical history and age. -Appreciate surgery's assistance.  -11/28/15--herniorrhaphy -11/29/15--discussed with general surgery--ok to start pradaxa-->Hgb remained stable   Acute on chronic systolic and diastolic CHF-- Severe AS/MR -not good candidate for TAVR or surgical AVR -11/26/2015 echo EF 45-50 percent, grade 3 DD, severe AS, MR -continue diuretics as dictated by cardiology -follow  daily wts and intake and output -NEG 2.6L before surgery -I/O not accurate since then -appears more euvolemic -switch to po lasix--home with 40 mg po daily -stable on RA  Hypertension,  -soft currently - held home ACEI-->restart at 2.5 mg daily -monitor VS -continue diuresis as recommended by cardiology   Atrial fibrillation,  -rate controlled.  -Chadsvasc 3. On Pradaxa.  -11/29/15--restart pradaxa  Hyponatremia.  -Secondary to fluid overload/CHF -appears to be chronicdating back to July 2012 with ranges of 128-135 -Improving/stablewith diuresis -continue diuretics per cardiology -Patient states that his furosemide home dose increased to 40 mg daily for the past 2 weeks  Abnormal u/a - + WBC -send for culture -no dysuria, no fever, no nausea or vomiting -will hold on abx's for now    Discharge Instructions  Discharge Instructions    Diet - low sodium heart healthy    Complete by:  As directed   Increase activity slowly    Complete by:  As directed       Medication List    TAKE these medications   acetaminophen 500 MG tablet Commonly known as:  TYLENOL Take 500 mg by mouth daily as needed for mild pain.   CLEOCIN-T 1 % gel Generic drug:  clindamycin Apply 1 application topically daily as needed (for skin lesions).   dabigatran 150 MG Caps capsule Commonly known as:  PRADAXA Take 1 capsule (150 mg total) by mouth every 12 (twelve) hours.   FISH OIL PO Take 1 capsule by mouth daily.   furosemide 40 MG tablet Commonly known as:  LASIX Take 1 tablet (40 mg total) by mouth daily. What changed:  when to take this   lisinopril 2.5 MG tablet Commonly known as:  PRINIVIL Take 1 tablet (2.5 mg total) by mouth daily. What changed:  medication strength  how much  to take   loratadine 10 MG tablet Commonly known as:  CLARITIN Take 10 mg by mouth daily as needed for allergies.   metroNIDAZOLE 0.75 % cream Commonly known as:   METROCREAM Apply 1 application topically 2 (two) times daily as needed (for skin lesions).   multivitamin with minerals tablet Take 1 tablet by mouth daily.   NASACORT ALLERGY 24HR 55 MCG/ACT Aero nasal inhaler Generic drug:  triamcinolone Place 1 spray into both nostrils daily as needed (for congestion).   omeprazole 40 MG capsule Commonly known as:  PRILOSEC Take 40 mg by mouth daily.   RED WINE EXTRACT PO Take 1 tablet by mouth 2 (two) times a week.   vitamin C 500 MG tablet Commonly known as:  ASCORBIC ACID Take 500 mg by mouth daily.       Allergies  Allergen Reactions  . Ambien [Zolpidem Tartrate] Other (See Comments)    Per patient and wife "hallucinations and sleep walking" (tried to climb out of the windows in the middle of the night)  . Penicillins Swelling and Rash    SWELLING REACTION UNSPECIFIED  Has patient had a PCN reaction causing immediate rash, facial/tongue/throat swelling, SOB or lightheadedness with hypotension: unknown Has patient had a PCN reaction causing severe rash involving mucus membranes or skin necrosis: No Has patient had a PCN reaction that required hospitalization No Has patient had a PCN reaction occurring within the last 10 years: No    . Diamox [Acetazolamide] Other (See Comments)    UNSPECIFIED   . Other Other (See Comments)    Nuts, pecans only--unknown  . Ciprofloxacin Rash  . Doxycycline Rash  . Erythromycin Rash  . Neptazane [Methazolamide] Other (See Comments)    UNSPECIFIED     Consultations:  Cardiology  General surgery   Procedures/Studies: Ct Abdomen Pelvis W Contrast  Result Date: 11/24/2015 CLINICAL DATA:  LEFT lower quadrant abdominal pain radiating to groin intermittently for 4-5 weeks, LEFT inguinal hernia, history valvular heart disease, atrial fibrillation, prostate cancer, diverticulosis, hypertension EXAM: CT ABDOMEN AND PELVIS WITH CONTRAST TECHNIQUE: Multidetector CT imaging of the abdomen and pelvis  was performed using the standard protocol following bolus administration of intravenous contrast. Sagittal and coronal MPR images reconstructed from axial data set. CONTRAST:  187mL ISOVUE-300 IOPAMIDOL (ISOVUE-300) INJECTION 61% IV. Dilute oral contrast. COMPARISON:  07/01/2015 FINDINGS: Lower chest: BILATERAL pleural effusions and minimal compressive atelectasis lower lobes. Fell Hepatobiliary: Suspect tiny dependent calcified gallstone in gallbladder. Liver unremarkable Pancreas: Normal appearance Spleen: Normal appearance Adrenals/Urinary Tract: Adrenal glands normal appearance. Nonobstructing LEFT renal calculus. No renal mass, hydronephrosis or hydroureter. Mild bladder wall thickening question in part related to incomplete distention. Brachytherapy seed implants at prostate bed. Stomach/Bowel: Normal appendix. Segment of nonobstructed sigmoid colon extends into a LEFT inguinal hernia. No associated bowel wall thickening or obstruction. Mild sigmoid diverticulosis. Stomach collapsed, unable to exclude gastric wall thickening. Small bowel loops normal appearance. Vascular/Lymphatic: Atherosclerotic calcifications aorta, iliac arteries, coronary arteries, abdominal branch arteries including the origins of the celiac and superior mesenteric arteries as well as RIGHT renal artery. Heart appears mildly enlarged. No adenopathy. Distended IVC and hepatic veins with reflux of contrast from RIGHT atrium into IVC and hepatic veins, in combination with delayed portal vein visualization raising question bold elevated RIGHT heart pressure and failure. Reproductive: N/A Other: Scattered ascites in abdomen and pelvis as well as within BILATERAL inguinal hernias LEFT larger than RIGHT. Scattered soft tissue edema diffusely including presacral space terrible question related to hypoproteinemia or  anasarca. Musculoskeletal: Osseous demineralization with degenerative disc disease changes lumbar spine. Degenerative changes of  both hips. No acute osseous findings. IMPRESSION: BILATERAL pleural effusions and ascites as well as scattered soft tissue edema, could be due to failure normal hyperproteinemia, or anasarca. Suspect tiny calcified gallstone. Nonobstructing LEFT renal calculus. BILATERAL inguinal hernias containing fat and fluid on RIGHT and containing ascites and nonobstructed sigmoid colon on LEFT. Sigmoid diverticulosis. Aortic atherosclerosis and mild coronary artery calcification with supected elevated RIGHT heart pressure. Electronically Signed   By: Lavonia Dana M.D.   On: 11/24/2015 15:27        Discharge Exam: Vitals:   11/30/15 1948 12/01/15 0632  BP: 102/61 (!) 93/43  Pulse: 79 81  Resp:    Temp: 98.5 F (36.9 C) 98.4 F (36.9 C)   Vitals:   11/30/15 1333 11/30/15 1507 11/30/15 1948 12/01/15 0632  BP:  (!) 96/55 102/61 (!) 93/43  Pulse:  85 79 81  Resp:      Temp:  97.4 F (36.3 C) 98.5 F (36.9 C) 98.4 F (36.9 C)  TempSrc:  Oral Oral Oral  SpO2:  99% 90% 98%  Weight: 83.3 kg (183 lb 11.2 oz)       General: Pt is alert, awake, not in acute distress Cardiovascular: RRR, S1/S2 +, no rubs, no gallops Respiratory: CTA bilaterally, no wheezing, no rhonchi Abdominal: Soft, NT, ND, bowel sounds + Extremities: no edema, no cyanosis   The results of significant diagnostics from this hospitalization (including imaging, microbiology, ancillary and laboratory) are listed below for reference.    Significant Diagnostic Studies: Ct Abdomen Pelvis W Contrast  Result Date: 11/24/2015 CLINICAL DATA:  LEFT lower quadrant abdominal pain radiating to groin intermittently for 4-5 weeks, LEFT inguinal hernia, history valvular heart disease, atrial fibrillation, prostate cancer, diverticulosis, hypertension EXAM: CT ABDOMEN AND PELVIS WITH CONTRAST TECHNIQUE: Multidetector CT imaging of the abdomen and pelvis was performed using the standard protocol following bolus administration of intravenous  contrast. Sagittal and coronal MPR images reconstructed from axial data set. CONTRAST:  129mL ISOVUE-300 IOPAMIDOL (ISOVUE-300) INJECTION 61% IV. Dilute oral contrast. COMPARISON:  07/01/2015 FINDINGS: Lower chest: BILATERAL pleural effusions and minimal compressive atelectasis lower lobes. Fell Hepatobiliary: Suspect tiny dependent calcified gallstone in gallbladder. Liver unremarkable Pancreas: Normal appearance Spleen: Normal appearance Adrenals/Urinary Tract: Adrenal glands normal appearance. Nonobstructing LEFT renal calculus. No renal mass, hydronephrosis or hydroureter. Mild bladder wall thickening question in part related to incomplete distention. Brachytherapy seed implants at prostate bed. Stomach/Bowel: Normal appendix. Segment of nonobstructed sigmoid colon extends into a LEFT inguinal hernia. No associated bowel wall thickening or obstruction. Mild sigmoid diverticulosis. Stomach collapsed, unable to exclude gastric wall thickening. Small bowel loops normal appearance. Vascular/Lymphatic: Atherosclerotic calcifications aorta, iliac arteries, coronary arteries, abdominal branch arteries including the origins of the celiac and superior mesenteric arteries as well as RIGHT renal artery. Heart appears mildly enlarged. No adenopathy. Distended IVC and hepatic veins with reflux of contrast from RIGHT atrium into IVC and hepatic veins, in combination with delayed portal vein visualization raising question bold elevated RIGHT heart pressure and failure. Reproductive: N/A Other: Scattered ascites in abdomen and pelvis as well as within BILATERAL inguinal hernias LEFT larger than RIGHT. Scattered soft tissue edema diffusely including presacral space terrible question related to hypoproteinemia or anasarca. Musculoskeletal: Osseous demineralization with degenerative disc disease changes lumbar spine. Degenerative changes of both hips. No acute osseous findings. IMPRESSION: BILATERAL pleural effusions and ascites  as well as scattered soft tissue edema, could be  due to failure normal hyperproteinemia, or anasarca. Suspect tiny calcified gallstone. Nonobstructing LEFT renal calculus. BILATERAL inguinal hernias containing fat and fluid on RIGHT and containing ascites and nonobstructed sigmoid colon on LEFT. Sigmoid diverticulosis. Aortic atherosclerosis and mild coronary artery calcification with supected elevated RIGHT heart pressure. Electronically Signed   By: Lavonia Dana M.D.   On: 11/24/2015 15:27     Microbiology: Recent Results (from the past 240 hour(s))  Surgical pcr screen     Status: None   Collection Time: 11/27/15 10:01 PM  Result Value Ref Range Status   MRSA, PCR NEGATIVE NEGATIVE Final   Staphylococcus aureus NEGATIVE NEGATIVE Final    Comment:        The Xpert SA Assay (FDA approved for NASAL specimens in patients over 59 years of age), is one component of a comprehensive surveillance program.  Test performance has been validated by [facility] for patients greater than or equal to 31 year old. It is not intended to diagnose infection nor to guide or monitor treatment.      Labs: Basic Metabolic Panel:  Recent Labs Lab 11/27/15 0737 11/28/15 0524 11/29/15 0223 11/30/15 0434 12/01/15 0323  NA 133* 131* 130* 130* 131*  K 3.9 3.6 3.9 4.2 3.7  CL 94* 94* 94* 92* 90*  CO2 28 29 29 30 30   GLUCOSE 101* 98 128* 110* 104*  BUN 19 17 16 20 20   CREATININE 0.93 0.86 0.84 0.91 0.76  CALCIUM 9.0 8.7* 8.9 9.0 9.0  MG  --  1.9  --   --   --    Liver Function Tests:  Recent Labs Lab 12/01/15 0323  AST 21  ALT 19  ALKPHOS 66  BILITOT 1.4*  PROT 6.1*  ALBUMIN 3.0*   No results for input(s): LIPASE, AMYLASE in the last 168 hours. No results for input(s): AMMONIA in the last 168 hours. CBC:  Recent Labs Lab 11/24/15 1312 11/29/15 0223 11/30/15 0434 12/01/15 0323  WBC 8.0 10.0 10.4 9.3  NEUTROABS 5.8  --   --   --   HGB 11.5* 10.2* 11.1* 10.9*  HCT 36.0* 33.5*  36.6* 35.0*  MCV 85.7 86.8 87.1 87.1  PLT 197 143* 159 135*   Cardiac Enzymes: No results for input(s): CKTOTAL, CKMB, CKMBINDEX, TROPONINI in the last 168 hours. BNP: Invalid input(s): POCBNP CBG: No results for input(s): GLUCAP in the last 168 hours.  Time coordinating discharge:  Greater than 30 minutes  Signed:  Elinor Kleine, DO [facility] Pager: [phone] 12/01/2015, 10:08 AM

## 2015-12-08 ENCOUNTER — Emergency Department (HOSPITAL_COMMUNITY)
Admission: EM | Admit: 2015-12-08 | Discharge: 2015-12-08 | Disposition: A | Discharge status: Home / Self Care | Payer: Medicare Other | Attending: Emergency Medicine | Admitting: Emergency Medicine

## 2015-12-08 ENCOUNTER — Emergency Department (HOSPITAL_COMMUNITY): Payer: Medicare Other

## 2015-12-08 ENCOUNTER — Encounter (HOSPITAL_COMMUNITY): Payer: Self-pay | Admitting: Emergency Medicine

## 2015-12-08 DIAGNOSIS — Z79899 Other long term (current) drug therapy: Secondary | ICD-10-CM | POA: Diagnosis not present

## 2015-12-08 DIAGNOSIS — E871 Hypo-osmolality and hyponatremia: Secondary | ICD-10-CM | POA: Diagnosis not present

## 2015-12-08 DIAGNOSIS — Z8546 Personal history of malignant neoplasm of prostate: Secondary | ICD-10-CM | POA: Diagnosis not present

## 2015-12-08 DIAGNOSIS — I110 Hypertensive heart disease with heart failure: Secondary | ICD-10-CM | POA: Insufficient documentation

## 2015-12-08 DIAGNOSIS — Z8551 Personal history of malignant neoplasm of bladder: Secondary | ICD-10-CM | POA: Diagnosis not present

## 2015-12-08 DIAGNOSIS — I5043 Acute on chronic combined systolic (congestive) and diastolic (congestive) heart failure: Secondary | ICD-10-CM | POA: Insufficient documentation

## 2015-12-08 DIAGNOSIS — Z7901 Long term (current) use of anticoagulants: Secondary | ICD-10-CM | POA: Diagnosis not present

## 2015-12-08 DIAGNOSIS — R002 Palpitations: Secondary | ICD-10-CM | POA: Diagnosis present

## 2015-12-08 LAB — CBC
HCT: 35.8 % — ABNORMAL LOW (ref 39.0–52.0)
Hemoglobin: 11.3 g/dL — ABNORMAL LOW (ref 13.0–17.0)
MCH: 27.2 pg (ref 26.0–34.0)
MCHC: 31.6 g/dL (ref 30.0–36.0)
MCV: 86.1 fL (ref 78.0–100.0)
PLATELETS: 220 10*3/uL (ref 150–400)
RBC: 4.16 MIL/uL — ABNORMAL LOW (ref 4.22–5.81)
RDW: 15.8 % — ABNORMAL HIGH (ref 11.5–15.5)
WBC: 8.9 10*3/uL (ref 4.0–10.5)

## 2015-12-08 LAB — BASIC METABOLIC PANEL
Anion gap: 10 (ref 5–15)
BUN: 11 mg/dL (ref 6–20)
CALCIUM: 9.1 mg/dL (ref 8.9–10.3)
CO2: 28 mmol/L (ref 22–32)
CREATININE: 0.92 mg/dL (ref 0.61–1.24)
Chloride: 88 mmol/L — ABNORMAL LOW (ref 101–111)
GFR calc Af Amer: >60 mL/min (ref >60)
GFR calc non Af Amer: >60 mL/min (ref >60)
GLUCOSE: 121 mg/dL — AB (ref 65–99)
Potassium: 4.6 mmol/L (ref 3.5–5.1)
Sodium: 126 mmol/L — ABNORMAL LOW (ref 135–145)

## 2015-12-08 LAB — MAGNESIUM: MAGNESIUM: 1.8 mg/dL (ref 1.7–2.4)

## 2015-12-08 MED ORDER — SODIUM CHLORIDE 0.9 % IV BOLUS (SEPSIS): 1000.0000 mL | Freq: Once | INTRAVENOUS | Status: DC

## 2015-12-08 MED ORDER — SODIUM CHLORIDE 0.9 % IV BOLUS (SEPSIS)
500.0000 mL | Freq: Once | INTRAVENOUS | Status: AC
  Administered 2015-12-08: 500 mL via INTRAVENOUS

## 2015-12-08 NOTE — ED Provider Notes (Signed)
[facility] DEPT Provider Note   CSN: [id number] Arrival date & time: 12/08/15  0420     History   Chief Complaint Chief Complaint  Patient presents with  . Palpitations    HPI Aaron Lucas is a 82 y.o. male.  82 yo M with a cc of palpitations.  Happened about 2 hours ago.  Lasted for 5 seconds and then resolved.  Some sob with this. Denies exertional symptoms.  Currently asymptomatic.  Patient had a hernia repair about a week ago.  The discharge instructions it said to come to the ED immediately if you have sob.  Patient denies current symptoms.  Denies cp or sob prior to this episode. Denies leg swelling above baseline.    The history is provided by the patient.  Palpitations   This is a new problem. The current episode started 1 to 2 hours ago. The problem occurs rarely. The problem has been resolved. On average, each episode lasts 5 seconds. Associated symptoms include shortness of breath. Pertinent negatives include no fever, no chest pain, no abdominal pain, no vomiting and no headaches. He has tried nothing for the symptoms. The treatment provided no relief.    Past Medical History:  Diagnosis Date  . Cataracts, bilateral   . Chronic diastolic CHF (congestive heart failure) ([place]) 11/25/2015  . Detached retina Twice   As a result he is lost vision in the left eye.  . Diverticulosis 2006  . Hx of colonic polyps 2006,  2011  . Hypertension   . Paroxysmal atrial fibrillation (HCC)    On Pradaxa.   . Prostate cancer ([place]) 2004   seed implant  . 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 Valvular heart disease    MV regurge and Ao stenosis  . Vertigo     Patient Active Problem List   Diagnosis Date Noted  . CHF (congestive heart failure) ([place]) 11/28/2015  . Acute on chronic combined systolic and diastolic CHF (congestive heart failure) ([place]) 11/27/2015  . Preoperative clearance 11/25/2015  . Acute on chronic diastolic heart failure ([place])  11/25/2015  . Hernia of abdominal cavity   . Left inguinal hernia 11/24/2015  . Hyponatremia 11/24/2015  . Abnormal urinalysis 11/24/2015  . Severe aortic valve stenosis   . Aortic stenosis 03/15/2015  . Lower GI bleeding 11/13/2014  . Melena 11/13/2014  . Hypertension   . Paroxysmal atrial fibrillation (HCC)   . Mitral regurgitation     Past Surgical History:  Procedure Laterality Date  . CARDIAC CATHETERIZATION N/A 04/19/2015   Procedure: Right/Left Heart Cath and Coronary Angiography;  Surgeon: Burnell Blanks, MD;  Location: [facility];  Service: Cardiovascular;  Laterality: N/A;  . CATARACT EXTRACTION    . COLONOSCOPY  2006 and 11/2009   tubular adenomas on both studies. int rrhoids, diverticulosis.   . COLONOSCOPY N/A 11/15/2014   Procedure: COLONOSCOPY;  Surgeon: Milus Banister, MD;  Location: [facility];  Service: Endoscopy;  Laterality: N/A;  . 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 Focal urothelial atypia in 2006.  Marland Kitchen ESOPHAGOGASTRODUODENOSCOPY N/A 11/15/2014   Procedure: ESOPHAGOGASTRODUODENOSCOPY (EGD);  Surgeon: Milus Banister, MD;  Location: [facility];  Service: Endoscopy;  Laterality: N/A;  . INGUINAL HERNIA REPAIR Bilateral 11/28/2015   Procedure: BILATERAL INGUINAL HERNIA REPAIR;  Surgeon: Erroll Luna, MD;  Location: [facility];  Service: General;  Laterality: Bilateral;  . INSERTION OF MESH  Bilateral 11/28/2015   Procedure: INSERTION OF MESH;  Surgeon: Erroll Luna, MD;  Location: [facility];  Service: General;  Laterality: Bilateral;  . PLEURAL EFFUSION DRAINAGE  2010  . RADIOACTIVE SEED IMPLANT  2004   For treatment of prostate cancer  . SQUAMOUS CELL CARCINOMA EXCISION  2002, 2003, 2006.  . TEE WITHOUT CARDIOVERSION N/A 05/14/2015   Procedure: TRANSESOPHAGEAL ECHOCARDIOGRAM (TEE);  Surgeon: Larey Dresser, MD;  Location: [facility];  Service: Cardiovascular;  Laterality: N/A;    . THORACENTESIS    . TONSILLECTOMY         Home Medications    Prior to Admission medications   Medication Sig Start Date End Date Taking? Authorizing Provider  acetaminophen (TYLENOL) 500 MG tablet Take 500 mg by mouth daily as needed for mild pain.   Yes Historical Provider, MD  clindamycin (CLEOCIN-T) 1 % gel Apply 1 application topically daily as needed (for skin lesions).    Yes Historical Provider, MD  dabigatran (PRADAXA) 150 MG CAPS capsule Take 1 capsule (150 mg total) by mouth every 12 (twelve) hours. 11/14/15  Yes Minus Breeding, MD  furosemide (LASIX) 40 MG tablet Take 1 tablet (40 mg total) by mouth daily. 12/01/15  Yes Orson Eva, MD  lisinopril (PRINIVIL,ZESTRIL) 2.5 MG tablet Take 1 tablet (2.5 mg total) by mouth daily. 12/01/15  Yes Orson Eva, MD  loratadine (CLARITIN) 10 MG tablet Take 10 mg by mouth daily as needed for allergies.    Yes Historical Provider, MD  metroNIDAZOLE (METROCREAM) 0.75 % cream Apply 1 application topically 2 (two) times daily as needed (for skin lesions).    Yes Historical Provider, MD  Misc Natural Products (RED WINE EXTRACT PO) Take 1 tablet by mouth 2 (two) times a week.   Yes Historical Provider, MD  Multiple Vitamins-Minerals (MULTIVITAMIN WITH MINERALS) tablet Take 1 tablet by mouth daily.   Yes Historical Provider, MD  Omega-3 Fatty Acids (FISH OIL PO) Take 1 capsule by mouth daily.    Yes Historical Provider, MD  omeprazole (PRILOSEC) 40 MG capsule Take 40 mg by mouth daily. 12/18/14  Yes Historical Provider, MD  triamcinolone (NASACORT ALLERGY 24HR) 55 MCG/ACT AERO nasal inhaler Place 1 spray into both nostrils daily as needed (for congestion).    Yes Historical Provider, MD  vitamin C (ASCORBIC ACID) 500 MG tablet Take 500 mg by mouth daily.   Yes Historical Provider, MD    Family History Family History  Problem Relation Age of Onset  . Other Father 27     of a PE after surgery  . Cancer - Other Mother   . Other       there is no known  coronary artery disease     Social History Social History  Substance Use Topics  . Smoking status: Never Smoker  . Smokeless tobacco: Never Used  . Alcohol use Yes     Comment: occassionally     Allergies   Ambien [zolpidem tartrate]; Penicillins; Diamox [acetazolamide]; Other; Ciprofloxacin; Doxycycline; Erythromycin; and Neptazane [methazolamide]   Review of Systems Review of Systems  Constitutional: Negative for chills and fever.  HENT: Negative for congestion and facial swelling.   Eyes: Negative for discharge and visual disturbance.  Respiratory: Positive for shortness of breath.   Cardiovascular: Positive for palpitations. Negative for chest pain.  Gastrointestinal: Negative for abdominal pain, diarrhea and vomiting.  Musculoskeletal: Negative for arthralgias and myalgias.  Skin: Negative for color change and rash.  Neurological: Negative for tremors, syncope and headaches.  Psychiatric/Behavioral: Negative for confusion and dysphoric mood.     Physical Exam Updated Vital Signs BP 105/61   Pulse (!) 43   SpO2 97%   Physical Exam  Constitutional: He is oriented to person, place, and time. He appears well-developed and well-nourished.  HENT:  Head: Normocephalic and atraumatic.  Eyes: Conjunctivae and EOM are normal. Pupils are equal, round, and reactive to light.  Neck: Normal range of motion. No JVD present.  Cardiovascular: Normal rate and regular rhythm.   Pulmonary/Chest: Effort normal. No stridor. No respiratory distress.  Abdominal: He exhibits no distension. There is no tenderness. There is no guarding.  Musculoskeletal: Normal range of motion. Edema: 2+ RLE, 1+ LLE.  Neurological: He is alert and oriented to person, place, and time.  Skin: Skin is warm and dry.  Psychiatric: He has a normal mood and affect. His behavior is normal.     ED Treatments / Results  Labs (all labs ordered are listed, but only abnormal results are displayed) Labs Reviewed   BASIC METABOLIC PANEL - Abnormal; Notable for the following:       Result Value   Sodium 126 (*)    Chloride 88 (*)    Glucose, Bld 121 (*)    All other components within normal limits  CBC - Abnormal; Notable for the following:    RBC 4.16 (*)    Hemoglobin 11.3 (*)    HCT 35.8 (*)    RDW 15.8 (*)    All other components within normal limits  MAGNESIUM    EKG  EKG Interpretation  Date/Time:  Sunday December 08 2015 04:32:43 EDT Ventricular Rate:  85 PR Interval:    QRS Duration: 104 QT Interval:  424 QTC Calculation: 505 R Axis:   30 Text Interpretation:  Sinus rhythm Ventricular premature complex Abnormal R-wave progression, early transition Probable left ventricular hypertrophy Prolonged QT interval No significant change since last tracing Confirmed by Neiman Roots MD, DANIEL [id number]) on 12/08/2015 5:24:10 AM       Radiology Dg Chest 2 View  Result Date: 12/08/2015 CLINICAL DATA:  Heart palpitations. History of hypertension, prostate cancer. EXAM: CHEST  2 VIEW COMPARISON:  Chest radiograph January 08, 2016 FINDINGS: Cardiac silhouette is mildly enlarged unchanged. Calcified aortic knob. Mild pulmonary vascular congestion. Small posterior pleural effusion. No pneumothorax. Old LEFT rib fractures. Old LEFT clavicle fracture. Osteopenia. IMPRESSION: Mild cardiomegaly and vascular congestion. Small posterior pleural effusion. Electronically Signed   By: Elon Alas M.D.   On: 12/08/2015 05:55    Procedures Procedures (including critical care time)  Medications Ordered in ED Medications  sodium chloride 0.9 % bolus 500 mL (not administered)     Initial Impression / Assessment and Plan / ED Course  I have reviewed the triage vital signs and the nursing notes.  Pertinent labs & imaging results that were available during my care of the patient were reviewed by me and considered in my medical decision making (see chart for details).  Clinical Course    82 yo M With a  chief complaint of palpitations. This lasted for approximately 5 seconds. Patient is currently asymptomatic. EKG is unremarkable. Will obtain a basic laboratory evaluation to rule out electrolyte abnormality. I feel this is unlikely to be ACS due to length of symptoms. Patient is on Pradaxa, and with length of symptoms feel that that PE is unlikely.  Hyponatremia worse than baseline.  Discussed with family have been on a strict sodium restricted diet.  Will give small fluid bolus,  cards follow up.   6:12 AM:  I have discussed the diagnosis/risks/treatment options with the patient and family and believe the pt to be eligible for discharge home to follow-up with PCP. We also discussed returning to the ED immediately if new or worsening sx occur. We discussed the sx which are most concerning (e.g., sudden worsening pain, fever, inability to tolerate by mouth) that necessitate immediate return. Medications administered to the patient during their visit and any new prescriptions provided to the patient are listed below.  Medications given during this visit Medications  sodium chloride 0.9 % bolus 500 mL (not administered)     The patient appears reasonably screen and/or stabilized for discharge and I doubt any other medical condition or other [place] [facility] requiring further screening, evaluation, or treatment in the ED at this time prior to discharge.    Final Clinical Impressions(s) / ED Diagnoses   Final diagnoses:  Palpitations  Hyponatremia    New Prescriptions New Prescriptions   No medications on file     Deno Etienne, DO 12/08/15 [id number]

## 2015-12-08 NOTE — ED Notes (Signed)
Pt understod dc material. NAD noted

## 2015-12-08 NOTE — ED Triage Notes (Signed)
Pt was going back to bed and felt his heart flutter. 1-1.5 hours PTA. Denies chest pain

## 2015-12-09 ENCOUNTER — Telehealth: Payer: Self-pay | Admitting: Cardiology

## 2015-12-09 DIAGNOSIS — R7989 Other specified abnormal findings of blood chemistry: Secondary | ICD-10-CM

## 2015-12-09 NOTE — Telephone Encounter (Signed)
New message       Pt went to the ER early Sunday morning because of palpitations.  He was checked out and released.  He has an appt on 12-18-15 with Dr Percival Spanish.  Should he be seen sooner?

## 2015-12-09 NOTE — Telephone Encounter (Signed)
Returned call to patient.He stated he had episode of sob,just did not feel right yesterday morning.Stated he went to [facility] ER, sodium was low.Advised to call Dr.Hochrein to see if he needed to be seen sooner.Stated he has appointment with Rosaria Ferries PA 12/18/15.Stated he feels great this morning.Advised Dr.Hochrein out of office this week.Stated he would like Dr.Hochrein to know.Advised I will send message to him.

## 2015-12-10 NOTE — Telephone Encounter (Signed)
Tell him to reduce his Lasix to 20 mg daily.  Make sure he is not drinking a lot of water and have him repeat a BMET in one week.  OK to keep appt as scheduled.

## 2015-12-10 NOTE — Telephone Encounter (Signed)
Returned call to patient.Dr.Hochrein's recommendations given.Advised to keep appointment with Rosaria Ferries PA 12/18/15 at 8:30 am bmet to be done after visit.

## 2015-12-10 NOTE — Addendum Note (Signed)
Addended by: Kathyrn Lass on: 12/10/2015 05:20 PM   Modules accepted: Orders

## 2015-12-18 ENCOUNTER — Ambulatory Visit (INDEPENDENT_AMBULATORY_CARE_PROVIDER_SITE_OTHER): Payer: Medicare Other | Admitting: Physician Assistant

## 2015-12-18 ENCOUNTER — Telehealth: Payer: Self-pay | Admitting: Cardiology

## 2015-12-18 ENCOUNTER — Encounter: Payer: Self-pay | Admitting: Physician Assistant

## 2015-12-18 VITALS — BP 115/84 | HR 89 | Ht 70.0 in | Wt 185.6 lb

## 2015-12-18 DIAGNOSIS — E871 Hypo-osmolality and hyponatremia: Secondary | ICD-10-CM | POA: Diagnosis not present

## 2015-12-18 DIAGNOSIS — I480 Paroxysmal atrial fibrillation: Secondary | ICD-10-CM | POA: Diagnosis not present

## 2015-12-18 DIAGNOSIS — R5381 Other malaise: Secondary | ICD-10-CM

## 2015-12-18 DIAGNOSIS — I5043 Acute on chronic combined systolic (congestive) and diastolic (congestive) heart failure: Secondary | ICD-10-CM

## 2015-12-18 LAB — BASIC METABOLIC PANEL
BUN: 29 mg/dL — ABNORMAL HIGH (ref 7–25)
CALCIUM: 9.3 mg/dL (ref 8.6–10.3)
CO2: 25 mmol/L (ref 20–31)
Chloride: 99 mmol/L (ref 98–110)
Creat: 1.07 mg/dL (ref 0.70–1.11)
GLUCOSE: 111 mg/dL — AB (ref 65–99)
POTASSIUM: 4.4 mmol/L (ref 3.5–5.3)
SODIUM: 136 mmol/L (ref 135–146)

## 2015-12-18 MED ORDER — WHEELCHAIR MISC: 1.0000 | Freq: Once | 0 refills | Status: AC

## 2015-12-18 MED ORDER — FUROSEMIDE 40 MG PO TABS: ORAL_TABLET | ORAL | 3 refills | Status: DC

## 2015-12-18 MED ORDER — ROLLATOR MISC: 1.0000 | Freq: Once | 0 refills | Status: AC

## 2015-12-18 NOTE — Telephone Encounter (Signed)
Returned patient call-wanted to clarify instructions given to him today at [facility] regarding constipation:  Take Miralax and Colace--2 capsules twice daily; as needed for constipation.  Pt verbalized understanding and advised to call with further questions/concerns.

## 2015-12-18 NOTE — Progress Notes (Signed)
Cardiology Office Note   Date:  12/18/2015   ID:  Aaron Lucas, DOB 10/03/1928, MRN [id number]  PCP:  Geoffery Lyons, MD  Cardiologist:  Dr Mardene Celeste, PA-C   Chief Complaint  Patient presents with  . Leg Swelling    discuss lasix    History of Present Illness: Aaron Lucas is a 82 y.o. male with a history of D-CHF, HTN, PAF, MV regurg & AS, prostate CA, DNR.   ER visit x 2 for hernia>>surgical repair>>cards consult, echo w/ EF 45-50%, grade 3 dd, had surgery, d/c 08/20 ER visit 08/27 for palpitations.Sodium was found to be 127 and sodium restrictions were eased. Sinus rhythm on ECG and telemetry with PVCs  Aaron Lucas presents for Follow-up from his recent hospital visit and ER visit.  Aaron Lucas is compliant with medications and dietary restrictions. He is using more sodium now then previously. He has noted increasing lower extremity edema. He may also have a minor change in his dyspnea on exertion. His weight has been creeping up, and has gone up about 3 pounds in the last week.  He is generally weak and deconditioned. He has trouble getting in and out of the car. He has trouble walking any significant distances. He is using a walker, but has a great deal of trouble getting in and out of the car. Also, there is no seat so when he gets tired walking, etc. difficult for him. He feels he might benefit from physical therapy. He used to do the [employer] program at [employer], but has not been there in a couple of months.  He has noticed constipation, his stools are smaller in quantity and harder in previously. This started about the time he started taking Lasix. He wonders what he can do about that.  He has had no further episodes of palpitations. He has not been lightheaded or dizzy.   Past Medical History:  Diagnosis Date  . Cataracts, bilateral   . Chronic diastolic CHF (congestive heart failure) ([place]) 11/25/2015  . Detached retina Twice   As  a result he is lost vision in the left eye.  . Diverticulosis 2006  . Hx of colonic polyps 2006,  2011  . Hypertension   . Paroxysmal atrial fibrillation (HCC)    On Pradaxa.   . Prostate cancer ([place]) 2004   seed implant  . 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 Valvular heart disease    MV regurge and Ao stenosis  . Vertigo     Past Surgical History:  Procedure Laterality Date  . CARDIAC CATHETERIZATION N/A 04/19/2015   Procedure: Right/Left Heart Cath and Coronary Angiography;  Surgeon: Burnell Blanks, MD;  Location: [facility];  Service: Cardiovascular;  Laterality: N/A;  . CATARACT EXTRACTION    . COLONOSCOPY  2006 and 11/2009   tubular adenomas on both studies. int rrhoids, diverticulosis.   . COLONOSCOPY N/A 11/15/2014   Procedure: COLONOSCOPY;  Surgeon: Milus Banister, MD;  Location: [facility];  Service: Endoscopy;  Laterality: N/A;  . 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 Focal urothelial atypia in 2006.  Marland Kitchen ESOPHAGOGASTRODUODENOSCOPY N/A 11/15/2014   Procedure: ESOPHAGOGASTRODUODENOSCOPY (EGD);  Surgeon: Milus Banister, MD;  Location: [facility];  Service: Endoscopy;  Laterality: N/A;  . INGUINAL HERNIA REPAIR Bilateral 11/28/2015   Procedure: BILATERAL INGUINAL HERNIA REPAIR;  Surgeon: Marcello Moores  Cornett, MD;  Location: [facility];  Service: General;  Laterality: Bilateral;  . INSERTION OF MESH Bilateral 11/28/2015   Procedure: INSERTION OF MESH;  Surgeon: Erroll Luna, MD;  Location: [facility];  Service: General;  Laterality: Bilateral;  . PLEURAL EFFUSION DRAINAGE  2010  . RADIOACTIVE SEED IMPLANT  2004   For treatment of prostate cancer  . SQUAMOUS CELL CARCINOMA EXCISION  2002, 2003, 2006.  . TEE WITHOUT CARDIOVERSION N/A 05/14/2015   Procedure: TRANSESOPHAGEAL ECHOCARDIOGRAM (TEE);  Surgeon: Larey Dresser, MD;  Location: [facility];  Service: Cardiovascular;   Laterality: N/A;  . THORACENTESIS    . TONSILLECTOMY      Current Outpatient Prescriptions  Medication Sig Dispense Refill  . clindamycin (CLEOCIN T) 1 % lotion Apply 1 application topically 2 (two) times daily.    . dabigatran (PRADAXA) 150 MG CAPS capsule Take 1 capsule (150 mg total) by mouth every 12 (twelve) hours. 180 capsule 3  . furosemide (LASIX) 40 MG tablet Take 40 mg by mouth daily. Then take 40 mg twice daily as needed for fluid retention until weight has returned to 179lbs. 180 tablet 3  . lisinopril (PRINIVIL,ZESTRIL) 2.5 MG tablet Take 1 tablet (2.5 mg total) by mouth daily. 30 tablet 0  . loratadine (CLARITIN) 10 MG tablet Take 10 mg by mouth daily as needed for allergies.     . metroNIDAZOLE (METROCREAM) 0.75 % cream Apply 1 application topically 2 (two) times daily as needed (for skin lesions).     . Misc Natural Products (RED WINE EXTRACT PO) Take 1 tablet by mouth 2 (two) times a week.    . Multiple Vitamins-Minerals (MULTIVITAMIN WITH MINERALS) tablet Take 1 tablet by mouth daily.    . Omega-3 Fatty Acids (FISH OIL PO) Take 1 capsule by mouth daily.     Marland Kitchen omeprazole (PRILOSEC) 40 MG capsule Take 40 mg by mouth daily.  3  . triamcinolone (NASACORT ALLERGY 24HR) 55 MCG/ACT AERO nasal inhaler Place 1 spray into both nostrils daily as needed (for congestion).     . vitamin C (ASCORBIC ACID) 500 MG tablet Take 500 mg by mouth daily.    . Misc. Devices (ROLLATOR) MISC 1 each by Does not apply route once. 1 each 0   No current facility-administered medications for this visit.     Allergies:   Ambien [zolpidem tartrate]; Penicillins; Diamox [acetazolamide]; Other; Ciprofloxacin; Doxycycline; Erythromycin; and Neptazane [methazolamide]    Social History:  The patient  reports that he has never smoked. He has never used smokeless tobacco. He reports that he drinks alcohol. He reports that he does not use drugs.   Family History:  The patient's family history includes Cancer  in his mother; Pulmonary embolism (age of onset: 66) in his father.    ROS:  Please see the history of present illness. All other systems are reviewed and negative.    PHYSICAL EXAM: VS:  BP 115/84   Pulse 89   Ht 5\' 10"  (1.778 m)   Wt 185 lb 9.6 oz (84.2 kg)   SpO2 98%   BMI 26.63 kg/m  , BMI Body mass index is 26.63 kg/m. GEN: Well nourished, well developed, male in no acute distress  HEENT: normal for age  Neck: JVD 9 cm, positive hepatojugular reflux, no carotid bruit, no masses Cardiac: RRR; 2/6 murmur, no rubs, or gallops Respiratory: decreased BS bases bilaterally, normal work of breathing GI: soft, nontender, nondistended, + BS MS: no deformity or atrophy; 2+ edema (with; distal  pulses are 2+ in all 4 extremities   Skin: warm and dry, no rash Neuro:  Strength and sensation are intact Psych: euthymic mood, full affect   EKG:  EKG is not ordered today.  Recent Labs: 11/25/2015: B Natriuretic Peptide 1,831.5 12/01/2015: ALT 19 12/08/2015: BUN 11; Creatinine, Ser 0.92; Hemoglobin 11.3; Magnesium 1.8; Platelets 220; Potassium 4.6; Sodium 126    Lipid Panel No results found for: CHOL, TRIG, HDL, CHOLHDL, VLDL, LDLCALC, LDLDIRECT   Wt Readings from Last 3 Encounters:  12/18/15 185 lb 9.6 oz (84.2 kg)  11/30/15 183 lb 11.2 oz (83.3 kg)  11/22/15 180 lb (81.6 kg)     Other studies Reviewed: Additional studies/ records that were reviewed today include: .  ASSESSMENT AND PLAN:  1.  Acute on chronic combined systolic and diastolic CHF: His weight is up about 6 pounds per our scales. He is noticing some increasing dyspnea on exertion and lower extremity edema. His sodium intake was recently increased, and this may be contributing to the extra volume. We will decrease the sodium down to 3000 mg daily and use fluid restrictions of 1-1.5 L daily. He will increase the Lasix from 20 mg daily up to 40 mg daily to get his weight down. He can also take an extra 40 mg tablet as needed  if 40 mg a day is not adequate.  2. Hyponatremia: The sodium has been low ever since it was first checked in 2017, but is generally in the 130s. It was 126 when it was checked on 08/27. We will recheck it again today and will have to track this carefully.  3. Constipation: We will start with high-dose Colace and he can also use Miralax when necessary. Adjust these as needed to relieve constipation.  4. Palpitations: He has had no other episodes. Follow  5. Deconditioning: He is using a walker, but get short of breath with minimal ambulation. It is difficult for him to leave the house and he is exhausted afterwards. We will have physical therapy evaluate him to see if PT is needed. Additionally we have provided prescriptions for Rollator walker and a collapsible wheelchair. His wife will assess these with him and she was the one that will provide them with the best benefit.   Current medicines are reviewed at length with the patient today.  The patient does not have concerns regarding medicines.  The following changes have been made:  Increase Lasix  Labs/ tests ordered today include:   Orders Placed This Encounter  Procedures  . Basic Metabolic Panel (BMET)  . Ambulatory referral to Physical Therapy     Disposition:   FU with Dr Percival Spanish  Signed, Rosaria Ferries, PA-C  12/18/2015 9:25 AM    [facility] Phone: [phone]; Fax: [phone]  This note was written with the assistance of speech recognition software. Please excuse any transcriptional errors.

## 2015-12-18 NOTE — Patient Instructions (Addendum)
Medications:  Increase Furosemide to 40 mg daily. Then take 40 mg twice daily as needed for fluid retention until weight has returned to 179lbs. Then return to 20 mg daily.  Take Miralax and Colace--2 capsules twice daily; as needed for constipation.   Labwork:  Your physician recommends that you return for lab work today: BMET   Referral:  You have been referred to Physical Therapy for deconditioning.   Other Instructions:  Your physician has prescribed a Rollator Walker and a transport wheelchair.  Your maximum sodium level is 3000 mg daily.  Take in 1-1.5L of liquid daily (most of this should be water).  Your physician recommends that you continue to weigh, daily, at the same time every day, and in the same amount of clothing. Please record your daily weights on the handout provided and bring it to your next appointment.   Follow-Up:  Your physician recommends that you schedule a follow-up appointment in: 2-3 weeks with Rosaria Ferries, PA-C.

## 2015-12-18 NOTE — Telephone Encounter (Signed)
Mr. Aaron Lucas is calling about the instructions that she gave him for constipation . Please call   Thanks

## 2015-12-19 ENCOUNTER — Telehealth: Payer: Self-pay | Admitting: Cardiology

## 2015-12-19 ENCOUNTER — Ambulatory Visit: Payer: Medicare Other | Admitting: Cardiology

## 2015-12-19 NOTE — Telephone Encounter (Signed)
New message     Pt has questions about his visit on yesterday. No info given. Please call.

## 2015-12-19 NOTE — Telephone Encounter (Signed)
Returned call to patient-pt reports he was seen yesterday and his Lasix was increased for LE edema and increased weight.  Reports this AM he noticed a small amount of weeping to RLE and weight increased to 182.8 overnight (1 lb increase), denies SOB.  Pt took AM dose of 40mg  of Lasix, reports he plans to take another dose this evening per instructions yesterday.  Advised to continue with plan as instructed yesterday at this time:  Increase Furosemide to 40 mg daily. Then take 40 mg twice daily as needed for fluid retention until weight has returned to 179lbs. Then return to 20 mg daily.  Advised to continue to monitor weights closely, salt intake, fluid intake, and monitor leg swelling/weeping.  Advised to call if weight continues to increase or >3lbs increase in 24 hours, and/or patient becomes symptomatic.    Will route to PA for further recommendations.  Pt verbalized understanding.

## 2015-12-20 ENCOUNTER — Telehealth: Payer: Self-pay | Admitting: Cardiology

## 2015-12-20 ENCOUNTER — Ambulatory Visit: Payer: Medicare Other | Admitting: Cardiology

## 2015-12-20 NOTE — Telephone Encounter (Signed)
New Message  Corene Cornea from [facility] call to inform PA Rosaria Ferries of a fax he would be sending about pts transport chair that was requested. please call back for discussion if needed

## 2015-12-20 NOTE — Telephone Encounter (Addendum)
Returned call to [place] at [place] over paperwork that needs to be signed for wheelchair that was ordered at [facility] on 9/6.  Faxing to [phone].    Fax received-PA signed, returned fax to number provided, attention: Corene Cornea.

## 2015-12-23 ENCOUNTER — Telehealth: Payer: Self-pay | Admitting: Physician Assistant

## 2015-12-23 NOTE — Telephone Encounter (Signed)
Please call,pt is confused about how to take his Furosemide.

## 2015-12-23 NOTE — Telephone Encounter (Signed)
Returned call to patient-pt reports he is confused about how much Lasix he is suppose to be taking.  Reports he was instructed to take 40mg  BID until weight was 179lbs but was called today and told differently (per lab work). Reports weight this AM was 182 lbs by home scale.  Reports minor SOB that resolves with rest and decreased LE swelling as he is wearing his compression stockings daily.   Spoke with Rosaria Ferries PA regarding medication dosage-per PA begin taking 40mg  Lasik daily today and will repeat blood work at next [place] (9/27).  Pt and wife aware and verbalized understanding.  Advised to call with further questions/concerns if needed.

## 2015-12-24 NOTE — Telephone Encounter (Signed)
If you can move up his appointment, that would be good.  Thanks

## 2015-12-26 NOTE — Telephone Encounter (Signed)
See telephone note from 9/11.  Spoke with patient and Rosaria Ferries PA on 9/11-medications adjusted based on lab work and pt s/sx improving.  Pt and wife verbalized understanding and advised to call if weight increased >3lbs in 24 hours and/or pt becomes symptomatic (increased swelling, SOB).    3 week f/u with Rosaria Ferries on 9/27 @ 9:30 at [facility].

## 2015-12-28 ENCOUNTER — Encounter (HOSPITAL_BASED_OUTPATIENT_CLINIC_OR_DEPARTMENT_OTHER): Payer: Self-pay | Admitting: *Deleted

## 2015-12-28 ENCOUNTER — Emergency Department (HOSPITAL_BASED_OUTPATIENT_CLINIC_OR_DEPARTMENT_OTHER)
Admission: EM | Admit: 2015-12-28 | Discharge: 2015-12-28 | Disposition: A | Discharge status: Home / Self Care | Payer: Medicare Other | Attending: Emergency Medicine | Admitting: Emergency Medicine

## 2015-12-28 DIAGNOSIS — I5032 Chronic diastolic (congestive) heart failure: Secondary | ICD-10-CM | POA: Diagnosis not present

## 2015-12-28 DIAGNOSIS — Z8546 Personal history of malignant neoplasm of prostate: Secondary | ICD-10-CM | POA: Diagnosis not present

## 2015-12-28 DIAGNOSIS — L03115 Cellulitis of right lower limb: Secondary | ICD-10-CM

## 2015-12-28 DIAGNOSIS — Z8551 Personal history of malignant neoplasm of bladder: Secondary | ICD-10-CM | POA: Insufficient documentation

## 2015-12-28 DIAGNOSIS — G4700 Insomnia, unspecified: Secondary | ICD-10-CM | POA: Diagnosis not present

## 2015-12-28 DIAGNOSIS — R600 Localized edema: Secondary | ICD-10-CM | POA: Diagnosis present

## 2015-12-28 DIAGNOSIS — R609 Edema, unspecified: Secondary | ICD-10-CM

## 2015-12-28 DIAGNOSIS — I110 Hypertensive heart disease with heart failure: Secondary | ICD-10-CM | POA: Diagnosis not present

## 2015-12-28 NOTE — ED Notes (Signed)
Pt states that he has been unable to sleep for three nights, unable to get comfortable. Has taken melatonin, denies any further meds to help him sleep.

## 2015-12-28 NOTE — ED Provider Notes (Signed)
[facility] Provider Note: Georgena Spurling, MD, FACEP  CSN: [id number] MRN: [id number] ARRIVAL: 12/28/15 at [place]  Aaron Lucas is a 82 y.o. male who underwent a herniorrhaphy about a month ago. He has had difficulty sleeping since which has worsened over the past 3 nights. He feels fidgety and restless throughout the night. He gets up frequently. Despite not sleeping he feels normal during the day as if he had gotten a good night's sleep. There is no specific pain, shortness of breath, nausea or other symptom that is preventing him from sleeping. He has taken melatonin without relief. He took Tylenol PM without relief. He is reticent to take any other drugs due to fear of side effects. He has edema of his lower legs and his PCP recently placed him on Bactrim for cellulitis of the right lower leg; the insomnia preceded the start of Bactrim.   Past Medical History:  Diagnosis Date  . Cataracts, bilateral   . Chronic diastolic CHF (congestive heart failure) ([place]) 11/25/2015  . Detached retina Twice   As a result he is lost vision in the left eye.  . Diverticulosis 2006  . Hx of colonic polyps 2006,  2011  . Hypertension   . Paroxysmal atrial fibrillation (HCC)    On Pradaxa.   . Prostate cancer ([place]) 2004   seed implant  . 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 Valvular heart disease    MV regurge and Ao stenosis  . Vertigo     Past Surgical History:  Procedure Laterality Date  . CARDIAC CATHETERIZATION N/A 04/19/2015   Procedure: Right/Left Heart Cath and Coronary Angiography;  Surgeon: Burnell Blanks, MD;  Location: [facility];  Service: Cardiovascular;  Laterality: N/A;  . CATARACT EXTRACTION    . COLONOSCOPY  2006 and 11/2009   tubular adenomas on both studies. int rrhoids, diverticulosis.   . COLONOSCOPY N/A 11/15/2014   Procedure: COLONOSCOPY;  Surgeon: Milus Banister, MD;  Location: [facility];  Service: Endoscopy;  Laterality: N/A;  . 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 Focal urothelial atypia in 2006.  Marland Kitchen ESOPHAGOGASTRODUODENOSCOPY N/A 11/15/2014   Procedure: ESOPHAGOGASTRODUODENOSCOPY (EGD);  Surgeon: Milus Banister, MD;  Location: [facility];  Service: Endoscopy;  Laterality: N/A;  . INGUINAL HERNIA REPAIR Bilateral 11/28/2015   Procedure: BILATERAL INGUINAL HERNIA REPAIR;  Surgeon: Erroll Luna, MD;  Location: [facility];  Service: General;  Laterality: Bilateral;  . INSERTION OF MESH Bilateral 11/28/2015   Procedure: INSERTION OF MESH;  Surgeon: Erroll Luna, MD;  Location: [facility];  Service: General;  Laterality: Bilateral;  . PLEURAL EFFUSION DRAINAGE  2010  . RADIOACTIVE SEED IMPLANT  2004   For treatment of prostate cancer  . SQUAMOUS CELL CARCINOMA EXCISION  2002, 2003, 2006.  . TEE WITHOUT CARDIOVERSION N/A 05/14/2015   Procedure: TRANSESOPHAGEAL ECHOCARDIOGRAM (TEE);  Surgeon: Larey Dresser, MD;  Location: [facility];  Service: Cardiovascular;  Laterality: N/A;  . THORACENTESIS    . TONSILLECTOMY      Family History  Problem Relation Age of Onset  . Pulmonary embolism Father 62    after surgery  . Cancer Mother   . Other       there is no known coronary artery disease     Social History  Substance Use Topics  .  Smoking status: Never Smoker  . Smokeless tobacco: Never Used  . Alcohol use Yes     Comment: occassionally    Prior to Admission medications   Medication Sig Start Date End Date Taking? Authorizing Provider  clindamycin (CLEOCIN T) 1 % lotion Apply 1 application topically 2 (two) times daily.    Historical Provider, MD  dabigatran (PRADAXA) 150 MG CAPS capsule Take 1 capsule (150 mg total) by mouth every 12 (twelve) hours. 11/14/15   Minus Breeding, MD  furosemide (LASIX) 40 MG tablet Take 40 mg by mouth daily. Then take 40 mg twice  daily as needed for fluid retention until weight has returned to 179lbs. 12/18/15   Rhonda G Barrett, PA-C  lisinopril (PRINIVIL,ZESTRIL) 2.5 MG tablet Take 1 tablet (2.5 mg total) by mouth daily. 12/01/15   Orson Eva, MD  loratadine (CLARITIN) 10 MG tablet Take 10 mg by mouth daily as needed for allergies.     Historical Provider, MD  metroNIDAZOLE (METROCREAM) 0.75 % cream Apply 1 application topically 2 (two) times daily as needed (for skin lesions).     Historical Provider, MD  Misc Natural Products (RED WINE EXTRACT PO) Take 1 tablet by mouth 2 (two) times a week.    Historical Provider, MD  Multiple Vitamins-Minerals (MULTIVITAMIN WITH MINERALS) tablet Take 1 tablet by mouth daily.    Historical Provider, MD  Omega-3 Fatty Acids (FISH OIL PO) Take 1 capsule by mouth daily.     Historical Provider, MD  omeprazole (PRILOSEC) 40 MG capsule Take 40 mg by mouth daily. 12/18/14   Historical Provider, MD  triamcinolone (NASACORT ALLERGY 24HR) 55 MCG/ACT AERO nasal inhaler Place 1 spray into both nostrils daily as needed (for congestion).     Historical Provider, MD  vitamin C (ASCORBIC ACID) 500 MG tablet Take 500 mg by mouth daily.    Historical Provider, MD    Allergies Ambien [zolpidem tartrate]; Penicillins; Diamox [acetazolamide]; Other; Ciprofloxacin; Doxycycline; Erythromycin; and Neptazane [methazolamide]   REVIEW OF SYSTEMS  Negative except as noted here or in the History of Present Illness.   PHYSICAL EXAMINATION  Initial Vital Signs Blood pressure 98/66, pulse 99, temperature 98.7 F (37.1 C), temperature source Oral, resp. rate 19, SpO2 95 %.  Examination General: Well-developed, well-nourished male in no acute distress; appearance consistent with age of record HENT: normocephalic; atraumatic Eyes: Irregular pupils bilaterally status post surgery; extraocular muscles intact Neck: supple Heart: regular rate and rhythm; 3/6 systolic murmur at the right upper sternal border Lungs:  clear to auscultation bilaterally Abdomen: soft; nondistended; nontender; no masses or hepatosplenomegaly; bowel sounds present Extremities: No deformity; full range of motion; pulses normal; 2+ pitting edema of lower legs Neurologic: Awake, alert and oriented; motor function intact in all extremities and symmetric; no facial droop Skin: Warm and dry; erythema of distal right lower leg Psychiatric: Normal mood and affect   RESULTS  Summary of this visit's results, reviewed by myself:   EKG Interpretation  Date/Time:    Ventricular Rate:    PR Interval:    QRS Duration:   QT Interval:    QTC Calculation:   R Axis:     Text Interpretation:        Laboratory Studies: No results found for this or any previous visit (from the past 24 hour(s)). Imaging Studies: No results found.  ED COURSE  Nursing notes and initial vitals signs, including pulse oximetry, reviewed.  The patient does not wish any pharmacologic intervention. He is requesting referral to a  sleep specialist. We will refer him to Izora Ribas, MD of neurology.  PROCEDURES    ED DIAGNOSES     ICD-9-CM ICD-10-CM   1. Insomnia 780.52 G47.00   2. Cellulitis of right lower leg 682.6 L03.115   3. Peripheral edema 782.3 R60.9      Shanon Rosser, MD 12/28/15 [id number]

## 2015-12-28 NOTE — ED Notes (Signed)
MD at bedside. 

## 2016-01-08 ENCOUNTER — Ambulatory Visit (INDEPENDENT_AMBULATORY_CARE_PROVIDER_SITE_OTHER): Payer: Medicare Other | Admitting: Physician Assistant

## 2016-01-08 ENCOUNTER — Encounter: Payer: Self-pay | Admitting: Physician Assistant

## 2016-01-08 VITALS — BP 107/74 | HR 94 | Ht 70.0 in | Wt 182.2 lb

## 2016-01-08 DIAGNOSIS — R5381 Other malaise: Secondary | ICD-10-CM

## 2016-01-08 DIAGNOSIS — I480 Paroxysmal atrial fibrillation: Secondary | ICD-10-CM | POA: Diagnosis not present

## 2016-01-08 DIAGNOSIS — I5043 Acute on chronic combined systolic (congestive) and diastolic (congestive) heart failure: Secondary | ICD-10-CM | POA: Diagnosis not present

## 2016-01-08 LAB — BASIC METABOLIC PANEL WITH GFR
BUN: 41 mg/dL — ABNORMAL HIGH (ref 7–25)
CALCIUM: 8.9 mg/dL (ref 8.6–10.3)
CHLORIDE: 105 mmol/L (ref 98–110)
CO2: 25 mmol/L (ref 20–31)
CREATININE: 1.05 mg/dL (ref 0.70–1.11)
GFR, Est African American: 75 mL/min (ref >=60)
GFR, Est Non African American: 65 mL/min (ref >=60)
GLUCOSE: 97 mg/dL (ref 65–99)
Potassium: 4.2 mmol/L (ref 3.5–5.3)
Sodium: 141 mmol/L (ref 135–146)

## 2016-01-08 MED ORDER — FUROSEMIDE 40 MG PO TABS: 40.0000 mg | ORAL_TABLET | Freq: Every day | ORAL | 3 refills | Status: DC

## 2016-01-08 NOTE — Patient Instructions (Signed)
Medications:  Take Lasix 40 mg twice daily on Wednesday, Thursday, and Friday; then switch to 1 tablet daily.  --OK to take 1 extra tablet daily if needed for swelling. Goal weight of 175lbs.   Labwork:  Your physician recommends that you return for lab work today--BMET.   Follow-Up:  Your physician recommends that you schedule a follow-up appointment in: 1 month with Rosaria Ferries, PA-C or Dr. Percival Spanish. If you need a refill on your cardiac medications before your next appointment, please call your pharmacy.

## 2016-01-08 NOTE — Progress Notes (Signed)
Cardiology Office Note   Date:  01/08/2016   ID:  Aaron Lucas, DOB [birth year], MRN [id number]  PCP:  Aaron Lyons, MD  Cardiologist:  Dr Mardene Celeste, PA-C     History of Present Illness: Aaron Lucas is a 82 y.o. male with a history of D-CHF, HTN, PAF, MV regurg & AS, prostate CA, DNR.   Seen in office 09/06 and Lasix increased to 40 mg bid for increased wt/volume, then 40 mg qd, Na 3000 mg and fluid < 1.5 L qd. Constipation a problem. Phone messages led to Lasix 40 mg qd  ER visit 09/16 for insomnia. Primary MD gave rx for LE cellulitis  Aaron Lucas presents for further evaluation and treatment of his CHF. A home health nurse has been coming out and wrapping his right lower extremity. It still has an angry red appearance. He was compliant with antibody therapy for the cellulitis. He has not had fevers or chills. They're watching his leg closely and will keep an eye on it. He still has lower extremity edema, despite a boot on his right leg and compression stocking on the left. He is compliant with a low-sodium diet and is not over drinking. He is compliant with his medications. His weight has been stable. When his weight is at target, she will only give him 20 mg of Lasix. However, his weight has only been at target a couple of times. The rest of the time he has required 40 mg of Lasix. He has not had a high enough weight that he had to get additional Lasix.  He still has significant dyspnea on exertion. A major problem is lack of sleep. He states that he just cannot get any rest. Upon further questioning, it is likely that the problems with sleeping come from orthopnea. He will try sleeping in a chair and get a little bit of rest here and there but not that much. They drive around night sometimes to try and get him more comfortable, but with limited success.   Past Medical History:  Diagnosis Date  . Cataracts, bilateral   . Chronic diastolic CHF  (congestive heart failure) ([place]) 11/25/2015  . Detached retina Twice   As a result he is lost vision in the left eye.  . Diverticulosis 2006  . Hx of colonic polyps 2006,  2011  . Hypertension   . Paroxysmal atrial fibrillation (HCC)    On Pradaxa.   . Prostate cancer ([place]) 2004   seed implant  . 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 Valvular heart disease    MV regurge and Ao stenosis  . Vertigo     Past Surgical History:  Procedure Laterality Date  . CARDIAC CATHETERIZATION N/A 04/19/2015   Procedure: Right/Left Heart Cath and Coronary Angiography;  Surgeon: Burnell Blanks, MD;  Location: [facility];  Service: Cardiovascular;  Laterality: N/A;  . CATARACT EXTRACTION    . COLONOSCOPY  2006 and 11/2009   tubular adenomas on both studies. int rrhoids, diverticulosis.   . COLONOSCOPY N/A 11/15/2014   Procedure: COLONOSCOPY;  Surgeon: Milus Banister, MD;  Location: [facility];  Service: Endoscopy;  Laterality: N/A;  . 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 Focal urothelial atypia in 2006.  Marland Kitchen ESOPHAGOGASTRODUODENOSCOPY N/A 11/15/2014   Procedure: ESOPHAGOGASTRODUODENOSCOPY (EGD);  Surgeon: Milus Banister, MD;  Location: [facility];  Service: Endoscopy;  Laterality: N/A;  . INGUINAL HERNIA REPAIR Bilateral 11/28/2015   Procedure: BILATERAL INGUINAL HERNIA REPAIR;  Surgeon: Erroll Luna, MD;  Location: [facility];  Service: General;  Laterality: Bilateral;  . INSERTION OF MESH Bilateral 11/28/2015   Procedure: INSERTION OF MESH;  Surgeon: Erroll Luna, MD;  Location: [facility];  Service: General;  Laterality: Bilateral;  . PLEURAL EFFUSION DRAINAGE  2010  . RADIOACTIVE SEED IMPLANT  2004   For treatment of prostate cancer  . SQUAMOUS CELL CARCINOMA EXCISION  2002, 2003, 2006.  . TEE WITHOUT CARDIOVERSION N/A 05/14/2015   Procedure: TRANSESOPHAGEAL ECHOCARDIOGRAM (TEE);  Surgeon:  Larey Dresser, MD;  Location: [facility];  Service: Cardiovascular;  Laterality: N/A;  . THORACENTESIS    . TONSILLECTOMY      Current Outpatient Prescriptions  Medication Sig Dispense Refill  . clindamycin (CLEOCIN T) 1 % lotion Apply 1 application topically 2 (two) times daily.    . dabigatran (PRADAXA) 150 MG CAPS capsule Take 1 capsule (150 mg total) by mouth every 12 (twelve) hours. 180 capsule 3  . furosemide (LASIX) 40 MG tablet Take 40 mg by mouth daily. Then take 40 mg twice daily as needed for fluid retention until weight has returned to 179lbs. 180 tablet 3  . lisinopril (PRINIVIL,ZESTRIL) 2.5 MG tablet Take 1 tablet (2.5 mg total) by mouth daily. 30 tablet 0  . loratadine (CLARITIN) 10 MG tablet Take 10 mg by mouth daily as needed for allergies.     . metroNIDAZOLE (METROCREAM) 0.75 % cream Apply 1 application topically 2 (two) times daily as needed (for skin lesions).     . Misc Natural Products (RED WINE EXTRACT PO) Take 1 tablet by mouth 2 (two) times a week.    . Multiple Vitamins-Minerals (MULTIVITAMIN WITH MINERALS) tablet Take 1 tablet by mouth daily.    . Omega-3 Fatty Acids (FISH OIL PO) Take 1 capsule by mouth daily.     Marland Kitchen omeprazole (PRILOSEC) 40 MG capsule Take 40 mg by mouth daily.  3  . triamcinolone (NASACORT ALLERGY 24HR) 55 MCG/ACT AERO nasal inhaler Place 1 spray into both nostrils daily as needed (for congestion).     . vitamin C (ASCORBIC ACID) 500 MG tablet Take 500 mg by mouth daily.     No current facility-administered medications for this visit.     Allergies:   Ambien [zolpidem tartrate]; Penicillins; Diamox [acetazolamide]; Other; Ciprofloxacin; Doxycycline; Erythromycin; and Neptazane [methazolamide]    Social History:  The patient  reports that he has never smoked. He has never used smokeless tobacco. He reports that he drinks alcohol. He reports that he does not use drugs.   Family History:  The patient's family history includes Cancer in his  mother; Pulmonary embolism (age of onset: 77) in his father.    ROS:  Please see the history of present illness. All other systems are reviewed and negative.    PHYSICAL EXAM: VS:  BP 107/74   Pulse 94   Ht 5\' 10"  (1.778 m)   Wt 182 lb 3.2 oz (82.6 kg)   BMI 26.14 kg/m  , BMI Body mass index is 26.14 kg/m. GEN: Well nourished, well developed, male in no acute distress  HEENT: normal for age  Neck:  JVD 8-9 cm, positive hepatojugular reflux, no carotid bruit, no masses Cardiac: RRR; 2/6 murmur, no rubs, or gallops Respiratory:  Decreased breath sounds bases but clear bilaterally, normal work of breathing GI: soft, nontender, nondistended, + BS MS: no deformity  or atrophy; 2+ edema; distal pulses are present in all 4 extremities, decreased in both lower extremities due to edema, compression boot present on the right and not disturbed but serous drainage is noted   Skin: warm and dry, no rash Neuro:  Strength and sensation are intact Psych: euthymic mood, full affect   EKG:  EKG is not ordered today   Recent Labs: 11/25/2015: B Natriuretic Peptide 1,831.5 12/01/2015: ALT 19 12/08/2015: Hemoglobin 11.3; Magnesium 1.8; Platelets 220 12/18/2015: BUN 29; Creat 1.07; Potassium 4.4; Sodium 136    Lipid Panel No results found for: CHOL, TRIG, HDL, CHOLHDL, VLDL, LDLCALC, LDLDIRECT   Wt Readings from Last 3 Encounters:  01/08/16 182 lb 3.2 oz (82.6 kg)  12/18/15 185 lb 9.6 oz (84.2 kg)  11/30/15 183 lb 11.2 oz (83.3 kg)     Other studies Reviewed: Additional studies/ records that were reviewed today include: Previous office notes and testing.  ASSESSMENT AND PLAN:  1.  Acute on chronic combined systolic and diastolic CHF: Upon review of his home weight chart, his weight is at the low end of his norm today. However, he still has significant volume overload. I discussed the situation with the patient and his wife. They agree that he is still volume overloaded. We will decrease his dry  weight down to 175 lbs on his home scale.  Plan: Increase Lasix to 40 mg twice a day until Saturday, 09/30. Thing go to 40 mg daily. Goal dry weight is 175 pounds. BMET today. Take an extra Lasix tablet as needed for swelling. Continue watching the sodium and fluid very carefully. Follow-up in one month or sooner when necessary. His wife is vigilant in his care and we'll do everything possible to make sure that they are able to manage his heart failure. It is hard on him to make doctor's appointments, so anything we can do by phone or by the home health nurse is appreciated.   2. Insomnia: He does not describe it this way, but I think he is describing orthopnea and possibly PND. I have encouraged them to wedge him as high as they can to try and get him some sleep and have written a prescription for a hospital bed which I sincerely hope will help. I advised that everyone is reluctant to prescribe sedation medication on a patient who is at risk of falling. We will try other means to try and get him to sleep. The hospital bed does not help, melatonin can be considered.   Current medicines are reviewed at length with the patient today.  The patient does not have concerns regarding medicines.  The following changes have been made:  Adjust Lasix as above  Labs/ tests ordered today include:   Orders Placed This Encounter  Procedures  . For home use only DME Hospital bed  . BASIC METABOLIC PANEL WITH GFR     Disposition:   FU with Dr. Percival Spanish  Signed, Rosaria Ferries, PA-C  01/08/2016 1:36 PM    [facility] Phone: [phone]; Fax: [phone]  This note was written with the assistance of speech recognition software. Please excuse any transcriptional errors.

## 2016-01-15 ENCOUNTER — Telehealth: Payer: Self-pay | Admitting: *Deleted

## 2016-01-15 DIAGNOSIS — Z79899 Other long term (current) drug therapy: Secondary | ICD-10-CM

## 2016-01-15 NOTE — Telephone Encounter (Signed)
Pt aware of his blood work, BMP for pt to get done

## 2016-01-15 NOTE — Telephone Encounter (Signed)
-----   Message from Lonn Georgia, PA-C sent at 01/14/2016  4:12 PM EDT ----- Pt of Dr Percival Spanish Please let him know his BMET was fine. He needs another by Thursday if possible, the [facility] can draw. Thanks

## 2016-01-17 ENCOUNTER — Emergency Department (HOSPITAL_COMMUNITY): Payer: Medicare Other

## 2016-01-17 ENCOUNTER — Encounter (HOSPITAL_COMMUNITY): Payer: Self-pay | Admitting: Vascular Surgery

## 2016-01-17 ENCOUNTER — Emergency Department (HOSPITAL_COMMUNITY)
Admission: EM | Admit: 2016-01-17 | Discharge: 2016-01-18 | Disposition: A | Discharge status: Home / Self Care | Payer: Medicare Other | Attending: Emergency Medicine | Admitting: Emergency Medicine

## 2016-01-17 DIAGNOSIS — R0600 Dyspnea, unspecified: Secondary | ICD-10-CM

## 2016-01-17 DIAGNOSIS — Z8546 Personal history of malignant neoplasm of prostate: Secondary | ICD-10-CM | POA: Diagnosis not present

## 2016-01-17 DIAGNOSIS — I110 Hypertensive heart disease with heart failure: Secondary | ICD-10-CM | POA: Diagnosis not present

## 2016-01-17 DIAGNOSIS — R0602 Shortness of breath: Secondary | ICD-10-CM | POA: Diagnosis present

## 2016-01-17 DIAGNOSIS — I5042 Chronic combined systolic (congestive) and diastolic (congestive) heart failure: Secondary | ICD-10-CM | POA: Insufficient documentation

## 2016-01-17 LAB — BASIC METABOLIC PANEL
Anion gap: 7 (ref 5–15)
BUN: 28 mg/dL — AB (ref 6–20)
CHLORIDE: 97 mmol/L — AB (ref 101–111)
CO2: 26 mmol/L (ref 22–32)
CREATININE: 1.07 mg/dL (ref 0.61–1.24)
Calcium: 8.6 mg/dL — ABNORMAL LOW (ref 8.9–10.3)
GFR calc Af Amer: >60 mL/min (ref >60)
GFR calc non Af Amer: >60 mL/min (ref >60)
Glucose, Bld: 131 mg/dL — ABNORMAL HIGH (ref 65–99)
Potassium: 4.1 mmol/L (ref 3.5–5.1)
Sodium: 130 mmol/L — ABNORMAL LOW (ref 135–145)

## 2016-01-17 LAB — CBC
HCT: 40.4 % (ref 39.0–52.0)
Hemoglobin: 12.2 g/dL — ABNORMAL LOW (ref 13.0–17.0)
MCH: 26.2 pg (ref 26.0–34.0)
MCHC: 30.2 g/dL (ref 30.0–36.0)
MCV: 86.7 fL (ref 78.0–100.0)
PLATELETS: 161 10*3/uL (ref 150–400)
RBC: 4.66 MIL/uL (ref 4.22–5.81)
RDW: 19.3 % — AB (ref 11.5–15.5)
WBC: 7.9 10*3/uL (ref 4.0–10.5)

## 2016-01-17 LAB — I-STAT TROPONIN, ED: Troponin i, poc: 0.02 ng/mL (ref 0.00–0.08)

## 2016-01-17 NOTE — ED Triage Notes (Signed)
Pt reports to the ED for eval of increased SOB. Pt reports he has been having problems with this for several months now but today the symptoms became much worse. Pt also reports his heart felt like it was racing and he was SOB while getting ready for bed. Pt is supposed to have a sleep study on Tuesday. Denies any cough or CP.

## 2016-01-18 NOTE — ED Notes (Signed)
Patient left at this time with all belongings. 

## 2016-01-18 NOTE — ED Provider Notes (Signed)
[facility] DEPT Provider Note   CSN: [id number] Arrival date & time: 01/17/16  2150  By signing my name below, I, Maud Deed. Royston Sinner, attest that this documentation has been prepared under the direction and in the presence of Lacretia Leigh, MD.  Electronically Signed: Maud Deed. Royston Sinner, ED Scribe. 01/18/16. 12:38 AM.    History   Chief Complaint Chief Complaint  Patient presents with  . Shortness of Breath   The history is provided by the patient. No language interpreter was used.     HPI Comments: Aaron Lucas is a 82 y.o. male with a PMHx of A-Fib who presents to the Emergency Department complaining of intermittent, recurrent, worsening shortness of breath onset this evening while attempting to go to bed. Most recent episode lasting 5 minutes in duration. Pt admits this issues has been ongoing for several months intermittently, however, he states tonight's episodes was more prolonged. Pt also reports feeling as if his heart was racing which spontaneously improved. No breathing treatments attempted prior to arrival. However, pt states he took some OTC Tylenol this evening prior to going to bed. Denies any fever, chills, nausea, vomiting, cough, or chest pain. Pt is due to see his doctor in 4 days to discuss possible sleep study.  PCP: Geoffery Lyons, MD    Past Medical History:  Diagnosis Date  . Cataracts, bilateral   . Chronic diastolic CHF (congestive heart failure) ([place]) 11/25/2015  . Detached retina Twice   As a result he is lost vision in the left eye.  . Diverticulosis 2006  . Hx of colonic polyps 2006,  2011  . Hypertension   . Paroxysmal atrial fibrillation (HCC)    On Pradaxa.   . Prostate cancer ([place]) 2004   seed implant  . 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 Valvular heart disease    MV regurge and Ao stenosis  . Vertigo     Patient Active Problem List   Diagnosis Date Noted  . CHF (congestive heart failure) ([place]) 11/28/2015  .  Acute on chronic combined systolic and diastolic CHF, NYHA class 2 ([place]) 11/27/2015  . Preoperative clearance 11/25/2015  . Acute on chronic diastolic heart failure ([place]) 11/25/2015  . Hernia of abdominal cavity   . Left inguinal hernia 11/24/2015  . Hyponatremia 11/24/2015  . Abnormal urinalysis 11/24/2015  . Severe aortic valve stenosis   . Aortic stenosis 03/15/2015  . Lower GI bleeding 11/13/2014  . Melena 11/13/2014  . Hypertension   . Paroxysmal atrial fibrillation (HCC)   . Mitral regurgitation     Past Surgical History:  Procedure Laterality Date  . CARDIAC CATHETERIZATION N/A 04/19/2015   Procedure: Right/Left Heart Cath and Coronary Angiography;  Surgeon: Burnell Blanks, MD;  Location: [facility];  Service: Cardiovascular;  Laterality: N/A;  . CATARACT EXTRACTION    . COLONOSCOPY  2006 and 11/2009   tubular adenomas on both studies. int rrhoids, diverticulosis.   . COLONOSCOPY N/A 11/15/2014   Procedure: COLONOSCOPY;  Surgeon: Milus Banister, MD;  Location: [facility];  Service: Endoscopy;  Laterality: N/A;  . 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 Focal urothelial atypia in 2006.  Marland Kitchen ESOPHAGOGASTRODUODENOSCOPY N/A 11/15/2014   Procedure: ESOPHAGOGASTRODUODENOSCOPY (EGD);  Surgeon: Milus Banister, MD;  Location: [facility];  Service: Endoscopy;  Laterality: N/A;  . INGUINAL HERNIA REPAIR Bilateral 11/28/2015   Procedure: BILATERAL INGUINAL HERNIA REPAIR;  Surgeon: Erroll Luna, MD;  Location: [facility];  Service: General;  Laterality: Bilateral;  . INSERTION OF MESH Bilateral 11/28/2015   Procedure: INSERTION OF MESH;  Surgeon: Erroll Luna, MD;  Location: [facility];  Service: General;  Laterality: Bilateral;  . PLEURAL EFFUSION DRAINAGE  2010  . RADIOACTIVE SEED IMPLANT  2004   For treatment of prostate cancer  . SQUAMOUS CELL CARCINOMA EXCISION  2002, 2003, 2006.  . TEE WITHOUT  CARDIOVERSION N/A 05/14/2015   Procedure: TRANSESOPHAGEAL ECHOCARDIOGRAM (TEE);  Surgeon: Larey Dresser, MD;  Location: [facility];  Service: Cardiovascular;  Laterality: N/A;  . THORACENTESIS    . TONSILLECTOMY         Home Medications    Prior to Admission medications   Medication Sig Start Date End Date Taking? Authorizing Provider  clindamycin (CLEOCIN T) 1 % lotion Apply 1 application topically 2 (two) times daily.    Historical Provider, MD  dabigatran (PRADAXA) 150 MG CAPS capsule Take 1 capsule (150 mg total) by mouth every 12 (twelve) hours. 11/14/15   Minus Breeding, MD  furosemide (LASIX) 40 MG tablet Take 1 tablet (40 mg total) by mouth daily. 01/08/16   Rhonda G Barrett, PA-C  lisinopril (PRINIVIL,ZESTRIL) 2.5 MG tablet Take 1 tablet (2.5 mg total) by mouth daily. 12/01/15   Orson Eva, MD  loratadine (CLARITIN) 10 MG tablet Take 10 mg by mouth daily as needed for allergies.     Historical Provider, MD  metroNIDAZOLE (METROCREAM) 0.75 % cream Apply 1 application topically 2 (two) times daily as needed (for skin lesions).     Historical Provider, MD  Misc Natural Products (RED WINE EXTRACT PO) Take 1 tablet by mouth 2 (two) times a week.    Historical Provider, MD  Multiple Vitamins-Minerals (MULTIVITAMIN WITH MINERALS) tablet Take 1 tablet by mouth daily.    Historical Provider, MD  Omega-3 Fatty Acids (FISH OIL PO) Take 1 capsule by mouth daily.     Historical Provider, MD  omeprazole (PRILOSEC) 40 MG capsule Take 40 mg by mouth daily. 12/18/14   Historical Provider, MD  triamcinolone (NASACORT ALLERGY 24HR) 55 MCG/ACT AERO nasal inhaler Place 1 spray into both nostrils daily as needed (for congestion).     Historical Provider, MD  vitamin C (ASCORBIC ACID) 500 MG tablet Take 500 mg by mouth daily.    Historical Provider, MD    Family History Family History  Problem Relation Age of Onset  . Pulmonary embolism Father 96    after surgery  . Cancer Mother   . Other        there is no known coronary artery disease     Social History Social History  Substance Use Topics  . Smoking status: Never Smoker  . Smokeless tobacco: Never Used  . Alcohol use Yes     Comment: occassionally     Allergies   Ambien [zolpidem tartrate]; Penicillins; Diamox [acetazolamide]; Other; Ciprofloxacin; Doxycycline; Erythromycin; and Neptazane [methazolamide]   Review of Systems Review of Systems  Constitutional: Negative for chills and fever.  Respiratory: Positive for shortness of breath. Negative for cough.   Cardiovascular: Negative for chest pain.  Gastrointestinal: Negative for nausea and vomiting.  All other systems reviewed and are negative.    Physical Exam Updated Vital Signs BP (!) 104/53 (BP Location: Left Arm)   Pulse 80   Temp (!) 96.3 F (35.7 C) (Axillary)   Resp 16   SpO2 100%   Physical Exam  Constitutional: He is oriented to  person, place, and time. He appears well-developed and well-nourished. No distress.  HENT:  Head: Normocephalic and atraumatic.  Eyes: Conjunctivae and EOM are normal. Pupils are equal, round, and reactive to light. No scleral icterus.  Neck: Normal range of motion. Neck supple. No thyromegaly present.  Cardiovascular: Normal rate, regular rhythm, normal heart sounds and intact distal pulses.  Exam reveals no gallop and no friction rub.   No murmur heard. Pulmonary/Chest: Effort normal and breath sounds normal. No respiratory distress. He has no wheezes. He has no rales.  Abdominal: Soft. Bowel sounds are normal. He exhibits no distension. There is no tenderness. There is no rebound.  Musculoskeletal: Normal range of motion. He exhibits edema.  RLE with 3+ pitting edema that is palpable. Bandage in place.   Neurological: He is alert and oriented to person, place, and time.  Skin: Skin is warm and dry. No rash noted.  Psychiatric: He has a normal mood and affect. His behavior is normal. Judgment normal.  Nursing note and  vitals reviewed.    ED Treatments / Results   DIAGNOSTIC STUDIES: Oxygen Saturation is 100% on RA, Normal by my interpretation.    COORDINATION OF CARE: 12:37 AM- Will order blood work, EKG, and CXR. Discussed treatment plan with pt at bedside and pt agreed to plan.     Labs (all labs ordered are listed, but only abnormal results are displayed) Labs Reviewed  BASIC METABOLIC PANEL - Abnormal; Notable for the following:       Result Value   Sodium 130 (*)    Chloride 97 (*)    Glucose, Bld 131 (*)    BUN 28 (*)    Calcium 8.6 (*)    All other components within normal limits  CBC - Abnormal; Notable for the following:    Hemoglobin 12.2 (*)    RDW 19.3 (*)    All other components within normal limits  I-STAT TROPOININ, ED    EKG  EKG Interpretation  Date/Time:  Friday January 17 2016 21:55:41 EDT Ventricular Rate:  89 PR Interval:  184 QRS Duration: 90 QT Interval:  392 QTC Calculation: 476 R Axis:   35 Text Interpretation:  Normal sinus rhythm Septal infarct , age undetermined Abnormal ECG Confirmed by Zenia Resides  MD, Ryett Hamman ([id number]) on 01/18/2016 12:12:47 AM       Radiology Dg Chest 1 View  Result Date: 01/17/2016 CLINICAL DATA:  Increased shortness of breath for several months, today worsening. EXAM: CHEST 1 VIEW COMPARISON:  12/08/2015 FINDINGS: Shallow inspiration. Cardiac enlargement without vascular congestion. No focal consolidation or edema. No blunting of costophrenic angles. No pneumothorax. Old left rib fractures old fracture deformity left clavicle. Degenerative changes in the spine and shoulders. Calcification of the aorta. IMPRESSION: Cardiac enlargement.  No evidence of active pulmonary disease. Electronically Signed   By: Lucienne Capers M.D.   On: 01/17/2016 22:42    Procedures Procedures (including critical care time)  Medications Ordered in ED Medications - No data to display   Initial Impression / Assessment and Plan / ED Course  I have  reviewed the triage vital signs and the nursing notes.  Pertinent labs & imaging results that were available during my care of the patient were reviewed by me and considered in my medical decision making (see chart for details).  Clinical Course    Patient here with dyspnea which is been ongoing for quite some time and his symptoms have resolved. Patient was back to his baseline. He denies any  symptoms of CHF, angina, pneumonia. Do not think that this represents pulmonary embolism. Pulse oximetry is stable. His respiratory rate is normal. Pulses normal. Precautions given. He is scheduled for a sleep study next week and was encouraged to keep this.  Final Clinical Impressions(s) / ED Diagnoses   Final diagnoses:  SOB (shortness of breath)    New Prescriptions New Prescriptions   No medications on file   I personally performed the services described in this documentation, which was scribed in my presence. The recorded information has been reviewed and is accurate.      Lacretia Leigh, MD 01/18/16 [phone]

## 2016-01-22 ENCOUNTER — Encounter: Payer: Self-pay | Admitting: Neurology

## 2016-01-22 ENCOUNTER — Ambulatory Visit (INDEPENDENT_AMBULATORY_CARE_PROVIDER_SITE_OTHER): Payer: Medicare Other | Admitting: Neurology

## 2016-01-22 VITALS — BP 92/70 | HR 72 | Resp 20 | Ht 70.0 in | Wt 180.0 lb

## 2016-01-22 DIAGNOSIS — R432 Parageusia: Secondary | ICD-10-CM

## 2016-01-22 DIAGNOSIS — I350 Nonrheumatic aortic (valve) stenosis: Secondary | ICD-10-CM

## 2016-01-22 DIAGNOSIS — G4700 Insomnia, unspecified: Secondary | ICD-10-CM | POA: Insufficient documentation

## 2016-01-22 DIAGNOSIS — E871 Hypo-osmolality and hyponatremia: Secondary | ICD-10-CM

## 2016-01-22 DIAGNOSIS — J387 Other diseases of larynx: Secondary | ICD-10-CM

## 2016-01-22 DIAGNOSIS — G4730 Sleep apnea, unspecified: Secondary | ICD-10-CM | POA: Diagnosis not present

## 2016-01-22 DIAGNOSIS — G4737 Central sleep apnea in conditions classified elsewhere: Secondary | ICD-10-CM

## 2016-01-22 LAB — BASIC METABOLIC PANEL
BUN: 25 mg/dL (ref 7–25)
CHLORIDE: 99 mmol/L (ref 98–110)
CO2: 27 mmol/L (ref 20–31)
Calcium: 8.8 mg/dL (ref 8.6–10.3)
Creat: 1.01 mg/dL (ref 0.70–1.11)
Glucose, Bld: 96 mg/dL (ref 65–99)
POTASSIUM: 4.1 mmol/L (ref 3.5–5.3)
SODIUM: 138 mmol/L (ref 135–146)

## 2016-01-22 MED ORDER — ALPRAZOLAM 0.25 MG PO TABS: 0.2500 mg | ORAL_TABLET | Freq: Every evening | ORAL | 0 refills | Status: AC | PRN

## 2016-01-22 NOTE — Patient Instructions (Signed)
Mirtazapine tablets What is this medicine? MIRTAZAPINE (mir TAZ a peen) is used to treat depression. This medicine may be used for other purposes; ask your health care provider or pharmacist if you have questions. What should I tell my health care provider before I take this medicine? They need to know if you have any of these conditions: -bipolar disorder -glaucoma -kidney disease -liver disease -suicidal thoughts -an unusual or allergic reaction to mirtazapine, other medicines, foods, dyes, or preservatives -pregnant or trying to get pregnant -breast-feeding How should I use this medicine? Take this medicine by mouth with a glass of water. Follow the directions on the prescription label. Take your medicine at regular intervals. Do not take your medicine more often than directed. Do not stop taking this medicine suddenly except upon the advice of your doctor. Stopping this medicine too quickly may cause serious side effects or your condition may worsen. A special MedGuide will be given to you by the pharmacist with each prescription and refill. Be sure to read this information carefully each time. Talk to your pediatrician regarding the use of this medicine in children. Special care may be needed. Overdosage: If you think you have taken too much of this medicine contact a poison control center or emergency room at once. NOTE: This medicine is only for you. Do not share this medicine with others. What if I miss a dose? If you miss a dose, take it as soon as you can. If it is almost time for your next dose, take only that dose. Do not take double or extra doses. What may interact with this medicine? Do not take this medicine with any of the following medications: -linezolid -MAOIs like Carbex, Eldepryl, Marplan, Nardil, and Parnate -methylene blue (injected into a vein) This medicine may also interact with the following medications: -alcohol -antiviral medicines for HIV or AIDS -certain  medicines that treat or prevent blood clots like warfarin -certain medicines for depression, anxiety, or psychotic disturbances -certain medicines for fungal infections like ketoconazole and itraconazole -certain medicines for migraine headache like almotriptan, eletriptan, frovatriptan, naratriptan, rizatriptan, sumatriptan, zolmitriptan -certain medicines for seizures like carbamazepine or phenytoin -certain medicines for sleep -cimetidine -erythromycin -fentanyl -lithium -medicines for blood pressure -nefazodone -rasagiline -rifampin -supplements like St. John's wort, kava kava, valerian -tramadol -tryptophan This list may not describe all possible interactions. Give your health care provider a list of all the medicines, herbs, non-prescription drugs, or dietary supplements you use. Also tell them if you smoke, drink alcohol, or use illegal drugs. Some items may interact with your medicine. What should I watch for while using this medicine? Tell your doctor if your symptoms do not get better or if they get worse. Visit your doctor or health care professional for regular checks on your progress. Because it may take several weeks to see the full effects of this medicine, it is important to continue your treatment as prescribed by your doctor. Patients and their families should watch out for new or worsening thoughts of suicide or depression. Also watch out for sudden changes in feelings such as feeling anxious, agitated, panicky, irritable, hostile, aggressive, impulsive, severely restless, overly excited and hyperactive, or not being able to sleep. If this happens, especially at the beginning of treatment or after a change in dose, call your health care professional. You may get drowsy or dizzy. Do not drive, use machinery, or do anything that needs mental alertness until you know how this medicine affects you. Do not stand or sit up   quickly, especially if you are an older patient. This  reduces the risk of dizzy or fainting spells. Alcohol may interfere with the effect of this medicine. Avoid alcoholic drinks. This medicine may cause dry eyes and blurred vision. If you wear contact lenses you may feel some discomfort. Lubricating drops may help. See your eye doctor if the problem does not go away or is severe. Your mouth may get dry. Chewing sugarless gum or sucking hard candy, and drinking plenty of water may help. Contact your doctor if the problem does not go away or is severe. What side effects may I notice from receiving this medicine? Side effects that you should report to your doctor or health care professional as soon as possible: -allergic reactions like skin rash, itching or hives, swelling of the face, lips, or tongue -breathing problems -confusion -fever, sore throat, or mouth ulcers or blisters -flu like symptoms including fever, chills, cough, muscle or joint aches and pains -stomach pain with nausea and/or vomiting -suicidal thoughts or other mood changes -swelling of the hands or feet -unusual bleeding or bruising -unusually weak or tired -vomiting Side effects that usually do not require medical attention (report to your doctor or health care professional if they continue or are bothersome): -constipation -increased appetite -weight gain This list may not describe all possible side effects. Call your doctor for medical advice about side effects. You may report side effects to FDA at 1-800-FDA-1088. Where should I keep my medicine? Keep out of the reach of children. Store at room temperature between 15 and 30 degrees C (59 and 86 degrees F) Protect from light and moisture. Throw away any unused medicine after the expiration date. NOTE: This sheet is a summary. It may not cover all possible information. If you have questions about this medicine, talk to your doctor, pharmacist, or health care provider.    2016, [employer]vier/[employer] Standard. (2012-10-21  13:11:19)    

## 2016-01-22 NOTE — Addendum Note (Signed)
Addended by: Larey Seat on: 01/22/2016 12:38 PM   Modules accepted: Orders

## 2016-01-22 NOTE — Progress Notes (Signed)
[facility]   Provider:  Larey Seat, M D  Referring Provider: Burnard Bunting, MD Primary Care Physician:  Geoffery Lyons, MD  Chief Complaint  Patient presents with  . New Patient (Initial Visit)    went to ER for insomnia    HPI:  Aaron Lucas is a 82 y.o. male , seen here as a referral from Dr. Florina Ou, for evaluation of acute onset insomnia.   I had the pleasure of seeing Aaron Lucas today who just turned 82. 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 He was diagnosed with a carcinoma of the bladder, valvular heart disease with aortic stenosis and mitral valve regurgitation, and vertigo. The inguinal hernia repair was performed per Dr. Erroll Luna on 11/28/2015. He had a transesophageal cardiogram without cardioversion in January of this year by Dr. Alcus Dad, he is a history of thoracentesis pleural effusion drainage in 2010. I reviewed his medication list today I do not see a medication that would cause him to be agitated or insomniac.  He was referred by Dr. multiple is because he has not been able to sleep well for the last month, this culminated in 3 sleepiness nights prior to his visit with Dr. multiple is on September 16. Just the last week he may have had a slight improvement in his insomnia. He feels fidgety and restless during the day,  he often breathes very heavily and fast - even with minimal exertion, he remains hoarse-  but he catches his breath again when he takes a pause in his activity. There is no specific pain. He has tried Tylenol PM without relief and is reluctant to take prescription drugs. The edema of his left sorry right lower extremity has been treated he  was on Bactrim for cellulitis and 2 open ulcers have started to heal and improve. His wife showed me pictures she has recently taken it appears that the wound is almost dry now. The insomnia also had preceded the start of  Bactrim. He has used a hospital bed to help breathing at night, and preferred to sleep in a recliner. He remains restless and sleepless. His wife is concerned about him falling and injuring himself.   Chief complaint according to patient : Insomnia- I cannot get rest  Sleep habits are as follows: Aaron Lucas sleep habits have completely shifted since his surgery. After his surgery he became extremely confused very agitated and so he has not been quite himself. He has been at home very fidgety unable to sleep a hospital bed was provided to allow him sleep in a reclined position or seated position but he has still trouble to find any rest. Also the day he may dose off for seconds at a time and then wake up again. His wife capture this on video. He reports he sleeps best in the car when driven by his wife in a car he has a seatbelt on and there is no fear of falling. At it may also be the added motor sound. He sleeps on and off during the days, for minutes only.  No longer having a normal circadian rhythm. Not snoring. Wife reported apneas without snoring- likely central apneas.    Sleep medical history and family sleep history:  Non contributory -  only child. 1 healthy son, 1 adopted granddaughter.   Social history: [employer] - married, 39, right handed, retired Teacher, early years/pre. No ETOH, No tobacco user. Used to love travelling.  Review of Systems: Out of a complete 14 system review, the patient complains of only the following symptoms, and all other reviewed systems are negative.  Epworth score 8 , Fatigue severity score 46  , depression score 1/15    Social History   Social History  . Marital status: Married    Spouse name: N/A  . Number of children: 1  . Years of  education: N/A   Occupational History  . retired-Magistrate Retired   Social History Main Topics  . Smoking status: Never Smoker  . Smokeless tobacco: Never Used  . Alcohol use Yes     Comment: occassionally  . Drug use: No  . Sexual activity: Not on file   Other Topics Concern  . Not on file   Social History Narrative   [employer]   Exercises 5-6 days a week    Blood type O Positive   Lowe sodium diet    Family History  Problem Relation Age of Onset  . Pulmonary embolism Father 69    after surgery  . Cancer Mother   . Other       there is no known coronary artery disease     Past Medical History:  Diagnosis Date  . Cataracts, bilateral   . Chronic diastolic CHF (congestive heart failure) ([facility]) 11/25/2015  . Detached retina Twice   As a result he is lost vision in the left eye.  . Diverticulosis 2006  . Hx of colonic polyps 2006,  2011  . Hypertension   . Paroxysmal atrial fibrillation ([facility])    On Pradaxa.   . Prostate cancer ([place]) 2004   seed implant  . 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 Valvular heart disease    MV regurge and Ao stenosis  . Vertigo     Past Surgical History:  Procedure Laterality Date  . CARDIAC CATHETERIZATION N/A 04/19/2015   Procedure: Right/Left Heart Cath and Coronary Angiography;  Surgeon: Burnell Blanks, MD;  Location: [facility];  Service: Cardiovascular;  Laterality: N/A;  . CATARACT EXTRACTION    . COLONOSCOPY  2006 and 11/2009   tubular adenomas on both studies. int rrhoids, diverticulosis.   . COLONOSCOPY N/A 11/15/2014   Procedure: COLONOSCOPY;  Surgeon: Milus Banister, MD;  Location: [facility];  Service: Endoscopy;  Laterality: N/A;  . 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 Focal urothelial atypia in 2006.  Marland Kitchen ESOPHAGOGASTRODUODENOSCOPY N/A 11/15/2014   Procedure: ESOPHAGOGASTRODUODENOSCOPY (EGD);  Surgeon:  Milus Banister, MD;  Location: [facility];  Service: Endoscopy;  Laterality: N/A;  . INGUINAL HERNIA REPAIR Bilateral 11/28/2015   Procedure: BILATERAL INGUINAL HERNIA REPAIR;  Surgeon: Erroll Luna, MD;  Location: [facility];  Service: General;  Laterality: Bilateral;  . INSERTION OF MESH Bilateral 11/28/2015   Procedure: INSERTION OF MESH;  Surgeon: Erroll Luna, MD;  Location: [facility];  Service: General;  Laterality: Bilateral;  . PLEURAL EFFUSION DRAINAGE  2010  . RADIOACTIVE SEED IMPLANT  2004   For treatment of prostate cancer  . SQUAMOUS CELL CARCINOMA EXCISION  2002, 2003, 2006.  . TEE WITHOUT CARDIOVERSION N/A 05/14/2015   Procedure: TRANSESOPHAGEAL ECHOCARDIOGRAM (TEE);  Surgeon: Larey Dresser, MD;  Location: [facility];  Service:  Cardiovascular;  Laterality: N/A;  . THORACENTESIS    . TONSILLECTOMY      Current Outpatient Prescriptions  Medication Sig Dispense Refill  . clindamycin (CLEOCIN T) 1 % lotion Apply 1 application topically 2 (two) times daily.    . dabigatran (PRADAXA) 150 MG CAPS capsule Take 1 capsule (150 mg total) by mouth every 12 (twelve) hours. 180 capsule 3  . furosemide (LASIX) 40 MG tablet Take 1 tablet (40 mg total) by mouth daily. 60 tablet 3  . lisinopril (PRINIVIL,ZESTRIL) 2.5 MG tablet Take 1 tablet (2.5 mg total) by mouth daily. 30 tablet 0  . loratadine (CLARITIN) 10 MG tablet Take 10 mg by mouth daily as needed for allergies.     . metroNIDAZOLE (METROCREAM) 0.75 % cream Apply 1 application topically 2 (two) times daily as needed (for skin lesions).     . Misc Natural Products (RED WINE EXTRACT PO) Take 1 tablet by mouth 2 (two) times a week.    . Multiple Vitamins-Minerals (MULTIVITAMIN WITH MINERALS) tablet Take 1 tablet by mouth daily.    . Omega-3 Fatty Acids (FISH OIL PO) Take 1 capsule by mouth daily.     Marland Kitchen omeprazole (PRILOSEC) 40 MG capsule Take 40 mg by mouth daily.  3  . triamcinolone (NASACORT ALLERGY 24HR) 55 MCG/ACT AERO nasal inhaler  Place 1 spray into both nostrils daily as needed (for congestion).     . vitamin C (ASCORBIC ACID) 500 MG tablet Take 500 mg by mouth daily.     No current facility-administered medications for this visit.     Allergies as of 01/22/2016 - Review Complete 01/22/2016  Allergen Reaction Noted  . Ambien [zolpidem tartrate] Other (See Comments) 01/17/2015  . Penicillins Swelling and Rash 11/13/2009  . Diamox [acetazolamide] Other (See Comments) 01/28/2011  . Other Other (See Comments) 04/08/2015  . Ciprofloxacin Rash 11/13/2009  . Doxycycline Rash 11/13/2009  . Erythromycin Rash 11/13/2009  . Neptazane [methazolamide] Other (See Comments) 01/28/2011    Vitals: BP 92/70   Pulse 72   Resp 20   Ht 5\' 10"  (1.778 m)   Wt 180 lb (81.6 kg)   BMI 25.83 kg/m  Last Weight:  Wt Readings from Last 1 Encounters:  01/22/16 180 lb (81.6 kg)   [id number] mass index is 25.83 kg/m.     Last Height:   Ht Readings from Last 1 Encounters:  01/22/16 5\' 10"  (1.778 m)    Physical exam:  General: The patient is awake, alert and appears not in acute distress. The patient is well groomed. Head: Normocephalic, atraumatic. Neck is supple. Mallampati 3,  neck circumference: 14.5 . Retrognathia is seen.  Cardiovascular:  Regular rate and rhythm , with loud systolic  murmurs or carotid bruit, and without distended neck veins. Respiratory: Lungs are clear to auscultation. Skin:  Without evidence of edema, or rash Trunk: BMI is low  The patient's posture is erect   Neurologic exam : The patient is awake and alert, oriented to place and time.   Memory subjective  described as intact.  Attention span & concentration ability appears normal.  Speech is fluent,  without  dysarthria, dysphonia or aphasia.  Mood and affect are appropriate.  Cranial nerves: Pupils are equal and briskly reactive to light.  Funduscopic exam without  evidence of pallor or edema.  Extraocular movements  in vertical and horizontal  planes intact and without nystagmus.  Visual fields by finger perimetry are intact. Hearing to finger rub intact. Facial sensation intact to  fine touch.  Facial motor strength ; patient is a slight ptosis on the left his left eye is blind due to a retinal detachment, he has a left-sided lower hanging shoulder and he has hired tone in the left biceps I would consider this mildly spastic, he has preserved good grip strength in both hands. Motor exam: right leg has cellulitis, could not be tested.  Sensory:  Coordination: Rapid alternating movements in the fingers/hands was normal. Finger-to-nose maneuver  normal without evidence of ataxia, dysmetria or tremor. Gait and station: Patient walks without assistive device and is able unassisted to climb up to the exam table. Strength within normal limits.  Stance is stable and normal.  Deep tendon reflexes: in the upper and lower extremities are symmetric and intact.   The patient was advised of the nature of the diagnosed sleep disorder , the treatment options and risks for general a health and wellness arising from not treating the condition.  I spent more than 60 minutes of face to face time with the patient. Greater than 50% of time was spent in counseling and coordination of care. We have discussed the diagnosis and differential and I answered the patient's questions.     Assessment:  After physical and neurologic examination, review of laboratory studies,  Personal review of imaging studies, reports of other /same  Imaging studies ,  Results of polysomnography/ neurophysiology testing and pre-existing records as far as provided in visit., my assessment is   1) I suspect that Aaron Lucas has central apnea after reviewing his wife's video and picture evidence. This seems to be no struggle for breath he just pauses very long before he takes another breast and it sounds more like a gasp and a choking. Central apnea risk factors are organ failure, congestive  heart failure, renal failure, hepatic failure central apnea can be induced by tranquilizers or narcotic pain medication which he doesn't take, central apnea can be a sign of a neuro-degenerative process or recent stroke.   Since all these sleep disturbance sees a fairly new to the patient and occurred after his hernia surgery one of the suspicions I have is that he could have suffered a watershed infarct, he does have multiple morbidities that I have already listed above. One of the difficulties of an in lab sleep study as usually required by Medicare is that we don't have hospital bed and this patient clearly requires to be sitting while sleeping.   He actually cannot find a lot of comfort otherwise. To test him at home will require an extension from Medicare for which I will apply. Should this not be given I will order an overnight pulse oximetry. My goal is to distinguish central apnea from obstructive sleep apnea and to document if the patient remains hypoxemic for long periods of time. The treatment would then not be positive airway pressure but oxygen supplementation. Unfortunately I cannot perform a capnography at home either.  2) Rv in 14 days.   3) Xanax 0.25 mg at night  4) Hyponatremia. I could not prescribe Remeron at night and neither Seroquel as bolus have a side effect of inducing more hyponatremia or SIADH.    Asencion Partridge Orson Rho MD  01/22/2016   CC: Burnard Bunting, [address] [place], [place] [id number]

## 2016-01-24 ENCOUNTER — Telehealth: Payer: Self-pay | Admitting: Cardiology

## 2016-01-24 NOTE — Telephone Encounter (Signed)
Per pt returing RN's call and would like a call back please.

## 2016-01-24 NOTE — Telephone Encounter (Signed)
I returned call and advised results of recent labwork OK. Pt voiced understanding and thanks.

## 2016-01-30 ENCOUNTER — Encounter (INDEPENDENT_AMBULATORY_CARE_PROVIDER_SITE_OTHER): Payer: Medicare Other | Admitting: Neurology

## 2016-01-30 DIAGNOSIS — G4737 Central sleep apnea in conditions classified elsewhere: Secondary | ICD-10-CM

## 2016-01-30 DIAGNOSIS — J387 Other diseases of larynx: Secondary | ICD-10-CM

## 2016-01-30 DIAGNOSIS — G4730 Sleep apnea, unspecified: Secondary | ICD-10-CM

## 2016-01-30 DIAGNOSIS — G4733 Obstructive sleep apnea (adult) (pediatric): Secondary | ICD-10-CM | POA: Diagnosis not present

## 2016-01-30 DIAGNOSIS — R432 Parageusia: Secondary | ICD-10-CM

## 2016-01-30 DIAGNOSIS — I350 Nonrheumatic aortic (valve) stenosis: Secondary | ICD-10-CM

## 2016-01-30 DIAGNOSIS — G4700 Insomnia, unspecified: Secondary | ICD-10-CM

## 2016-01-30 DIAGNOSIS — E871 Hypo-osmolality and hyponatremia: Secondary | ICD-10-CM

## 2016-02-01 ENCOUNTER — Encounter (HOSPITAL_COMMUNITY): Payer: Self-pay | Admitting: Emergency Medicine

## 2016-02-01 ENCOUNTER — Emergency Department (HOSPITAL_COMMUNITY): Payer: Medicare Other

## 2016-02-01 ENCOUNTER — Emergency Department (HOSPITAL_COMMUNITY)
Admission: EM | Admit: 2016-02-01 | Discharge: 2016-02-02 | Disposition: A | Discharge status: Home / Self Care | Payer: Medicare Other | Source: Home / Self Care | Attending: Emergency Medicine | Admitting: Emergency Medicine

## 2016-02-01 DIAGNOSIS — I509 Heart failure, unspecified: Secondary | ICD-10-CM | POA: Insufficient documentation

## 2016-02-01 DIAGNOSIS — I110 Hypertensive heart disease with heart failure: Secondary | ICD-10-CM | POA: Diagnosis not present

## 2016-02-01 DIAGNOSIS — I5033 Acute on chronic diastolic (congestive) heart failure: Secondary | ICD-10-CM | POA: Insufficient documentation

## 2016-02-01 DIAGNOSIS — R0602 Shortness of breath: Secondary | ICD-10-CM | POA: Diagnosis not present

## 2016-02-01 LAB — BASIC METABOLIC PANEL
Anion gap: 9 (ref 5–15)
BUN: 24 mg/dL — ABNORMAL HIGH (ref 6–20)
CHLORIDE: 100 mmol/L — AB (ref 101–111)
CO2: 27 mmol/L (ref 22–32)
CREATININE: 1.15 mg/dL (ref 0.61–1.24)
Calcium: 8.6 mg/dL — ABNORMAL LOW (ref 8.9–10.3)
GFR calc non Af Amer: 56 mL/min — ABNORMAL LOW (ref >60)
Glucose, Bld: 112 mg/dL — ABNORMAL HIGH (ref 65–99)
POTASSIUM: 3.8 mmol/L (ref 3.5–5.1)
SODIUM: 136 mmol/L (ref 135–145)

## 2016-02-01 LAB — I-STAT TROPONIN, ED: Troponin i, poc: 0 ng/mL (ref 0.00–0.08)

## 2016-02-01 LAB — CBC
HCT: 35.2 % — ABNORMAL LOW (ref 39.0–52.0)
Hemoglobin: 11.1 g/dL — ABNORMAL LOW (ref 13.0–17.0)
MCH: 26.6 pg (ref 26.0–34.0)
MCHC: 31.5 g/dL (ref 30.0–36.0)
MCV: 84.4 fL (ref 78.0–100.0)
PLATELETS: 141 10*3/uL — AB (ref 150–400)
RBC: 4.17 MIL/uL — AB (ref 4.22–5.81)
RDW: 20.8 % — ABNORMAL HIGH (ref 11.5–15.5)
WBC: 5.7 10*3/uL (ref 4.0–10.5)

## 2016-02-01 NOTE — ED Provider Notes (Signed)
[name] DEPT Provider Note   CSN: [id number] Arrival date & time: 02/01/16  2205  By signing my name below, I, Royce Macadamia, attest that this documentation has been prepared under the direction and in the presence of Dorie Rank, MD . Electronically Signed: Royce Macadamia, Scribe. 02/01/2016. 11:50 PM.  History   Chief Complaint Chief Complaint  Patient presents with  . Palpitations   The history is provided by the patient, medical records and the spouse. No language interpreter was used.     HPI Comments:  Aaron Lucas is a 82 y.o. male with a history of atrial fibrillation who presents to the Emergency Department complaining of intermittent SOB tonight.  He reports his episode lasted for 10 minutes and is now relieved.  Pt states that he was in his bed when his heart started to flutter and he felt SOB.  Pt took Xanex 30 minutes before bed; he's been on it for a week.  Pt states that he is seeing his cardiologist next week for his atrial fibrillation and a heart surgeon due to a mitral valve prolapse; he was goign to have surgery but no longer intends to.  He also reports he is about to undergo a sleep study.  He denies chest pain, fever, vomiting .    Past Medical History:  Diagnosis Date  . Cataracts, bilateral   . Chronic diastolic CHF (congestive heart failure) ([place]) 11/25/2015  . Detached retina Twice   As a result he is lost vision in the left eye.  . Diverticulosis 2006  . Hx of colonic polyps 2006,  2011  . Hypertension   . Paroxysmal atrial fibrillation (HCC)    On Pradaxa.   . Prostate cancer ([place]) 2004   seed implant  . 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 Valvular heart disease    MV regurge and Ao stenosis  . Vertigo     Patient Active Problem List   Diagnosis Date Noted  . Central sleep apnea due to medical condition 01/22/2016  . Insomnia w/ sleep apnea 01/22/2016  . Aortic stenosis, moderate 01/22/2016  . CHF  (congestive heart failure) ([place]) 11/28/2015  . Acute on chronic combined systolic and diastolic CHF, NYHA class 2 ([place]) 11/27/2015  . Preoperative clearance 11/25/2015  . Acute on chronic diastolic heart failure ([place]) 11/25/2015  . Hernia of abdominal cavity   . Left inguinal hernia 11/24/2015  . Hyponatremia 11/24/2015  . Abnormal urinalysis 11/24/2015  . Severe aortic valve stenosis   . Aortic stenosis 03/15/2015  . Lower GI bleeding 11/13/2014  . Melena 11/13/2014  . Hypertension   . Paroxysmal atrial fibrillation (HCC)   . Mitral regurgitation     Past Surgical History:  Procedure Laterality Date  . CARDIAC CATHETERIZATION N/A 04/19/2015   Procedure: Right/Left Heart Cath and Coronary Angiography;  Surgeon: Burnell Blanks, MD;  Location: [facility];  Service: Cardiovascular;  Laterality: N/A;  . CATARACT EXTRACTION    . COLONOSCOPY  2006 and 11/2009   tubular adenomas on both studies. int rrhoids, diverticulosis.   . COLONOSCOPY N/A 11/15/2014   Procedure: COLONOSCOPY;  Surgeon: Milus Banister, MD;  Location: [facility];  Service: Endoscopy;  Laterality: N/A;  . 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 Focal urothelial atypia in 2006.  Marland Kitchen ESOPHAGOGASTRODUODENOSCOPY N/A 11/15/2014   Procedure: ESOPHAGOGASTRODUODENOSCOPY (EGD);  Surgeon: Milus Banister, MD;  Location: [facility]  ENDOSCOPY;  Service: Endoscopy;  Laterality: N/A;  . INGUINAL HERNIA REPAIR Bilateral 11/28/2015   Procedure: BILATERAL INGUINAL HERNIA REPAIR;  Surgeon: Erroll Luna, MD;  Location: [facility];  Service: General;  Laterality: Bilateral;  . INSERTION OF MESH Bilateral 11/28/2015   Procedure: INSERTION OF MESH;  Surgeon: Erroll Luna, MD;  Location: [facility];  Service: General;  Laterality: Bilateral;  . PLEURAL EFFUSION DRAINAGE  2010  . RADIOACTIVE SEED IMPLANT  2004   For treatment of prostate cancer  . SQUAMOUS CELL CARCINOMA  EXCISION  2002, 2003, 2006.  . TEE WITHOUT CARDIOVERSION N/A 05/14/2015   Procedure: TRANSESOPHAGEAL ECHOCARDIOGRAM (TEE);  Surgeon: Larey Dresser, MD;  Location: [facility];  Service: Cardiovascular;  Laterality: N/A;  . THORACENTESIS    . TONSILLECTOMY         Home Medications    Prior to Admission medications   Medication Sig Start Date End Date Taking? Authorizing Provider  ALPRAZolam (XANAX) 0.25 MG tablet Take 1 tablet (0.25 mg total) by mouth at bedtime as needed for anxiety. 01/22/16   Asencion Partridge Dohmeier, MD  clindamycin (CLEOCIN T) 1 % lotion Apply 1 application topically 2 (two) times daily.    Historical Provider, MD  dabigatran (PRADAXA) 150 MG CAPS capsule Take 1 capsule (150 mg total) by mouth every 12 (twelve) hours. 11/14/15   Minus Breeding, MD  furosemide (LASIX) 40 MG tablet Take 1 tablet (40 mg total) by mouth daily. 01/08/16   Rhonda G Barrett, PA-C  lisinopril (PRINIVIL,ZESTRIL) 2.5 MG tablet Take 1 tablet (2.5 mg total) by mouth daily. 12/01/15   Orson Eva, MD  loratadine (CLARITIN) 10 MG tablet Take 10 mg by mouth daily as needed for allergies.     Historical Provider, MD  metroNIDAZOLE (METROCREAM) 0.75 % cream Apply 1 application topically 2 (two) times daily as needed (for skin lesions).     Historical Provider, MD  Misc Natural Products (RED WINE EXTRACT PO) Take 1 tablet by mouth 2 (two) times a week.    Historical Provider, MD  Multiple Vitamins-Minerals (MULTIVITAMIN WITH MINERALS) tablet Take 1 tablet by mouth daily.    Historical Provider, MD  Omega-3 Fatty Acids (FISH OIL PO) Take 1 capsule by mouth daily.     Historical Provider, MD  omeprazole (PRILOSEC) 40 MG capsule Take 40 mg by mouth daily. 12/18/14   Historical Provider, MD  triamcinolone (NASACORT ALLERGY 24HR) 55 MCG/ACT AERO nasal inhaler Place 1 spray into both nostrils daily as needed (for congestion).     Historical Provider, MD  vitamin C (ASCORBIC ACID) 500 MG tablet Take 500 mg by mouth daily.     Historical Provider, MD    Family History Family History  Problem Relation Age of Onset  . Pulmonary embolism Father 73    after surgery  . Cancer Mother   . Other       there is no known coronary artery disease     Social History Social History  Substance Use Topics  . Smoking status: Never Smoker  . Smokeless tobacco: Never Used  . Alcohol use Yes     Comment: occassionally     Allergies   Ambien [zolpidem tartrate]; Penicillins; Diamox [acetazolamide]; Other; Ciprofloxacin; Doxycycline; Erythromycin; and Neptazane [methazolamide]   Review of Systems Review of Systems  All other systems reviewed and are negative.    Physical Exam Updated Vital Signs BP 91/61 (BP Location: Left Arm)   Pulse 87   Temp 97.6 F (36.4 C) (Oral)   Resp  18   SpO2 100%   Physical Exam  Constitutional: No distress.  elderly  HENT:  Head: Normocephalic and atraumatic.  Right Ear: External ear normal.  Left Ear: External ear normal.  Eyes: Conjunctivae are normal. Right eye exhibits no discharge. Left eye exhibits no discharge. No scleral icterus.  Neck: Neck supple. No tracheal deviation present.  Cardiovascular: Normal rate, regular rhythm and intact distal pulses.   Murmur heard.  Systolic murmur is present  Pulmonary/Chest: Effort normal and breath sounds normal. No stridor. No respiratory distress. He has no wheezes. He has no rales.  Abdominal: Soft. Bowel sounds are normal. He exhibits no distension. There is no tenderness. There is no rebound and no guarding.  Musculoskeletal: He exhibits no edema or tenderness.  Neurological: He is alert. He has normal strength. No cranial nerve deficit (no facial droop, extraocular movements intact, no slurred speech) or sensory deficit. He exhibits normal muscle tone. He displays no seizure activity. Coordination normal.  Skin: Skin is warm and dry. No rash noted.  Psychiatric: He has a normal mood and affect.  Nursing note and vitals  reviewed.    ED Treatments / Results   DIAGNOSTIC STUDIES:  Oxygen Saturation is 100% on RA, NML by my interpretation.    COORDINATION OF CARE:  11:49 PM Discussed treatment plan with pt at bedside and pt agreed to plan.  Labs (all labs ordered are listed, but only abnormal results are displayed) Labs Reviewed  BASIC METABOLIC PANEL - Abnormal; Notable for the following:       Result Value   Chloride 100 (*)    Glucose, Bld 112 (*)    BUN 24 (*)    Calcium 8.6 (*)    GFR calc non Af Amer 56 (*)    All other components within normal limits  CBC - Abnormal; Notable for the following:    RBC 4.17 (*)    Hemoglobin 11.1 (*)    HCT 35.2 (*)    RDW 20.8 (*)    Platelets 141 (*)    All other components within normal limits  I-STAT TROPOININ, ED    EKG  EKG Interpretation  Date/Time:  Saturday February 01 2016 22:16:05 EDT Ventricular Rate:  88 PR Interval:  192 QRS Duration: 100 QT Interval:  422 QTC Calculation: 510 R Axis:   24 Text Interpretation:  Sinus rhythm with occasional and consecutive Premature ventricular complexes Nonspecific T wave abnormality Prolonged QT Abnormal ECG qt duration increased since last tracing Confirmed by Jannah Guardiola  MD-J, Avraham Benish [id number]) on 02/01/2016 11:45:15 PM       Radiology Dg Chest 2 View  Result Date: 02/01/2016 CLINICAL DATA:  Chest fluttering sensation. EXAM: CHEST  2 VIEW COMPARISON:  01/17/2016 FINDINGS: There is borderline cardiomegaly with mild CHF and small pleural effusions. Superimposed infiltrates at the lung bases are not entirely excluded. Old left-sided third through ninth rib fractures. Mitral annular calcifications are present. No acute osseous abnormality. IMPRESSION: Borderline cardiomegaly with findings consistent with mild CHF and small pleural effusions. Superimposed pneumonic airspace disease at the lung bases is not entirely excluded. Electronically Signed   By: Ashley Royalty M.D.   On: 02/01/2016 23:52     Procedures Procedures (including critical care time)  Medications Ordered in ED Medications - No data to display   Initial Impression / Assessment and Plan / ED Course  I have reviewed the triage vital signs and the nursing notes.  Pertinent labs & imaging results that were available during my  care of the patient were reviewed by me and considered in my medical decision making (see chart for details).  Clinical Course  Value Comment By Time   Asymptomatic now.  Will add on bnp, check serial troponin and monitor in the ED Dorie Rank, MD 10/22 0011  Hemoglobin: (!) 11.1 Stable compared to prior labs Dorie Rank, MD 10/22 0012   I discussed the findings with the patient.  I suspect he is having a mild chf exacerbation.   He has been seeing his cardiologist recently and they have been trying to manage it.  Goal weight is 174 lb and he has been running a little high at home, 175.  Discussed admitting for treatment vs outpatient management.  Pt wants to go home.  He is breathing fine now.  Will give dose of lasix in the ED.  He has a cardiology appointment on Monday. Dorie Rank, MD 10/22 0144   BNP is elevated.  Previous values also elevated but higher than before. CXR with mild CHF.  Sx could be related to mild chf exacerbation.  Discussed findings with patient and wife.  He would like to go home.  He is feeling fine now.  Dose of lasix given in the ED.  Will have him continue lasix bid.  Follow up in the office on Monday as planned.  Final Clinical Impressions(s) / ED Diagnoses   Final diagnoses:  Acute on chronic congestive heart failure, unspecified congestive heart failure type [facility])    New Prescriptions New Prescriptions   No medications on file   I personally performed the services described in this documentation, which was scribed in my presence.  The recorded information has been reviewed and is accurate.     Dorie Rank, MD 02/02/16 [phone]

## 2016-02-01 NOTE — ED Triage Notes (Signed)
Patient arrives with complaint of fluttering feeling in left chest. Denies other symptoms. States "my heart ain't acting right".

## 2016-02-02 LAB — I-STAT TROPONIN, ED: Troponin i, poc: 0.01 ng/mL (ref 0.00–0.08)

## 2016-02-02 LAB — MAGNESIUM: MAGNESIUM: 2.2 mg/dL (ref 1.7–2.4)

## 2016-02-02 LAB — BRAIN NATRIURETIC PEPTIDE: B Natriuretic Peptide: 2743.2 pg/mL — ABNORMAL HIGH (ref 0.0–100.0)

## 2016-02-02 MED ORDER — FUROSEMIDE 10 MG/ML IJ SOLN
40.0000 mg | Freq: Once | INTRAMUSCULAR | Status: AC
  Administered 2016-02-02: 40 mg via INTRAVENOUS
  Filled 2016-02-02: qty 4

## 2016-02-02 NOTE — Discharge Instructions (Signed)
Continue the lasix at 40 mg twice daily.  An additional dose was given in the ED today.  Monitor the fluid intake and weight.  Follow up with the cardiologist on Monday as planned

## 2016-02-03 ENCOUNTER — Emergency Department (HOSPITAL_COMMUNITY): Payer: Medicare Other

## 2016-02-03 ENCOUNTER — Inpatient Hospital Stay (HOSPITAL_COMMUNITY)
Admission: EM | Admit: 2016-02-03 | Discharge: 2016-02-08 | DRG: 291 | Disposition: A | Discharge status: Skilled Nursing Facility | Payer: Medicare Other | Attending: Internal Medicine | Admitting: Internal Medicine

## 2016-02-03 ENCOUNTER — Encounter: Payer: Self-pay | Admitting: Physician Assistant

## 2016-02-03 ENCOUNTER — Encounter (HOSPITAL_COMMUNITY): Payer: Self-pay

## 2016-02-03 ENCOUNTER — Ambulatory Visit (INDEPENDENT_AMBULATORY_CARE_PROVIDER_SITE_OTHER): Payer: Medicare Other | Admitting: Physician Assistant

## 2016-02-03 VITALS — BP 90/62 | HR 85 | Ht 70.0 in | Wt 176.6 lb

## 2016-02-03 DIAGNOSIS — F5101 Primary insomnia: Secondary | ICD-10-CM

## 2016-02-03 DIAGNOSIS — I480 Paroxysmal atrial fibrillation: Secondary | ICD-10-CM | POA: Diagnosis not present

## 2016-02-03 DIAGNOSIS — I5043 Acute on chronic combined systolic (congestive) and diastolic (congestive) heart failure: Secondary | ICD-10-CM | POA: Diagnosis not present

## 2016-02-03 DIAGNOSIS — R0601 Orthopnea: Secondary | ICD-10-CM

## 2016-02-03 DIAGNOSIS — M6281 Muscle weakness (generalized): Secondary | ICD-10-CM | POA: Diagnosis present

## 2016-02-03 DIAGNOSIS — J9601 Acute respiratory failure with hypoxia: Secondary | ICD-10-CM | POA: Diagnosis present

## 2016-02-03 DIAGNOSIS — I080 Rheumatic disorders of both mitral and aortic valves: Secondary | ICD-10-CM | POA: Diagnosis present

## 2016-02-03 DIAGNOSIS — Z8546 Personal history of malignant neoplasm of prostate: Secondary | ICD-10-CM

## 2016-02-03 DIAGNOSIS — G4737 Central sleep apnea in conditions classified elsewhere: Secondary | ICD-10-CM | POA: Diagnosis present

## 2016-02-03 DIAGNOSIS — K5901 Slow transit constipation: Secondary | ICD-10-CM | POA: Diagnosis present

## 2016-02-03 DIAGNOSIS — Z881 Allergy status to other antibiotic agents status: Secondary | ICD-10-CM

## 2016-02-03 DIAGNOSIS — Z66 Do not resuscitate: Secondary | ICD-10-CM | POA: Diagnosis present

## 2016-02-03 DIAGNOSIS — R079 Chest pain, unspecified: Secondary | ICD-10-CM | POA: Diagnosis present

## 2016-02-03 DIAGNOSIS — Z7189 Other specified counseling: Secondary | ICD-10-CM

## 2016-02-03 DIAGNOSIS — I2510 Atherosclerotic heart disease of native coronary artery without angina pectoris: Secondary | ICD-10-CM | POA: Diagnosis present

## 2016-02-03 DIAGNOSIS — Z888 Allergy status to other drugs, medicaments and biological substances status: Secondary | ICD-10-CM

## 2016-02-03 DIAGNOSIS — H5462 Unqualified visual loss, left eye, normal vision right eye: Secondary | ICD-10-CM | POA: Diagnosis present

## 2016-02-03 DIAGNOSIS — I10 Essential (primary) hypertension: Secondary | ICD-10-CM | POA: Diagnosis present

## 2016-02-03 DIAGNOSIS — Z515 Encounter for palliative care: Secondary | ICD-10-CM | POA: Diagnosis not present

## 2016-02-03 DIAGNOSIS — R0902 Hypoxemia: Secondary | ICD-10-CM

## 2016-02-03 DIAGNOSIS — I350 Nonrheumatic aortic (valve) stenosis: Secondary | ICD-10-CM

## 2016-02-03 DIAGNOSIS — R0602 Shortness of breath: Secondary | ICD-10-CM

## 2016-02-03 DIAGNOSIS — Z7901 Long term (current) use of anticoagulants: Secondary | ICD-10-CM

## 2016-02-03 DIAGNOSIS — Z8551 Personal history of malignant neoplasm of bladder: Secondary | ICD-10-CM

## 2016-02-03 DIAGNOSIS — I959 Hypotension, unspecified: Secondary | ICD-10-CM | POA: Diagnosis present

## 2016-02-03 DIAGNOSIS — I509 Heart failure, unspecified: Secondary | ICD-10-CM

## 2016-02-03 DIAGNOSIS — I110 Hypertensive heart disease with heart failure: Principal | ICD-10-CM | POA: Diagnosis present

## 2016-02-03 DIAGNOSIS — Z880 Allergy status to penicillin: Secondary | ICD-10-CM

## 2016-02-03 LAB — CBC
HCT: 36.5 % — ABNORMAL LOW (ref 39.0–52.0)
Hemoglobin: 11.4 g/dL — ABNORMAL LOW (ref 13.0–17.0)
MCH: 26.7 pg (ref 26.0–34.0)
MCHC: 31.2 g/dL (ref 30.0–36.0)
MCV: 85.5 fL (ref 78.0–100.0)
PLATELETS: 140 10*3/uL — AB (ref 150–400)
RBC: 4.27 MIL/uL (ref 4.22–5.81)
RDW: 20.8 % — AB (ref 11.5–15.5)
WBC: 5.4 10*3/uL (ref 4.0–10.5)

## 2016-02-03 LAB — BASIC METABOLIC PANEL
Anion gap: 8 (ref 5–15)
BUN: 23 mg/dL — ABNORMAL HIGH (ref 6–20)
CALCIUM: 8.8 mg/dL — AB (ref 8.9–10.3)
CHLORIDE: 98 mmol/L — AB (ref 101–111)
CO2: 29 mmol/L (ref 22–32)
CREATININE: 1.07 mg/dL (ref 0.61–1.24)
Glucose, Bld: 110 mg/dL — ABNORMAL HIGH (ref 65–99)
Potassium: 3.5 mmol/L (ref 3.5–5.1)
SODIUM: 135 mmol/L (ref 135–145)

## 2016-02-03 LAB — I-STAT TROPONIN, ED: TROPONIN I, POC: 0 ng/mL (ref 0.00–0.08)

## 2016-02-03 NOTE — Progress Notes (Signed)
Cardiology Office Note   Date:  02/03/2016   ID:  Aaron Lucas, DOB October 29, 1928, MRN [id number]  PCP:  Aaron Lyons, MD  Cardiologist:  Dr Aaron Lucas 11/14/2015  Aaron Ferries, PA-C   Chief Complaint  Patient presents with  . Follow-up    1 MONTH    History of Present Illness: Aaron Lucas is a 82 y.o. male with a history of D-CHF, HTN, PAF, MV regurg &AS, prostate CA, DNR.   09/06 o.v. For CHF, Lasix increased 09/27 o.v. For insomnia and orthopnea, dry wt goal 175 lbs, Lasix increased temporarily and prn 10/21 ER visit for intermittent shortness of breath, status post Lasix 40 mg IV 1 with improvement in his shortness of breath and a decrease in his weight down to 172 pounds  Aaron Lucas presents for Follow-up and management of his CHF.  He has been diet and medication compliant. He has been checking his daily weights, his wife checks it or than once a day. He has not gotten extra Lasix very many times. When his Lasix was temporarily increased, his weight gradually trended down to about 175. He plateaued there.  On 10/21, his weight was not up significantly, but his shortness of breath was worse. In the emergency room, his BNP was very elevated. His symptoms improved with one dose of IV Lasix. Since then, his home weight has decreased down to 172 or 173 pounds. His breathing is better. His energy level is a little bit better, but he still tires extremely easily. He has physical therapy coming to the house and is afraid they will stop coming soon. This worries him a great deal because he does not feel like he can continue to make progress without the help of physical therapy.  His lower extremity edema has improved with decrease in his weight. He has compression socks on and he has only trace edema on the left. The right leg is still wrapped and not disturbed. His right leg is improved and no longer weeping.  He feels like he is increasing his endurance that time.  He has not had chest pain. He has not had significant palpitations and does not feel like he has had any atrial fibrillation. He is not having any bleeding issues.  Since he when seen last by cardiology, he has been seen by Aaron Lucas and had a sleep study performed in the home. He does not have the results. In reading her note, her suspicion was that he had central apnea versus obstructive sleep apnea, but clearly he is unable to sleep except sitting up. She put him on some medication. Between the medication and the hospital bed so that he can sleep in a more upright position, he is getting 3-4 hours of sleep at a time.   Past Medical History:  Diagnosis Date  . Cataracts, bilateral   . Chronic diastolic CHF (congestive heart failure) ([place]) 11/25/2015  . Detached retina Twice   As a result he is lost vision in the left eye.  . Diverticulosis 2006  . Hx of colonic polyps 2006,  2011  . Hypertension   . Paroxysmal atrial fibrillation (HCC)    On Pradaxa.   . Prostate cancer ([place]) 2004   seed implant  . 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 Valvular heart disease    MV regurge and Ao stenosis  . Vertigo     Past Surgical History:  Procedure Laterality Date  .  CARDIAC CATHETERIZATION N/A 04/19/2015   Procedure: Right/Left Heart Cath and Coronary Angiography;  Surgeon: Burnell Blanks, MD;  Location: [facility];  Service: Cardiovascular;  Laterality: N/A;  . CATARACT EXTRACTION    . COLONOSCOPY  2006 and 11/2009   tubular adenomas on both studies. int rrhoids, diverticulosis.   . COLONOSCOPY N/A 11/15/2014   Procedure: COLONOSCOPY;  Surgeon: Milus Banister, MD;  Location: [facility];  Service: Endoscopy;  Laterality: N/A;  . 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 Focal urothelial atypia in 2006.  Marland Kitchen ESOPHAGOGASTRODUODENOSCOPY N/A 11/15/2014   Procedure:  ESOPHAGOGASTRODUODENOSCOPY (EGD);  Surgeon: Milus Banister, MD;  Location: [facility];  Service: Endoscopy;  Laterality: N/A;  . INGUINAL HERNIA REPAIR Bilateral 11/28/2015   Procedure: BILATERAL INGUINAL HERNIA REPAIR;  Surgeon: Erroll Luna, MD;  Location: [facility];  Service: General;  Laterality: Bilateral;  . INSERTION OF MESH Bilateral 11/28/2015   Procedure: INSERTION OF MESH;  Surgeon: Erroll Luna, MD;  Location: [facility];  Service: General;  Laterality: Bilateral;  . PLEURAL EFFUSION DRAINAGE  2010  . RADIOACTIVE SEED IMPLANT  2004   For treatment of prostate cancer  . SQUAMOUS CELL CARCINOMA EXCISION  2002, 2003, 2006.  . TEE WITHOUT CARDIOVERSION N/A 05/14/2015   Procedure: TRANSESOPHAGEAL ECHOCARDIOGRAM (TEE);  Surgeon: Larey Dresser, MD;  Location: [facility];  Service: Cardiovascular;  Laterality: N/A;  . THORACENTESIS    . TONSILLECTOMY      Current Outpatient Prescriptions  Medication Sig Dispense Refill  . ALPRAZolam (XANAX) 0.25 MG tablet Take 1 tablet (0.25 mg total) by mouth at bedtime as needed for anxiety. 30 tablet 0  . clindamycin (CLEOCIN T) 1 % lotion Apply 1 application topically as needed (RASH).     Marland Kitchen dabigatran (PRADAXA) 150 MG CAPS capsule Take 1 capsule (150 mg total) by mouth every 12 (twelve) hours. 180 capsule 3  . furosemide (LASIX) 40 MG tablet Take 1 tablet (40 mg total) by mouth daily. 60 tablet 3  . lisinopril (PRINIVIL,ZESTRIL) 2.5 MG tablet Take 1 tablet (2.5 mg total) by mouth daily. 30 tablet 0  . loratadine (CLARITIN) 10 MG tablet Take 10 mg by mouth daily as needed for allergies.     . metroNIDAZOLE (METROCREAM) 0.75 % cream Apply 1 application topically 2 (two) times daily as needed (for skin lesions).     . Multiple Vitamins-Minerals (MULTIVITAMIN WITH MINERALS) tablet Take 1 tablet by mouth daily.    . Omega-3 Fatty Acids (FISH OIL PO) Take 1 capsule by mouth daily.     Marland Kitchen omeprazole (PRILOSEC) 40 MG capsule Take 40 mg by mouth daily.  3  .  triamcinolone (NASACORT ALLERGY 24HR) 55 MCG/ACT AERO nasal inhaler Place 1 spray into both nostrils daily as needed (for congestion).     . vitamin C (ASCORBIC ACID) 500 MG tablet Take 500 mg by mouth daily.     No current facility-administered medications for this visit.     Allergies:   Ambien [zolpidem tartrate]; Diamox [acetazolamide]; Other; Penicillins; Ciprofloxacin; Doxycycline; Erythromycin; and Neptazane [methazolamide]    Social History:  The patient  reports that he has never smoked. He has never used smokeless tobacco. He reports that he drinks alcohol. He reports that he does not use drugs.   Family History:  The patient's family history includes Cancer in his mother; Pulmonary embolism (age of onset: 1) in his father.   ROS:  Please see  the history of present illness. All other systems are reviewed and negative.   PHYSICAL EXAM: VS:  BP 90/62   Pulse 85   Ht 5\' 10"  (1.778 m)   Wt 176 lb 9.6 oz (80.1 kg)   SpO2 98%   BMI 25.34 kg/m  , BMI Body mass index is 25.34 kg/m. GEN: Well nourished,Frail elderly, male in no acute distress  HEENT: normal for age  Neck: Minimal JVD, mildly positive hepatojugular reflux, no carotid bruit, no masses Cardiac: RRR; 2/6 murmur, no rubs, or gallops Respiratory:  Decreased breath sounds bases but clear bilaterally, normal work of breathing GI: soft, nontender, nondistended, + BS MS: no deformity or atrophy; trace edema on the left, right lower extremity is wrapped and not disturbed; distal pulses are 2+ in upper extremities   Skin: warm and dry, no rash Neuro:  Strength and sensation are intact Psych: euthymic mood, full affect   EKG:  EKG is not ordered today. ER ECG from 10/21 a sinus rhythm with occasional PVCs.  Recent Labs: 12/01/2015: ALT 19 02/01/2016: B Natriuretic Peptide 2,743.2; BUN 24; Creatinine, Ser 1.15; Hemoglobin 11.1; Magnesium 2.2; Platelets 141; Potassium 3.8; Sodium 136    Lipid Panel No results found for:  CHOL, TRIG, HDL, CHOLHDL, VLDL, LDLCALC, LDLDIRECT   Wt Readings from Last 3 Encounters:  02/03/16 176 lb 9.6 oz (80.1 kg)  01/22/16 180 lb (81.6 kg)  01/08/16 182 lb 3.2 oz (82.6 kg)     Other studies Reviewed: Additional studies/ records that were reviewed today include: Office notes, hospital records and testing.  ASSESSMENT AND PLAN:  1.  Chronic combined systolic and diastolic CHF: His weight is down 272 pounds on his home scales. I feel this is a good dry weight for him. Make that his goal weight. He is to continue the Lasix at 40 mg daily and increase it to 80 mg once a day as needed for weight gain. If this is not successful in controlling his weight he can have Zaroxolyn 2.5 mg daily when necessary added to his medication regimen. His creatinine has been stable on his current medication regimen, so I am reluctant to increase his Lasix to 80 mg daily but this could also be done. His wife is watching his volume in sodium intake very carefully. Follow-up with Dr. Percival Lucas is scheduled. His labs were just checked in the emergency room, they can be checked again a visit with Dr. Percival Lucas.  2. Insomnia: He clearly has severe orthopnea, this is improved by these hospital bed. However, he also has either central or obstructive sleep apnea. Evaluation for this is ongoing and he and his wife will follow-up on this.  3. PAF: He is not having any palpitations and was in sinus rhythm when he was in the emergency room. Continue anticoagulation with Pradaxa. CHA2DS2VASc=4 (age x 2, CHF, HTN).   Current medicines are reviewed at length with the patient today.  The patient does not have concerns regarding medicines.  The following changes have been made:  Take an extra Lasix tablet daily when necessary  Labs/ tests ordered today include:   Orders Placed This Encounter  Procedures  . Basic metabolic panel     Disposition:   FU with Dr. Percival Lucas  Signed, Aaron Ferries, PA-C  02/03/2016  3:19 PM    [facility] Phone: [phone]; Fax: [phone]  This note was written with the assistance of speech recognition software. Please excuse any transcriptional errors.

## 2016-02-03 NOTE — ED Provider Notes (Signed)
[facility] DEPT Provider Note   CSN: [id number] Arrival date & time: 02/03/16  2206  By signing my name below, I, Royce Macadamia, attest that this documentation has been prepared under the direction and in the presence of Everlene Balls, MD . Electronically Signed: Royce Macadamia, Scribe. 02/03/2016. 11:50 PM.  History   Chief Complaint Chief Complaint  Patient presents with  . Shortness of Breath    The history is provided by the patient and medical records. No language interpreter was used.     HPI Comments:  Aaron Lucas is a 82 y.o. male who presents to the Emergency Department complaining of shortness of breath tonight.  Pt states he went to bed, took a xanex, and began feeling "unlike he normally feels."  During the day pt notes feeling SOB and heart racing on exertion.  His SOB is worse when lying down, but he reports he doesn't lie completely flat, and on exertion.  Pt was in the ED 3 days ago for the same complaint.  Pt was given a diuretic in the ED and states his weight went down and hasn't gone back up.  Pt underwent a sleep apnea test an was prescribed 0.25 mg  xanex; he states he has been on it for a week.  Pt has an appointment with his cardiologist in 10 days.  Pt has a wound on his left leg which is dressed.  Pt denies pain, cough and fever.     Past Medical History:  Diagnosis Date  . Cataracts, bilateral   . Chronic diastolic CHF (congestive heart failure) ([place]) 11/25/2015  . Detached retina Twice   As a result he is lost vision in the left eye.  . Diverticulosis 2006  . Hx of colonic polyps 2006,  2011  . Hypertension   . Paroxysmal atrial fibrillation (HCC)    On Pradaxa.   . Prostate cancer ([place]) 2004   seed implant  . 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 Valvular heart disease    MV regurge and Ao stenosis  . Vertigo     Patient Active Problem List   Diagnosis Date Noted  . CHF exacerbation ([place]) 02/04/2016  .  Central sleep apnea due to medical condition 01/22/2016  . Insomnia w/ sleep apnea 01/22/2016  . Aortic stenosis, moderate 01/22/2016  . CHF (congestive heart failure) ([place]) 11/28/2015  . Acute on chronic combined systolic and diastolic CHF, NYHA class 2 ([place]) 11/27/2015  . Preoperative clearance 11/25/2015  . Acute on chronic diastolic heart failure ([place]) 11/25/2015  . Hernia of abdominal cavity   . Left inguinal hernia 11/24/2015  . Hyponatremia 11/24/2015  . Abnormal urinalysis 11/24/2015  . Severe aortic valve stenosis   . Aortic stenosis 03/15/2015  . Lower GI bleeding 11/13/2014  . Melena 11/13/2014  . Hypertension   . Paroxysmal atrial fibrillation (HCC)   . Mitral regurgitation     Past Surgical History:  Procedure Laterality Date  . CARDIAC CATHETERIZATION N/A 04/19/2015   Procedure: Right/Left Heart Cath and Coronary Angiography;  Surgeon: Burnell Blanks, MD;  Location: [facility];  Service: Cardiovascular;  Laterality: N/A;  . CATARACT EXTRACTION    . COLONOSCOPY  2006 and 11/2009   tubular adenomas on both studies. int rrhoids, diverticulosis.   . COLONOSCOPY N/A 11/15/2014   Procedure: COLONOSCOPY;  Surgeon: Milus Banister, MD;  Location: [facility];  Service: Endoscopy;  Laterality: N/A;  . 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 Focal urothelial atypia in 2006.  Marland Kitchen ESOPHAGOGASTRODUODENOSCOPY N/A 11/15/2014   Procedure: ESOPHAGOGASTRODUODENOSCOPY (EGD);  Surgeon: Milus Banister, MD;  Location: [facility];  Service: Endoscopy;  Laterality: N/A;  . INGUINAL HERNIA REPAIR Bilateral 11/28/2015   Procedure: BILATERAL INGUINAL HERNIA REPAIR;  Surgeon: Erroll Luna, MD;  Location: [facility];  Service: General;  Laterality: Bilateral;  . INSERTION OF MESH Bilateral 11/28/2015   Procedure: INSERTION OF MESH;  Surgeon: Erroll Luna, MD;  Location: [facility];  Service: General;  Laterality:  Bilateral;  . PLEURAL EFFUSION DRAINAGE  2010  . RADIOACTIVE SEED IMPLANT  2004   For treatment of prostate cancer  . SQUAMOUS CELL CARCINOMA EXCISION  2002, 2003, 2006.  . TEE WITHOUT CARDIOVERSION N/A 05/14/2015   Procedure: TRANSESOPHAGEAL ECHOCARDIOGRAM (TEE);  Surgeon: Larey Dresser, MD;  Location: [facility];  Service: Cardiovascular;  Laterality: N/A;  . THORACENTESIS    . TONSILLECTOMY         Home Medications    Prior to Admission medications   Medication Sig Start Date End Date Taking? Authorizing Provider  ALPRAZolam (XANAX) 0.25 MG tablet Take 1 tablet (0.25 mg total) by mouth at bedtime as needed for anxiety. 01/22/16  Yes Carmen Dohmeier, MD  clindamycin (CLEOCIN T) 1 % lotion Apply 1 application topically as needed (RASH).    Yes Historical Provider, MD  dabigatran (PRADAXA) 150 MG CAPS capsule Take 1 capsule (150 mg total) by mouth every 12 (twelve) hours. 11/14/15  Yes Minus Breeding, MD  furosemide (LASIX) 40 MG tablet Take 1 tablet (40 mg total) by mouth daily. 01/08/16  Yes Rhonda G Barrett, PA-C  lisinopril (PRINIVIL,ZESTRIL) 2.5 MG tablet Take 1 tablet (2.5 mg total) by mouth daily. 12/01/15  Yes Orson Eva, MD  loratadine (CLARITIN) 10 MG tablet Take 10 mg by mouth daily as needed for allergies.    Yes Historical Provider, MD  metroNIDAZOLE (METROCREAM) 0.75 % cream Apply 1 application topically 2 (two) times daily as needed (for skin lesions).    Yes Historical Provider, MD  Multiple Vitamins-Minerals (MULTIVITAMIN WITH MINERALS) tablet Take 1 tablet by mouth daily.   Yes Historical Provider, MD  Omega-3 Fatty Acids (FISH OIL PO) Take 1 capsule by mouth daily.    Yes Historical Provider, MD  omeprazole (PRILOSEC) 40 MG capsule Take 40 mg by mouth daily. 12/18/14  Yes Historical Provider, MD  triamcinolone (NASACORT ALLERGY 24HR) 55 MCG/ACT AERO nasal inhaler Place 1 spray into both nostrils daily as needed (for congestion).    Yes Historical Provider, MD  vitamin C  (ASCORBIC ACID) 500 MG tablet Take 500 mg by mouth daily.   Yes Historical Provider, MD    Family History Family History  Problem Relation Age of Onset  . Pulmonary embolism Father 14    after surgery  . Cancer Mother   . Other       there is no known coronary artery disease   . Heart attack Neg Hx     Social History Social History  Substance Use Topics  . Smoking status: Never Smoker  . Smokeless tobacco: Never Used  . Alcohol use Yes     Comment: occassionally     Allergies   Ambien [zolpidem tartrate]; Diamox [acetazolamide]; Other; Penicillins; Ciprofloxacin; Doxycycline; Erythromycin; and Neptazane [methazolamide]   Review of Systems Review of Systems  Constitutional: Negative for fever.  Respiratory: Positive for shortness of breath. Negative for cough.   Cardiovascular:  Positive for chest pain.  All other systems reviewed and are negative.    Physical Exam Updated Vital Signs BP 96/83   Pulse 87   Temp 97.7 F (36.5 C) (Oral)   Resp 20   SpO2 100%   Physical Exam  Constitutional: He is oriented to person, place, and time. Vital signs are normal. He appears well-developed and well-nourished.  Non-toxic appearance. He does not appear ill. No distress.  HENT:  Head: Normocephalic and atraumatic.  Nose: Nose normal.  Mouth/Throat: Oropharynx is clear and moist. No oropharyngeal exudate.  Neck: Normal range of motion. Neck supple. No tracheal deviation, no edema, no erythema and normal range of motion present. No thyroid mass and no thyromegaly present.  Cardiovascular: Normal rate, regular rhythm, S1 normal, S2 normal, normal heart sounds, intact distal pulses and normal pulses.  Exam reveals no gallop and no friction rub.   No murmur heard. Pulmonary/Chest: Effort normal and breath sounds normal. No respiratory distress. He has no wheezes. He has no rhonchi. He has no rales.  Abdominal: Soft. Normal appearance and bowel sounds are normal. He exhibits no  distension, no ascites and no mass. There is no hepatosplenomegaly. There is no tenderness. There is no rebound, no guarding and no CVA tenderness.  Musculoskeletal: Normal range of motion. He exhibits edema. He exhibits no tenderness.  BLE edema.  Right lower extremity wound dressed with an ACE wrap.    Lymphadenopathy:    He has no cervical adenopathy.  Neurological: He is alert and oriented to person, place, and time. He has normal strength. No cranial nerve deficit or sensory deficit.  Skin: Skin is warm, dry and intact. No petechiae and no rash noted. He is not diaphoretic. No erythema. No pallor.  Nursing note and vitals reviewed.  ED Treatments / Results   DIAGNOSTIC STUDIES:  Oxygen Saturation is 100% on RA, NML by my interpretation.    COORDINATION OF CARE:  11:50 PM Discussed treatment plan with pt at bedside and pt agreed to plan.  Labs (all labs ordered are listed, but only abnormal results are displayed) Labs Reviewed  BASIC METABOLIC PANEL - Abnormal; Notable for the following:       Result Value   Chloride 98 (*)    Glucose, Bld 110 (*)    BUN 23 (*)    Calcium 8.8 (*)    All other components within normal limits  CBC - Abnormal; Notable for the following:    Hemoglobin 11.4 (*)    HCT 36.5 (*)    RDW 20.8 (*)    Platelets 140 (*)    All other components within normal limits  BRAIN NATRIURETIC PEPTIDE  I-STAT TROPOININ, ED    EKG  EKG Interpretation  Date/Time:  Monday February 03 2016 22:11:01 EDT Ventricular Rate:  86 PR Interval:  214 QRS Duration: 94 QT Interval:  376 QTC Calculation: 449 R Axis:   31 Text Interpretation:  Sinus rhythm with 1st degree A-V block Septal infarct , age undetermined Abnormal ECG PVC no longer present Confirmed by Glynn Octave [phone]) on 02/04/2016 12:00:59 AM       Radiology Dg Chest 2 View  Result Date: 02/03/2016 CLINICAL DATA:  Shortness of breath tonight. History of hypertension, recurrent bladder  and prostate cancer, and heart disease. EXAM: CHEST  2 VIEW COMPARISON:  Chest radiograph February 01, 2016 FINDINGS: Cardiac silhouette is mildly enlarged and unchanged. Calcified aortic knob. Similar LEFT and increasing RIGHT small pleural effusions with bibasilar atelectasis.  Strandy densities LEFT lung base. Pulmonary vascular congestion and mild interstitial prominence. No pneumothorax. Old LEFT posterolateral rib fractures. IMPRESSION: CHF with increasing small pleural effusions. LEFT lung base atelectasis versus confluent edema, less likely pneumonia. Electronically Signed   By: Elon Alas M.D.   On: 02/03/2016 23:19    Procedures Procedures (including critical care time)  Medications Ordered in ED Medications - No data to display   Initial Impression / Assessment and Plan / ED Course  I have reviewed the triage vital signs and the nursing notes.  Pertinent labs & imaging results that were available during my care of the patient were reviewed by me and considered in my medical decision making (see chart for details).  Clinical Course    Patient presents to the ED for worsening SOB.  He is a bounceback patient and was recently seen with the same.  He was given IV lasix and sent home however this did not work.  His effusions are now worse.  Patient will benefit from admission and a couple of days of diuresis.  He is currently 86% on RA while I am in the room.  States he gets very SOB with exertion and can not even go to the [facility] anymore.  Will page triad hospitalist for further care.  12:11 AM Dr. Roel Cluck accepts patient to tele for further care.  Final Clinical Impressions(s) / ED Diagnoses   Final diagnoses:  Acute on chronic congestive heart failure, unspecified congestive heart failure type ([place])  Hypoxia    New Prescriptions New Prescriptions   No medications on file   I personally performed the services described in this documentation, which was scribed in my  presence. The recorded information has been reviewed and is accurate.      Everlene Balls, MD 02/04/16 [phone]

## 2016-02-03 NOTE — Patient Instructions (Addendum)
Medication Instructions:   1. YOU MAY TAKE AN EXTRA LASIX 40 MG TABLET IF NEEDED ( 80 MG DAILY  )  ( FOR WEIGHT GAIN OF 3LB IN 24 HOURS AND 5LBS IN A WEEK )  2. HOLD LISINOPRIL WHEN DIZZY OR LIGHTHEADED AND ON ANY  DAY YOU TAKE AN EXTRA LASIX  If you need a refill on your cardiac medications before your next appointment, please call your pharmacy.  Labwork: BMET ON  THE DAY OF APPOINTMENT WITH DR Lolly Mustache   Testing/Procedures: NONE ORDER TODAY     Follow-Up: KEEP APPOINTMENT WITH DR [facility]    Any Other Special Instructions Will Be Listed Below (If Applicable).

## 2016-02-03 NOTE — ED Notes (Addendum)
Pt O2 Sat at 86% placed pt on 3L O2 now Sat at 94%

## 2016-02-03 NOTE — ED Notes (Signed)
Main lab contacted to add BNP to current draw.

## 2016-02-03 NOTE — ED Triage Notes (Signed)
Pt states about an hour ago before going to bed he started having SOB; pt denies cough or any other symptomes; Pt a&ox 4 on arrival; Pt speaking in full sentences on arrival; Pt denies pain at triage; Pt states he was seen for the same a few days ago;

## 2016-02-04 ENCOUNTER — Encounter (HOSPITAL_COMMUNITY): Payer: Self-pay | Admitting: Internal Medicine

## 2016-02-04 DIAGNOSIS — R079 Chest pain, unspecified: Secondary | ICD-10-CM | POA: Diagnosis not present

## 2016-02-04 DIAGNOSIS — Z881 Allergy status to other antibiotic agents status: Secondary | ICD-10-CM | POA: Diagnosis not present

## 2016-02-04 DIAGNOSIS — Z888 Allergy status to other drugs, medicaments and biological substances status: Secondary | ICD-10-CM | POA: Diagnosis not present

## 2016-02-04 DIAGNOSIS — I959 Hypotension, unspecified: Secondary | ICD-10-CM | POA: Diagnosis present

## 2016-02-04 DIAGNOSIS — K5901 Slow transit constipation: Secondary | ICD-10-CM | POA: Diagnosis present

## 2016-02-04 DIAGNOSIS — H5462 Unqualified visual loss, left eye, normal vision right eye: Secondary | ICD-10-CM | POA: Diagnosis present

## 2016-02-04 DIAGNOSIS — I5043 Acute on chronic combined systolic (congestive) and diastolic (congestive) heart failure: Secondary | ICD-10-CM | POA: Diagnosis present

## 2016-02-04 DIAGNOSIS — Z7901 Long term (current) use of anticoagulants: Secondary | ICD-10-CM | POA: Diagnosis not present

## 2016-02-04 DIAGNOSIS — J9601 Acute respiratory failure with hypoxia: Secondary | ICD-10-CM | POA: Diagnosis present

## 2016-02-04 DIAGNOSIS — R609 Edema, unspecified: Secondary | ICD-10-CM | POA: Diagnosis not present

## 2016-02-04 DIAGNOSIS — I340 Nonrheumatic mitral (valve) insufficiency: Secondary | ICD-10-CM | POA: Diagnosis not present

## 2016-02-04 DIAGNOSIS — I509 Heart failure, unspecified: Secondary | ICD-10-CM | POA: Diagnosis not present

## 2016-02-04 DIAGNOSIS — Z880 Allergy status to penicillin: Secondary | ICD-10-CM | POA: Diagnosis not present

## 2016-02-04 DIAGNOSIS — I480 Paroxysmal atrial fibrillation: Secondary | ICD-10-CM | POA: Diagnosis present

## 2016-02-04 DIAGNOSIS — Z8546 Personal history of malignant neoplasm of prostate: Secondary | ICD-10-CM | POA: Diagnosis not present

## 2016-02-04 DIAGNOSIS — I10 Essential (primary) hypertension: Secondary | ICD-10-CM | POA: Diagnosis not present

## 2016-02-04 DIAGNOSIS — G4737 Central sleep apnea in conditions classified elsewhere: Secondary | ICD-10-CM | POA: Diagnosis present

## 2016-02-04 DIAGNOSIS — Z66 Do not resuscitate: Secondary | ICD-10-CM | POA: Diagnosis present

## 2016-02-04 DIAGNOSIS — R0602 Shortness of breath: Secondary | ICD-10-CM | POA: Diagnosis not present

## 2016-02-04 DIAGNOSIS — I350 Nonrheumatic aortic (valve) stenosis: Secondary | ICD-10-CM

## 2016-02-04 DIAGNOSIS — M6281 Muscle weakness (generalized): Secondary | ICD-10-CM | POA: Diagnosis present

## 2016-02-04 DIAGNOSIS — I080 Rheumatic disorders of both mitral and aortic valves: Secondary | ICD-10-CM | POA: Diagnosis present

## 2016-02-04 DIAGNOSIS — I110 Hypertensive heart disease with heart failure: Secondary | ICD-10-CM | POA: Diagnosis present

## 2016-02-04 DIAGNOSIS — Z515 Encounter for palliative care: Secondary | ICD-10-CM | POA: Diagnosis not present

## 2016-02-04 DIAGNOSIS — R0601 Orthopnea: Secondary | ICD-10-CM | POA: Diagnosis not present

## 2016-02-04 DIAGNOSIS — I2510 Atherosclerotic heart disease of native coronary artery without angina pectoris: Secondary | ICD-10-CM | POA: Diagnosis present

## 2016-02-04 DIAGNOSIS — Z8551 Personal history of malignant neoplasm of bladder: Secondary | ICD-10-CM | POA: Diagnosis not present

## 2016-02-04 LAB — HEPATIC FUNCTION PANEL
ALK PHOS: 74 U/L (ref 38–126)
ALT: 20 U/L (ref 17–63)
AST: 25 U/L (ref 15–41)
Albumin: 3 g/dL — ABNORMAL LOW (ref 3.5–5.0)
BILIRUBIN INDIRECT: 1.2 mg/dL — AB (ref 0.3–0.9)
BILIRUBIN TOTAL: 1.6 mg/dL — AB (ref 0.3–1.2)
Bilirubin, Direct: 0.4 mg/dL (ref 0.1–0.5)
Total Protein: 5.8 g/dL — ABNORMAL LOW (ref 6.5–8.1)

## 2016-02-04 LAB — TROPONIN I
Troponin I: 0.04 ng/mL (ref <0.03)
Troponin I: 0.04 ng/mL (ref <0.03)
Troponin I: 0.04 ng/mL (ref <0.03)

## 2016-02-04 LAB — D-DIMER, QUANTITATIVE: D-Dimer, Quant: <0.27 ug/mL-FEU (ref 0.00–0.50)

## 2016-02-04 LAB — BRAIN NATRIURETIC PEPTIDE: B Natriuretic Peptide: 3638.7 pg/mL — ABNORMAL HIGH (ref 0.0–100.0)

## 2016-02-04 MED ORDER — FUROSEMIDE 10 MG/ML IJ SOLN
20.0000 mg | Freq: Two times a day (BID) | INTRAMUSCULAR | Status: DC
  Administered 2016-02-04 (×3): 20 mg via INTRAVENOUS
  Filled 2016-02-04 (×4): qty 2

## 2016-02-04 MED ORDER — ONDANSETRON HCL 4 MG/2ML IJ SOLN: 4.0000 mg | Freq: Four times a day (QID) | INTRAMUSCULAR | Status: DC | PRN

## 2016-02-04 MED ORDER — DABIGATRAN ETEXILATE MESYLATE 150 MG PO CAPS
150.0000 mg | ORAL_CAPSULE | Freq: Two times a day (BID) | ORAL | Status: DC
  Administered 2016-02-04 – 2016-02-08 (×9): 150 mg via ORAL
  Filled 2016-02-04 (×9): qty 1

## 2016-02-04 MED ORDER — LISINOPRIL 2.5 MG PO TABS
2.5000 mg | ORAL_TABLET | Freq: Every day | ORAL | Status: DC
  Administered 2016-02-04: 2.5 mg via ORAL
  Filled 2016-02-04: qty 1

## 2016-02-04 MED ORDER — LORATADINE 10 MG PO TABS: 10.0000 mg | ORAL_TABLET | Freq: Every day | ORAL | Status: DC | PRN

## 2016-02-04 MED ORDER — POTASSIUM CHLORIDE CRYS ER 20 MEQ PO TBCR
20.0000 meq | EXTENDED_RELEASE_TABLET | Freq: Every day | ORAL | Status: DC
  Administered 2016-02-04 – 2016-02-08 (×5): 20 meq via ORAL
  Filled 2016-02-04 (×6): qty 1

## 2016-02-04 MED ORDER — ALPRAZOLAM 0.25 MG PO TABS
0.2500 mg | ORAL_TABLET | Freq: Two times a day (BID) | ORAL | Status: DC | PRN
  Administered 2016-02-04 – 2016-02-07 (×4): 0.25 mg via ORAL
  Filled 2016-02-04 (×4): qty 1

## 2016-02-04 MED ORDER — TRIAMCINOLONE ACETONIDE 55 MCG/ACT NA AERO: 1.0000 | INHALATION_SPRAY | Freq: Every day | NASAL | Status: DC | PRN

## 2016-02-04 MED ORDER — ACETAMINOPHEN 325 MG PO TABS: 650.0000 mg | ORAL_TABLET | ORAL | Status: DC | PRN

## 2016-02-04 MED ORDER — PANTOPRAZOLE SODIUM 40 MG PO TBEC
40.0000 mg | DELAYED_RELEASE_TABLET | Freq: Every day | ORAL | Status: DC
  Administered 2016-02-04 – 2016-02-08 (×5): 40 mg via ORAL
  Filled 2016-02-04 (×5): qty 1

## 2016-02-04 MED ORDER — SODIUM CHLORIDE 0.9% FLUSH
3.0000 mL | Freq: Two times a day (BID) | INTRAVENOUS | Status: DC
  Administered 2016-02-04 – 2016-02-05 (×4): 3 mL via INTRAVENOUS

## 2016-02-04 MED ORDER — SODIUM CHLORIDE 0.9% FLUSH: 3.0000 mL | INTRAVENOUS | Status: DC | PRN

## 2016-02-04 MED ORDER — SODIUM CHLORIDE 0.9 % IV SOLN: 250.0000 mL | INTRAVENOUS | Status: DC | PRN

## 2016-02-04 NOTE — Progress Notes (Signed)
Patient troponin level 0.04. MD notified.

## 2016-02-04 NOTE — ED Notes (Signed)
Report attempted. RN in [place] room.

## 2016-02-04 NOTE — H&P (Signed)
Aaron Lucas [id number] DOB: October 17, 1928 DOA: 02/03/2016     PCP: Geoffery Lyons, MD   Outpatient Specialists: [facility] Patient coming from:   home Lives  With family    Chief Complaint: Shortness of breath  HPI: Aaron Lucas is a 82 y.o. male with medical history significant of D-CHF, HTN, PAF, MV regurg &AS, prostate CA    Presented with intermittent chest discomfort today lasting for about 5 min. Patient has been  severely short of breath and gasping for air usually with a walker or any minimal activity.  Denies any chest pain. He's been having chronic lower extremity edema. Has x of cellulitis he finished antibiotics and it has been better.  He has home health nurse who comes to wrap his legs. Patient often has to sleep in his chair in order to be more comfortable Dry weight supposed to be 172 pounds as per cardiology notes from yesterday.  In September he was seen by cardiology his Lasix was increased to 40 mg twice a day transiently help helps somewhat. Patient was seen in emergency department 3 days ago was evaluated for acute on chronic diastolic heart failure was given IV Lasix and discharged to home he continues to have symptoms of PND patient was seen a can and obvious yesterday by cardiology he reports being compliant diet and medication. His BNP has been elevated when his weight comes down to 172 pounds he feels better Regarding pertinent Chronic problems: Patient has long-standing history of diastolic heart failure he has been followed by cardiology Patient is known history of paroxysmal atrial fibrillation on Pradaxa  IN ER:  Temp (24hrs), Avg:97.7 F (36.5 C), Min:97.7 F (36.5 C), Max:97.7 F (36.5 C)      86% on RA and 100% on 2 L HR 87 BP 90/60  Tr 0.00 Na 135 K 3.5 Cr 1.07  WBC 5.4 Hg 11.4 X-ray showing CHF and increasing small pleural effusions Following Medications were ordered in ER: Medications - No data to  display     Hospitalist was called for admission for chest dyscomfort intermittent palpitations  Review of Systems:    Pertinent positives include: shortness of breath at rest. palpitations.  Constitutional:  No weight loss, night sweats, Fevers, chills, fatigue, weight loss  HEENT:  No headaches, Difficulty swallowing,Tooth/dental problems,Sore throat,  No sneezing, itching, ear ache, nasal congestion, post nasal drip,  Cardio-vascular:  No chest pain, Orthopnea, PND, anasarca, dizziness, no Bilateral lower extremity swelling  GI:  No heartburn, indigestion, abdominal pain, nausea, vomiting, diarrhea, change in bowel habits, loss of appetite, melena, blood in stool, hematemesis Resp:  no No dyspnea on exertion, No excess mucus, no productive cough, No non-productive cough, No coughing up of blood.No change in color of mucus.No wheezing. Skin:  no rash or lesions. No jaundice GU:  no dysuria, change in color of urine, no urgency or frequency. No straining to urinate.  No flank pain.  Musculoskeletal:  No joint pain or no joint swelling. No decreased range of motion. No back pain.  Psych:  No change in mood or affect. No depression or anxiety. No memory loss.  Neuro: no localizing neurological complaints, no tingling, no weakness, no double vision, no gait abnormality, no slurred speech, no confusion  As per HPI otherwise 10 point review of systems negative.   Past Medical History: Past Medical History:  Diagnosis Date  . Cataracts, bilateral   . Chronic diastolic CHF (congestive heart failure) ([place]) 11/25/2015  .  Detached retina Twice   As a result he is lost vision in the left eye.  . Diverticulosis 2006  . Hx of colonic polyps 2006,  2011  . Hypertension   . Paroxysmal atrial fibrillation (HCC)    On Pradaxa.   . Prostate cancer ([place]) 2004   seed implant  . 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 Valvular heart disease    MV regurge and Ao  stenosis  . Vertigo    Past Surgical History:  Procedure Laterality Date  . CARDIAC CATHETERIZATION N/A 04/19/2015   Procedure: Right/Left Heart Cath and Coronary Angiography;  Surgeon: Burnell Blanks, MD;  Location: [facility];  Service: Cardiovascular;  Laterality: N/A;  . CATARACT EXTRACTION    . COLONOSCOPY  2006 and 11/2009   tubular adenomas on both studies. int rrhoids, diverticulosis.   . COLONOSCOPY N/A 11/15/2014   Procedure: COLONOSCOPY;  Surgeon: Milus Banister, MD;  Location: [facility];  Service: Endoscopy;  Laterality: N/A;  . 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 Focal urothelial atypia in 2006.  Marland Kitchen ESOPHAGOGASTRODUODENOSCOPY N/A 11/15/2014   Procedure: ESOPHAGOGASTRODUODENOSCOPY (EGD);  Surgeon: Milus Banister, MD;  Location: [facility];  Service: Endoscopy;  Laterality: N/A;  . INGUINAL HERNIA REPAIR Bilateral 11/28/2015   Procedure: BILATERAL INGUINAL HERNIA REPAIR;  Surgeon: Erroll Luna, MD;  Location: [facility];  Service: General;  Laterality: Bilateral;  . INSERTION OF MESH Bilateral 11/28/2015   Procedure: INSERTION OF MESH;  Surgeon: Erroll Luna, MD;  Location: [facility];  Service: General;  Laterality: Bilateral;  . PLEURAL EFFUSION DRAINAGE  2010  . RADIOACTIVE SEED IMPLANT  2004   For treatment of prostate cancer  . SQUAMOUS CELL CARCINOMA EXCISION  2002, 2003, 2006.  . TEE WITHOUT CARDIOVERSION N/A 05/14/2015   Procedure: TRANSESOPHAGEAL ECHOCARDIOGRAM (TEE);  Surgeon: Larey Dresser, MD;  Location: [facility];  Service: Cardiovascular;  Laterality: N/A;  . THORACENTESIS    . TONSILLECTOMY       Social History:  Ambulatory  walker      reports that he has never smoked. He has never used smokeless tobacco. He reports that he drinks alcohol. He reports that he does not use drugs.  Allergies:   Allergies  Allergen Reactions  . Ambien [Zolpidem Tartrate] Other (See  Comments)    Per patient and wife "hallucinations and sleep walking" (tried to climb out of the windows in the middle of the night)  . Diamox [Acetazolamide] Other (See Comments)    UNSPECIFIED   . Other Other (See Comments)    Nuts, pecans only--unknown  . Penicillins Swelling and Rash    SWELLING REACTION UNSPECIFIED  Has patient had a PCN reaction causing immediate rash, facial/tongue/throat swelling, SOB or lightheadedness with hypotension: unknown Has patient had a PCN reaction causing severe rash involving mucus membranes or skin necrosis: No Has patient had a PCN reaction that required hospitalization No Has patient had a PCN reaction occurring within the last 10 years: No    . Ciprofloxacin Rash  . Doxycycline Rash  . Erythromycin Rash  . Neptazane [Methazolamide] Other (See Comments)    UNSPECIFIED        Family History:   Family History  Problem Relation Age of Onset  . Pulmonary embolism Father 71    after surgery  . Cancer Mother   . Other       there is  no known coronary artery disease   . Heart attack Neg Hx     Medications: Prior to Admission medications   Medication Sig Start Date End Date Taking? Authorizing Provider  ALPRAZolam (XANAX) 0.25 MG tablet Take 1 tablet (0.25 mg total) by mouth at bedtime as needed for anxiety. 01/22/16  Yes Carmen Dohmeier, MD  clindamycin (CLEOCIN T) 1 % lotion Apply 1 application topically as needed (RASH).    Yes Historical Provider, MD  dabigatran (PRADAXA) 150 MG CAPS capsule Take 1 capsule (150 mg total) by mouth every 12 (twelve) hours. 11/14/15  Yes Minus Breeding, MD  furosemide (LASIX) 40 MG tablet Take 1 tablet (40 mg total) by mouth daily. 01/08/16  Yes Rhonda G Barrett, PA-C  lisinopril (PRINIVIL,ZESTRIL) 2.5 MG tablet Take 1 tablet (2.5 mg total) by mouth daily. 12/01/15  Yes Orson Eva, MD  loratadine (CLARITIN) 10 MG tablet Take 10 mg by mouth daily as needed for allergies.    Yes Historical Provider, MD   metroNIDAZOLE (METROCREAM) 0.75 % cream Apply 1 application topically 2 (two) times daily as needed (for skin lesions).    Yes Historical Provider, MD  Multiple Vitamins-Minerals (MULTIVITAMIN WITH MINERALS) tablet Take 1 tablet by mouth daily.   Yes Historical Provider, MD  Omega-3 Fatty Acids (FISH OIL PO) Take 1 capsule by mouth daily.    Yes Historical Provider, MD  omeprazole (PRILOSEC) 40 MG capsule Take 40 mg by mouth daily. 12/18/14  Yes Historical Provider, MD  triamcinolone (NASACORT ALLERGY 24HR) 55 MCG/ACT AERO nasal inhaler Place 1 spray into both nostrils daily as needed (for congestion).    Yes Historical Provider, MD  vitamin C (ASCORBIC ACID) 500 MG tablet Take 500 mg by mouth daily.   Yes Historical Provider, MD    Physical Exam: Patient Vitals for the past 24 hrs:  BP Temp Temp src Pulse Resp SpO2  02/03/16 2335 96/83 - - - - -  02/03/16 2215 90/60 97.7 F (36.5 C) Oral 87 20 100 %  02/03/16 2212 - - - - - 100 %    1. General:  in No Acute distress 2. Psychological: Alert and   Oriented 3. Head/ENT:   Moist  Mucous Membranes                          Head Non traumatic, neck supple                            Poor Dentition 4. SKIN:   decreased Skin turgor,  Skin clean Dry and intact no rash 5. Heart: Regular rate and rhythm Systolic loud Murmur, Rub or gallop 6. Lungs:   no wheezes mild occasional crackles   7. Abdomen: Soft,  non-tender, Non distended 8. Lower extremities: no clubbing, cyanosis, right lower extremity wrapped left lower extremity with trace edema 9. Neurologically Grossly intact, moving all 4 extremities equally 10. MSK: Normal range of motion   body mass index is unknown because there is no height or weight on file.  Labs on Admission:   Labs on Admission: I have personally reviewed following labs and imaging studies  CBC:  Recent Labs Lab 02/01/16 2227 02/03/16 2235  WBC 5.7 5.4  HGB 11.1* 11.4*  HCT 35.2* 36.5*  MCV 84.4 85.5  PLT  141* [id number]*   Basic Metabolic Panel:  Recent Labs Lab 02/01/16 2227 02/03/16 2235  NA 136 135  K 3.8 3.5  CL 100* 98*  CO2 27 29  GLUCOSE 112* 110*  BUN 24* 23*  CREATININE 1.15 1.07  CALCIUM 8.6* 8.8*  MG 2.2  --    GFR: Estimated Creatinine Clearance: 52.1 mL/min (by C-G formula based on SCr of 1.07 mg/dL). Liver Function Tests: No results for input(s): AST, ALT, ALKPHOS, BILITOT, PROT, ALBUMIN in the last 168 hours. No results for input(s): LIPASE, AMYLASE in the last 168 hours. No results for input(s): AMMONIA in the last 168 hours. Coagulation Profile: No results for input(s): INR, PROTIME in the last 168 hours. Cardiac Enzymes: No results for input(s): CKTOTAL, CKMB, CKMBINDEX, TROPONINI in the last 168 hours. BNP (last 3 results) No results for input(s): PROBNP in the last 8760 hours. HbA1C: No results for input(s): HGBA1C in the last 72 hours. CBG: No results for input(s): GLUCAP in the last 168 hours. Lipid Profile: No results for input(s): CHOL, HDL, LDLCALC, TRIG, CHOLHDL, LDLDIRECT in the last 72 hours. Thyroid Function Tests: No results for input(s): TSH, T4TOTAL, FREET4, T3FREE, THYROIDAB in the last 72 hours. Anemia Panel: No results for input(s): VITAMINB12, FOLATE, FERRITIN, TIBC, IRON, RETICCTPCT in the last 72 hours.  Sepsis Labs: @LABRCNTIP (procalcitonin:4,lacticidven:4) )No results found for this or any previous visit (from the past 240 hour(s)).     UA  not ordered  No results found for: HGBA1C  Estimated Creatinine Clearance: 52.1 mL/min (by C-G formula based on SCr of 1.07 mg/dL).  BNP (last 3 results) No results for input(s): PROBNP in the last 8760 hours.   ECG REPORT  Independently reviewed Rate: 86  Rhythm: Sinus rhythm ST&T Change: No acute ischemic changes  QTC 449  There were no vitals filed for this visit.   Cultures:    Component Value Date/Time   SDES URINE, RANDOM 11/30/2015 1706   SPECREQUEST NONE 11/30/2015 1706    CULT NO GROWTH 11/30/2015 1706   REPTSTATUS 12/01/2015 FINAL 11/30/2015 1706     Radiological Exams on Admission: Dg Chest 2 View  Result Date: 02/03/2016 CLINICAL DATA:  Shortness of breath tonight. History of hypertension, recurrent bladder and prostate cancer, and heart disease. EXAM: CHEST  2 VIEW COMPARISON:  Chest radiograph February 01, 2016 FINDINGS: Cardiac silhouette is mildly enlarged and unchanged. Calcified aortic knob. Similar LEFT and increasing RIGHT small pleural effusions with bibasilar atelectasis. Strandy densities LEFT lung base. Pulmonary vascular congestion and mild interstitial prominence. No pneumothorax. Old LEFT posterolateral rib fractures. IMPRESSION: CHF with increasing small pleural effusions. LEFT lung base atelectasis versus confluent edema, less likely pneumonia. Electronically Signed   By: Elon Alas M.D.   On: 02/03/2016 23:19    Chart has been reviewed    Assessment/Plan   82 y.o. male with medical history significant of D-CHF, HTN, PAF, MV regurg &AS, prostate CABeing admitted for intermittent chest discomfort and palpitations worrisome for acute on chronic CHF exacerbation   Present on Admission: . Acute on chronic combined systolic and diastolic CHF, NYHA class 2 (HCC) - fluid status difficult to tell patient has only trace edema but there is some evidence of CHF on chest x-ray with evidence of hypoxia. Blood pressure is soft. Avoid over aggressive diuresis. Follow creatinine carefully. Appreciate cardiology consult. Repeat echogram to evaluate for any changes since worsening shortness of breath since August when was her last echogram. Chest discomfort and palpitations patient will likely benefit from outpatient set up for monitoring, will admit and cycle cardiac enzymes . Central sleep apnea due to medical condition - sleep study has been  done recently results currently pending we'll need to follow up as an outpatient . Hypertension - currently  hypotensive will put holding parameters on lisinopril . Paroxysmal atrial fibrillation (HCC) CHA2DS2VASc=4 (age x 2, CHF, HTN) on Pradaxa continue currently appears to be rate controlled   Other plan as per orders.  DVT prophylaxis:  Pradaxa  Code Status:    DNR/DNI   as per patient   Family Communication:   Family  at  Bedside  plan of care was discussed with  Wife,    Disposition Plan:    To home once workup is complete and patient is stable                         Would benefit from PT/OT at home                                            Consults called: emailed cardiology    Admission status:   Inpatient    Level of care   tele         I have spent a total of 56 min on this admission  Nevelyn Mellott 02/04/2016, 1:07 AM    [facility]  Pager [phone]   after 2 AM please page floor coverage PA If 7AM-7PM, please contact the day team taking care of the patient  [email or web address]  Password [email or web address]

## 2016-02-04 NOTE — Progress Notes (Signed)
Patient arrived in the unit accompanied by NT via stretcher. Orientation given to the patient . Patient verbalizes understanding.

## 2016-02-04 NOTE — Consult Note (Signed)
CARDIOLOGY CONSULT NOTE   Patient ID: Aaron Lucas MRN: [id number] DOB/AGE: [birth year] 82 y.o.  Admit date: 02/03/2016  Primary Physician   [name],[name], MD Primary Cardiologist   Dr Aaron Lucas Reason for Consultation   Chest pain, CHF Requesting MD: Dr Cruzita Lederer  [id number] Aaron Lucas is a 82 y.o. year old male with a history of D-CHF, HTN, PAF, MV regurg &AS, prostate CA, DNR  10/21 ER visit for acute onset SOB and given Lasix 40 mg IV x 1, diuresed and improved. 10/23 Pt seen in the office and weight was 172 lbs on home scales, lower than previous, very weak but still breathing better after diuresis.   Very late 10/23, pt developed intermitted chest pain, episodes lasting about 5 min each. Also had another episode of acute SOB. Came to ER and was admitted.  Pt wife not present. Pt currently says breathing better on O2 and after diuresis, but currently does not remember having any chest pain. He denies any palpitations.   He has SOB with any activity and does very little as a result. He has diuresed only 265 cc since admission. Lasix is 20 mg IV bid since SBP is 90s. He seems to do a little better on O2, but still gets SOB with minimal activity.    Past Medical History:  Diagnosis Date  . Cataracts, bilateral   . Chronic diastolic CHF (congestive heart failure) ([place]) 11/25/2015  . Detached retina Twice   As a result he is lost vision in the left eye.  . Diverticulosis 2006  . Hx of colonic polyps 2006,  2011  . Hypertension   . Paroxysmal atrial fibrillation (HCC)    On Pradaxa.   . Prostate cancer ([place]) 2004   seed implant  . 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 Valvular heart disease    MV regurge and Ao stenosis  . Vertigo      Past Surgical History:  Procedure Laterality Date  . CARDIAC CATHETERIZATION N/A 04/19/2015   Procedure: Right/Left Heart Cath and Coronary Angiography;  Surgeon: Burnell Blanks, MD;  Location: [facility];  Service: Cardiovascular;  Laterality: N/A;  . CATARACT EXTRACTION    . COLONOSCOPY  2006 and 11/2009   tubular adenomas on both studies. int rrhoids, diverticulosis.   . COLONOSCOPY N/A 11/15/2014   Procedure: COLONOSCOPY;  Surgeon: Milus Banister, MD;  Location: [facility];  Service: Endoscopy;  Laterality: N/A;  . 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 Focal urothelial atypia in 2006.  Marland Kitchen ESOPHAGOGASTRODUODENOSCOPY N/A 11/15/2014   Procedure: ESOPHAGOGASTRODUODENOSCOPY (EGD);  Surgeon: Milus Banister, MD;  Location: [facility];  Service: Endoscopy;  Laterality: N/A;  . INGUINAL HERNIA REPAIR Bilateral 11/28/2015   Procedure: BILATERAL INGUINAL HERNIA REPAIR;  Surgeon: Erroll Luna, MD;  Location: [facility];  Service: General;  Laterality: Bilateral;  . INSERTION OF MESH Bilateral 11/28/2015   Procedure: INSERTION OF MESH;  Surgeon: Erroll Luna, MD;  Location: [facility];  Service: General;  Laterality: Bilateral;  . PLEURAL EFFUSION DRAINAGE  2010  . RADIOACTIVE SEED IMPLANT  2004   For treatment of prostate cancer  . SQUAMOUS CELL CARCINOMA EXCISION  2002, 2003, 2006.  . TEE WITHOUT CARDIOVERSION N/A 05/14/2015   Procedure: TRANSESOPHAGEAL ECHOCARDIOGRAM (TEE);  Surgeon: Larey Dresser, MD;  Location: [facility];  Service: Cardiovascular;  Laterality: N/A;  . THORACENTESIS    .  TONSILLECTOMY      Allergies  Allergen Reactions  . Ambien [Zolpidem Tartrate] Other (See Comments)    Per patient and wife "hallucinations and sleep walking" (tried to climb out of the windows in the middle of the night)  . Diamox [Acetazolamide] Other (See Comments)    UNSPECIFIED   . Other Other (See Comments)    Nuts, pecans only--unknown  . Penicillins Swelling and Rash    SWELLING REACTION UNSPECIFIED  Has patient had a PCN reaction causing immediate rash, facial/tongue/throat swelling, SOB or lightheadedness  with hypotension: unknown Has patient had a PCN reaction causing severe rash involving mucus membranes or skin necrosis: No Has patient had a PCN reaction that required hospitalization No Has patient had a PCN reaction occurring within the last 10 years: No    . Ciprofloxacin Rash  . Doxycycline Rash  . Erythromycin Rash  . Neptazane [Methazolamide] Other (See Comments)    UNSPECIFIED     I have reviewed the patient's current medications . dabigatran  150 mg Oral Q12H  . furosemide  20 mg Intravenous BID  . lisinopril  2.5 mg Oral Daily  . pantoprazole  40 mg Oral Daily  . potassium chloride  20 mEq Oral Daily  . sodium chloride flush  3 mL Intravenous Q12H     sodium chloride, acetaminophen, ALPRAZolam, loratadine, ondansetron (ZOFRAN) IV, sodium chloride flush, triamcinolone  Prior to Admission medications   Medication Sig Start Date End Date Taking? Authorizing Provider  ALPRAZolam (XANAX) 0.25 MG tablet Take 1 tablet (0.25 mg total) by mouth at bedtime as needed for anxiety. 01/22/16  Yes Carmen Dohmeier, MD  clindamycin (CLEOCIN T) 1 % lotion Apply 1 application topically as needed (RASH).    Yes Historical Provider, MD  dabigatran (PRADAXA) 150 MG CAPS capsule Take 1 capsule (150 mg total) by mouth every 12 (twelve) hours. 11/14/15  Yes Minus Breeding, MD  furosemide (LASIX) 40 MG tablet Take 1 tablet (40 mg total) by mouth daily. 01/08/16  Yes Rhonda G Barrett, PA-Aaron  lisinopril (PRINIVIL,ZESTRIL) 2.5 MG tablet Take 1 tablet (2.5 mg total) by mouth daily. 12/01/15  Yes Orson Eva, MD  loratadine (CLARITIN) 10 MG tablet Take 10 mg by mouth daily as needed for allergies.    Yes Historical Provider, MD  metroNIDAZOLE (METROCREAM) 0.75 % cream Apply 1 application topically 2 (two) times daily as needed (for skin lesions).    Yes Historical Provider, MD  Multiple Vitamins-Minerals (MULTIVITAMIN WITH MINERALS) tablet Take 1 tablet by mouth daily.   Yes Historical Provider, MD  Omega-3  Fatty Acids (FISH OIL PO) Take 1 capsule by mouth daily.    Yes Historical Provider, MD  omeprazole (PRILOSEC) 40 MG capsule Take 40 mg by mouth daily. 12/18/14  Yes Historical Provider, MD  triamcinolone (NASACORT ALLERGY 24HR) 55 MCG/ACT AERO nasal inhaler Place 1 spray into both nostrils daily as needed (for congestion).    Yes Historical Provider, MD  vitamin Aaron (ASCORBIC ACID) 500 MG tablet Take 500 mg by mouth daily.   Yes Historical Provider, MD     Social History   Social History  . Marital status: Married    Spouse name: N/A  . Number of children: 1  . Years of education: N/A   Occupational History  . retired-Magistrate Retired   Social History Main Topics  . Smoking status: Never Smoker  . Smokeless tobacco: Never Used  . Alcohol use Yes     Comment: occassionally  . Drug use: No  .  Sexual activity: Not on file   Other Topics Concern  . Not on file   Social History Narrative   [employer]   Was exercising 5-6 days a week    Blood type O Positive   Lowe sodium diet    Family Status  Relation Status  . Father Deceased  . Mother Deceased  . Maternal Grandmother Deceased  . Maternal Grandfather Deceased  . Paternal Grandmother Deceased  . Paternal Grandfather Deceased  .    Marland Kitchen Neg Hx    Family History  Problem Relation Age of Onset  . Pulmonary embolism Father 46    after surgery  . Cancer Mother   . Other       there is no known coronary artery disease   . Heart attack Neg Hx      ROS:  Full 14 point review of systems complete and found to be negative unless listed above.  Physical Exam: Blood pressure (!) 93/59, pulse 79, temperature 97.7 F (36.5 Aaron), temperature source Oral, resp. rate 18, height 5\' 10"  (1.778 m), weight 172 lb 9.6 oz (78.3 kg), SpO2 100 %.  General: Well developed, well nourished, male in no acute distress Head: Eyes PERRLA, No xanthomas.   Normocephalic and atraumatic, oropharynx without edema or exudate. Dentition: poor Lungs:  decreased BS bases w/ fine rales Heart: HRRR S1 S2, no rub/gallop, 2-3/6 murmur. pulses are 2+ both upper extrem.  Difficult to assess in lower extrem due to RLE wrapped and LLE w/ compression stocking on Neck: No carotid bruits. No lymphadenopathy.  JVD not elevated Abdomen: Bowel sounds present, abdomen soft and non-tender without masses or hernias noted. Msk:  No spine or cva tenderness. No weakness, no joint deformities or effusions. Extremities: No clubbing or cyanosis. Trace LE edema.  Neuro: Alert and oriented X 3. No focal deficits noted. Psych:  Good affect, responds appropriately Skin: No rashes or lesions noted.  Labs:   Lab Results  Component Value Date   WBC 5.4 02/03/2016   HGB 11.4 (L) 02/03/2016   HCT 36.5 (L) 02/03/2016   MCV 85.5 02/03/2016   PLT 140 (L) 02/03/2016     Recent Labs Lab 02/03/16 2235 02/04/16 0201  NA 135  --   K 3.5  --   CL 98*  --   CO2 29  --   BUN 23*  --   CREATININE 1.07  --   CALCIUM 8.8*  --   PROT  --  5.8*  BILITOT  --  1.6*  ALKPHOS  --  74  ALT  --  20  AST  --  25  GLUCOSE 110*  --   ALBUMIN  --  3.0*   Magnesium  Date Value Ref Range Status  02/01/2016 2.2 1.7 - 2.4 mg/dL Final    Recent Labs  02/04/16 0201 02/04/16 0730  TROPONINI 0.04* 0.04*    Recent Labs  02/02/16 0112 02/03/16 2241  TROPIPOC 0.01 0.00   B Natriuretic Peptide  Date/Time Value Ref Range Status  02/03/2016 10:35 PM 3,638.7 (H) 0.0 - 100.0 pg/mL Final  02/01/2016 10:27 PM 2,743.2 (H) 0.0 - 100.0 pg/mL Final   Lab Results  Component Value Date   DDIMER <0.27 02/04/2016   Echo: 10/24 echo ordered 11/26/2015 - Left ventricle: The cavity size was normal. Systolic function was   mildly reduced. The estimated ejection fraction was in the range   of 45% to 50%. Wall motion was normal; there were no regional   wall  motion abnormalities. Doppler parameters are consistent with   a reversible restrictive pattern, indicative of decreased  left   ventricular diastolic compliance and/or increased left atrial   pressure (grade 3 diastolic dysfunction). - Aortic valve: Trileaflet; severely thickened, severely calcified   leaflets. Valve mobility was restricted. There was severe   stenosis. There was trivial regurgitation. Valve area (VTI): 0.5   cm^2. Valve area (Vmax): 0.43 cm^2. Valve area (Vmean): 0.41 cm^2. - Mitral valve: Moderately calcified annulus. Mildly thickened   leaflets . Moderate prolapse, involving the posterior leaflet   with possible partial flail leaflet. There was severe   regurgitation directed anteriorly. - Left atrium: The atrium was severely dilated. Volume/bsa, ES,   (1-plane Simpson&'s, A2C): 68.1 ml/m^2. - Right atrium: The atrium was mildly dilated. - Tricuspid valve: There was mild regurgitation. - Pulmonary arteries: Systolic pressure was moderately increased.   PA peak pressure: 54 mm Hg (S). Impressions: - Aortic stenosis has progressed and EF is reduced (prior 55%)  ECG:  10/23 SR, 1st degree AV block and PVCs  Radiology:  Dg Chest 2 View Result Date: 02/03/2016 CLINICAL DATA:  Shortness of breath tonight. History of hypertension, recurrent bladder and prostate cancer, and heart disease. EXAM: CHEST  2 VIEW COMPARISON:  Chest radiograph February 01, 2016 FINDINGS: Cardiac silhouette is mildly enlarged and unchanged. Calcified aortic knob. Similar LEFT and increasing RIGHT small pleural effusions with bibasilar atelectasis. Strandy densities LEFT lung base. Pulmonary vascular congestion and mild interstitial prominence. No pneumothorax. Old LEFT posterolateral rib fractures. IMPRESSION: CHF with increasing small pleural effusions. LEFT lung base atelectasis versus confluent edema, less likely pneumonia. Electronically Signed   By: Elon Alas M.D.   On: 02/03/2016 23:19    ASSESSMENT AND PLAN:   The patient was seen today by Dr Martinique, the patient evaluated and the data reviewed.  1.  Acute on chronic combined systolic and diastolic CHF, NYHA class 4 (HCC) - pt weight is at target, yet pt still very SOB  - diuresis is helping him, continue, not sure BP will tolerate a dose increase  2.   Severe aortic valve stenosis with severe MR - previously not a TAVR candidate due to MR - the valve issues may be the limiting problem - repeat echo ordered, MD advise if this is needed. - If valves are the limiting factor, diuresis may not help much. - he is a DNR, may need Palliative Care consult  3.   Paroxysmal atrial fibrillation (HCC) - maintaining SR  - anticoag w/ Pradaxa  4. Chest pain - minimal elevation in troponin more Aaron/w CHF - no ongoing sx - cath 04/2015 w/ LAD 60%, D1 65%, D2 50%, OM1 20%, RCA 20%, RPDA 40% - med rx for non-obs CAD  Otherwise, per IM Active Problems:   Hypertension   Central sleep apnea due to medical condition   CHF exacerbation [facility])   SignedLenoard Aden 02/04/2016 12:33 PM Beeper [id number]  Co-Sign MD Patient seen and examined and history reviewed. Agree with above findings and plan. Mr. Finkbiner is an 82 yo WM with history of critical AS and severe MR with a flail P2 leaflet of the MV. He has done poorly since bilateral inguinal hernia repair in August. He has noted marked decrease in energy and exercise tolerance, increased edema, worsening dyspnea. In speaking with the patient and wife they thought this was more related to surgery recovery and inability to sleep. He had 4 prior ED visits since August prior to  this admission. He denies chest pain or SOB. He was seen for possible TAVR evaluation by Dr. Cyndia Bent and Dr. Angelena Form but felt to be a poor candidate due to age and severe MR.  On exam BP is low 92 systolic NSR Positive JVD to 10 cm Bibasilar rales. Harsh gr 3/6 systolic murmur RUSB with blowing systolic murmur at the apex. Right leg has an ACE wrap on it and left leg has compression hose. 2 + edema of thigh.  Labs,  Ecg, CXR reviewed. Prior Echo in August reviewed.  Impression: Mr Champeau has advanced CHF secondary to critical AS and severe MR secondary to flail posterior leaflet. He has steadily declined since hernia surgery in August. He is not a candidate for valve replacement or repair. His prognosis is very poor.  I addressed poor prognosis with patient and his wife. Explained that his condition cannot be fixed but that we would try to maintain his limited quality of life. Will treat with IV lasix. Hold lisinopril due to hypotension. I brought up the possibility of Palliative care but they may need more time to process this information.  Will follow.    Briahna Pescador Martinique, [place] 02/04/2016 1:18 PM

## 2016-02-04 NOTE — Discharge Instructions (Signed)

## 2016-02-04 NOTE — Progress Notes (Signed)
PT Cancellation Note  Patient Details Name: Aaron Lucas MRN: [id number] DOB: 11-16-1928   Cancelled Treatment:    Reason Eval/Treat Not Completed: Patient at procedure or test/unavailable. Currently talking to Chaplain. Noted Dr. Doug Sou recent discussion with wife and pt re: poor prognosis.   Spoke with RN and pt with severe dyspnea with standing EOB to use urinal earlier.  Will re-attempt evaluation 10/25 if medically appropriate.   [name] 02/04/2016, 1:52 PM  Pager [phone]

## 2016-02-04 NOTE — Progress Notes (Signed)
PROGRESS NOTE  Aaron Lucas [id number] DOB: May 24, 1928 DOA: 02/03/2016 PCP: Geoffery Lyons, MD   LOS: 0 days   Brief Narrative: 82 y.o. Male with a history of D-CHF, HTN, PAF, MV regurg &AS, prostate CA, DNR. He is compliant with medications, diet, and daily weight measurements. Lasix has been increased several times. Patient developed SOB on 10/21. His SOB got progressively worse and he noted increased heart rate with exertion 02/03/16, so he presented to the ED. He presented to the ED 3 days prior for a similar complaint. On 02/03/16 he noted being on xanax for the past week for sleep disturbance. BNP 3,638.7 and troponin I 0.0 on 02/03/16. CXR on 02/03/16 shows CHF with increasing small pleural effusions. LEFT lung base atelectasis versus confluent edema, less likely pneumonia. Patient denies fever, chills, chest pain, palpitations, cough. He does note peripheral edema.   Assessment & Plan: Active Problems:   Hypertension   Paroxysmal atrial fibrillation (HCC)   Acute on chronic combined systolic and diastolic CHF, NYHA class 2 (HCC)   Central sleep apnea due to medical condition   Aortic stenosis, moderate   CHF exacerbation (HCC)   Chest pain   Acute and chronic systolic/diastolic CHF/ CHF exacerbation - CXR on 02/03/16 shows CHF with increasing small pleural effusions. LEFT lung base atelectasis versus confluent edema, less likely pneumonia. - Continue lasix per cards - Awaiting echocardiogram  Chest pain - CXR on 02/03/16 shows CHF with increasing small pleural effusions. LEFT lung base atelectasis versus confluent edema, less likely pneumonia. - BNP 3,638.7 on 02/03/16 - On admissionTroponin 0.04, d-dimer <0.27   Hypertension - Hypotensive in ED 02/04/16; lisinopril put on hold  Paroxysmal Atrial fibrillation - patient's CHA2DS2-VASc Score for Stroke Risk is at least 2 - Continue pradaxa  Severe Aortic stenosis / Moderate MVP / severe MR - Ao valve area 0.43  on most recent echo - cards to see   DVT prophylaxis: Pradaxa Code Status: DNR Family Communication: Wife at bedside Disposition Plan: TBD  Consultants:   Cardiology   Procedures:   2D echo: pending  Antimicrobials:  None   Subjective: Patient denies fever, chills, chest pain, palpitations, cough. He does note peripheral edema.   Objective: Vitals:   02/04/16 0038 02/04/16 0118 02/04/16 0157 02/04/16 0610  BP: 99/66 107/72 91/63 99/68   Pulse: 89 92 87 87  Resp: 13 21 20    Temp:   97.6 F (36.4 C)   TempSrc:   Oral Oral  SpO2: 99% 100% 100% 98%  Weight:   78.3 kg (172 lb 9.6 oz)   Height:   5\' 10"  (1.778 m)     Intake/Output Summary (Last 24 hours) at 02/04/16 1027 Last data filed at 02/04/16 0951  Gross per 24 hour  Intake              363 ml  Output              600 ml  Net             -237 ml   Filed Weights   02/04/16 0157  Weight: 78.3 kg (172 lb 9.6 oz)    Examination: Constitutional: NAD Vitals:   02/04/16 0038 02/04/16 0118 02/04/16 0157 02/04/16 0610  BP: 99/66 107/72 91/63 99/68   Pulse: 89 92 87 87  Resp: 13 21 20    Temp:   97.6 F (36.4 C)   TempSrc:   Oral Oral  SpO2: 99% 100% 100% 98%  Weight:   78.3 kg (  172 lb 9.6 oz)   Height:   5\' 10"  (1.778 m)    Eyes: lids and conjunctivae normal ENMT: Mucous membranes are moist.  Respiratory: Rales at lung bases bilaterally, no wheezing. Normal respiratory effort. No accessory muscle use.  Cardiovascular: Regular rate and rhythm, 3/6 SEM. JVD bilaterally. 3+ lower extremity pitting edema bilaterally up to thighs Abdomen: no tenderness. Skin: no rashes, lesions, ulcers. No induration   Data Reviewed: I have personally reviewed following labs and imaging studies  CBC:  Recent Labs Lab 02/01/16 2227 02/03/16 2235  WBC 5.7 5.4  HGB 11.1* 11.4*  HCT 35.2* 36.5*  MCV 84.4 85.5  PLT 141* [id number]*   Basic Metabolic Panel:  Recent Labs Lab 02/01/16 2227 02/03/16 2235  NA 136 135  K 3.8  3.5  CL 100* 98*  CO2 27 29  GLUCOSE 112* 110*  BUN 24* 23*  CREATININE 1.15 1.07  CALCIUM 8.6* 8.8*  MG 2.2  --    GFR: Estimated Creatinine Clearance: 52.1 mL/min (by C-G formula based on SCr of 1.07 mg/dL). Liver Function Tests:  Recent Labs Lab 02/04/16 0201  AST 25  ALT 20  ALKPHOS 74  BILITOT 1.6*  PROT 5.8*  ALBUMIN 3.0*   No results for input(s): LIPASE, AMYLASE in the last 168 hours. No results for input(s): AMMONIA in the last 168 hours. Coagulation Profile: No results for input(s): INR, PROTIME in the last 168 hours. Cardiac Enzymes:  Recent Labs Lab 02/04/16 0201 02/04/16 0730  TROPONINI 0.04* 0.04*   BNP (last 3 results) No results for input(s): PROBNP in the last 8760 hours. HbA1C: No results for input(s): HGBA1C in the last 72 hours. CBG: No results for input(s): GLUCAP in the last 168 hours. Lipid Profile: No results for input(s): CHOL, HDL, LDLCALC, TRIG, CHOLHDL, LDLDIRECT in the last 72 hours. Thyroid Function Tests: No results for input(s): TSH, T4TOTAL, FREET4, T3FREE, THYROIDAB in the last 72 hours. Anemia Panel: No results for input(s): VITAMINB12, FOLATE, FERRITIN, TIBC, IRON, RETICCTPCT in the last 72 hours. Urine analysis:    Component Value Date/Time   COLORURINE YELLOW 11/30/2015 1706   APPEARANCEUR CLEAR 11/30/2015 1706   LABSPEC 1.012 11/30/2015 1706   PHURINE 6.0 11/30/2015 1706   GLUCOSEU NEGATIVE 11/30/2015 1706   HGBUR SMALL (A) 11/30/2015 1706   BILIRUBINUR NEGATIVE 11/30/2015 1706   KETONESUR NEGATIVE 11/30/2015 1706   PROTEINUR NEGATIVE 11/30/2015 1706   UROBILINOGEN 0.2 10/29/2010 1613   NITRITE NEGATIVE 11/30/2015 1706   LEUKOCYTESUR NEGATIVE 11/30/2015 1706   Sepsis Labs: Invalid input(s): PROCALCITONIN, LACTICIDVEN  No results found for this or any previous visit (from the past 240 hour(s)).    Radiology Studies: Dg Chest 2 View  Result Date: 02/03/2016 CLINICAL DATA:  Shortness of breath tonight.  History of hypertension, recurrent bladder and prostate cancer, and heart disease. EXAM: CHEST  2 VIEW COMPARISON:  Chest radiograph February 01, 2016 FINDINGS: Cardiac silhouette is mildly enlarged and unchanged. Calcified aortic knob. Similar LEFT and increasing RIGHT small pleural effusions with bibasilar atelectasis. Strandy densities LEFT lung base. Pulmonary vascular congestion and mild interstitial prominence. No pneumothorax. Old LEFT posterolateral rib fractures. IMPRESSION: CHF with increasing small pleural effusions. LEFT lung base atelectasis versus confluent edema, less likely pneumonia. Electronically Signed   By: Elon Alas M.D.   On: 02/03/2016 23:19     Scheduled Meds: . dabigatran  150 mg Oral Q12H  . furosemide  20 mg Intravenous BID  . lisinopril  2.5 mg Oral Daily  .  pantoprazole  40 mg Oral Daily  . potassium chloride  20 mEq Oral Daily  . sodium chloride flush  3 mL Intravenous Q12H    Marzetta Board, MD, PhD [facility] Pager [phone]  If 7PM-7AM, please contact night-coverage [email or web address] Password [email or web address] 02/04/2016, 10:27 AM

## 2016-02-05 ENCOUNTER — Telehealth: Payer: Self-pay

## 2016-02-05 DIAGNOSIS — I509 Heart failure, unspecified: Secondary | ICD-10-CM

## 2016-02-05 DIAGNOSIS — I10 Essential (primary) hypertension: Secondary | ICD-10-CM

## 2016-02-05 DIAGNOSIS — G4733 Obstructive sleep apnea (adult) (pediatric): Secondary | ICD-10-CM

## 2016-02-05 LAB — BASIC METABOLIC PANEL
Anion gap: 8 (ref 5–15)
BUN: 22 mg/dL — AB (ref 6–20)
CALCIUM: 8.9 mg/dL (ref 8.9–10.3)
CO2: 29 mmol/L (ref 22–32)
CREATININE: 1.04 mg/dL (ref 0.61–1.24)
Chloride: 101 mmol/L (ref 101–111)
GFR calc non Af Amer: >60 mL/min (ref >60)
Glucose, Bld: 101 mg/dL — ABNORMAL HIGH (ref 65–99)
Potassium: 3.5 mmol/L (ref 3.5–5.1)
SODIUM: 138 mmol/L (ref 135–145)

## 2016-02-05 MED ORDER — LORAZEPAM 0.5 MG PO TABS
0.5000 mg | ORAL_TABLET | Freq: Once | ORAL | Status: DC
  Filled 2016-02-05: qty 1

## 2016-02-05 MED ORDER — FUROSEMIDE 10 MG/ML IJ SOLN
40.0000 mg | Freq: Two times a day (BID) | INTRAMUSCULAR | Status: DC
  Administered 2016-02-05 – 2016-02-07 (×4): 40 mg via INTRAVENOUS
  Filled 2016-02-05 (×4): qty 4

## 2016-02-05 MED ORDER — FUROSEMIDE 10 MG/ML IJ SOLN
40.0000 mg | Freq: Two times a day (BID) | INTRAMUSCULAR | Status: DC
  Filled 2016-02-05: qty 4

## 2016-02-05 NOTE — Telephone Encounter (Signed)
I called pt to discuss sleep study results. No answer, left a message asking him to call me back.  Noted that pt is in the hospital. Will have sleep study scanned to chart now in case that is needed for pt's care in the hospital.

## 2016-02-05 NOTE — Progress Notes (Signed)
Patient Name: Aaron Lucas Date of Encounter: 02/05/2016  Primary Cardiologist: Dr. [facility] Problem List     Active Problems:   Hypertension   Paroxysmal atrial fibrillation (HCC)   Severe aortic valve stenosis   Acute on chronic combined systolic and diastolic CHF, NYHA class 4 (HCC)   Central sleep apnea due to medical condition   CHF exacerbation ([place])   Chest pain     Subjective   Sleeping, in no acute distress. Breathing not labored, laying mostly flat.   Inpatient Medications    Scheduled Meds: . dabigatran  150 mg Oral Q12H  . furosemide  20 mg Intravenous BID  . LORazepam  0.5 mg Oral Once  . pantoprazole  40 mg Oral Daily  . potassium chloride  20 mEq Oral Daily  . sodium chloride flush  3 mL Intravenous Q12H   Continuous Infusions:   PRN Meds: sodium chloride, acetaminophen, ALPRAZolam, loratadine, ondansetron (ZOFRAN) IV, sodium chloride flush, triamcinolone   Vital Signs    Vitals:   02/04/16 1821 02/04/16 2228 02/05/16 0200 02/05/16 0444  BP: 108/67 103/67 110/70 90/64  Pulse: 96 97 86 66  Resp: 18 18 18 18   Temp:  97.7 F (36.5 C) 98 F (36.7 C) 97.8 F (36.6 C)  TempSrc:  Oral Oral Oral  SpO2: 98% 97% 96% 92%  Weight:    173 lb 3.2 oz (78.6 kg)  Height:        Intake/Output Summary (Last 24 hours) at 02/05/16 0905 Last data filed at 02/05/16 0447  Gross per 24 hour  Intake              360 ml  Output             1075 ml  Net             -715 ml   Filed Weights   02/04/16 0157 02/05/16 0444  Weight: 172 lb 9.6 oz (78.3 kg) 173 lb 3.2 oz (78.6 kg)    Physical Exam    GEN: Well nourished, well developed, in no acute distress.  HEENT: Grossly normal.  Neck: Supple, 7-8 cm  JVD. No carotid bruits, or masses. Cardiac: RRR, 3/6 sytolic murmur at RUSB. No clubbing, cyanosis.   Radials/DP/PT 2+ and equal bilaterally.  Respiratory:  Respirations regular and unlabored, Crackles in BLL.  GI: Soft, nontender, nondistended,  BS + x 4. MS: no deformity or atrophy. Skin: warm and dry, no rash. Neuro:  Strength and sensation are intact. Psych: AAOx3.  Normal affect.  Labs    CBC  Recent Labs  02/03/16 2235  WBC 5.4  HGB 11.4*  HCT 36.5*  MCV 85.5  PLT [id number]*   Basic Metabolic Panel  Recent Labs  02/03/16 2235 02/05/16 0432  NA 135 138  K 3.5 3.5  CL 98* 101  CO2 29 29  GLUCOSE 110* 101*  BUN 23* 22*  CREATININE 1.07 1.04  CALCIUM 8.8* 8.9   Liver Function Tests  Recent Labs  02/04/16 0201  AST 25  ALT 20  ALKPHOS 74  BILITOT 1.6*  PROT 5.8*  ALBUMIN 3.0*   Cardiac Enzymes  Recent Labs  02/04/16 0201 02/04/16 0730 02/04/16 1300  TROPONINI 0.04* 0.04* 0.04*   D-Dimer  Recent Labs  02/04/16 0201  DDIMER <0.27     Telemetry    NSR- Personally Reviewed  ECG    NSR with 1 degree AVB - Personally Reviewed  Radiology    Dg Chest  2 View  Result Date: 02/03/2016 CLINICAL DATA:  Shortness of breath tonight. History of hypertension, recurrent bladder and prostate cancer, and heart disease. EXAM: CHEST  2 VIEW COMPARISON:  Chest radiograph February 01, 2016 FINDINGS: Cardiac silhouette is mildly enlarged and unchanged. Calcified aortic knob. Similar LEFT and increasing RIGHT small pleural effusions with bibasilar atelectasis. Strandy densities LEFT lung base. Pulmonary vascular congestion and mild interstitial prominence. No pneumothorax. Old LEFT posterolateral rib fractures. IMPRESSION: CHF with increasing small pleural effusions. LEFT lung base atelectasis versus confluent edema, less likely pneumonia. Electronically Signed   By: Elon Alas M.D.   On: 02/03/2016 23:19    Cardiac Studies  Echo 11/26/15 - Left ventricle: The cavity size was normal. Systolic function was mildly reduced. The estimated ejection fraction was in the range of 45% to 50%. Wall motion was normal; there were no regional wall motion abnormalities. Doppler parameters are consistent  with a reversible restrictive pattern, indicative of decreased left ventricular diastolic compliance and/or increased left atrial pressure (grade 3 diastolic dysfunction). - Aortic valve: Trileaflet; severely thickened, severely calcified leaflets. Valve mobility was restricted. There was severe stenosis. There was trivial regurgitation. Valve area (VTI): 0.5 cm^2. Valve area (Vmax): 0.43 cm^2. Valve area (Vmean): 0.41 cm^2. - Mitral valve: Moderately calcified annulus. Mildly thickened leaflets . Moderate prolapse, involving the posterior leaflet with possible partial flail leaflet. There was severe regurgitation directed anteriorly. - Left atrium: The atrium was severely dilated. Volume/bsa, ES, (1-plane Simpson&'s, A2C): 68.1 ml/m^2. - Right atrium: The atrium was mildly dilated. - Tricuspid valve: There was mild regurgitation. - Pulmonary arteries: Systolic pressure was moderately increased. PA peak pressure: 54 mm Hg (S). Impressions: - Aortic stenosis has progressed and EF is reduced (prior 55%)    Patient Profile     Aaron Lucas is a 82 y.o. year old male with a history of chronic combined systolic and diastolic CHF, HTN, PAF, critical AS and severe MR secondary to flail posterior leaflet.   Assessment & Plan  1. Acute on chronic combined systolic and diastolic CHF, NYHA class 4 (HCC) Secondary to critical AS ans severe MR. He is not a candidate for advanced therapies. Dr. Martinique spoke with both the patient and his wife yesterday about goals of care. Palliative care would be a good option.   Currently getting IV diuresis for symptom control. He appears comfortable currently.   2.   Severe aortic valve stenosis with severe MR  3.   Paroxysmal atrial fibrillation (HCC) - maintaining SR  - anticoag w/ Pradaxa  4. Chest pain - minimal elevation in troponin more c/w CHF - no ongoing sx - cath 04/2015 w/ LAD 60%, D1 65%, D2 50%, OM1 20%, RCA  20%, RPDA 40% - med rx for non-obs CAD    Signed, Aaron Leas, NP  02/05/2016, 9:05 AM  Patient seen and examined and history reviewed. Agree with above findings and plan. Patient slept poorly last night. This has been a problem at home as well. Restless. Worried about our discussion of prognosis yesterday. Still SOB with minimal activity. Diuresis is fairly modest. Still edematous. Note results of home sleep study demonstrating mod-severe sleep apnea. AHI 38. Consider CPAP titration.  Will increase lasix to 40 mg IV bid.  Patient and wife are receptive to Palliative care input. I think this is a good idea given very poor prognosis. Our goal should be maximizing quality of life. Will order consult.  Rebekka Lobello [place], [place] 02/05/2016 10:15 AM

## 2016-02-05 NOTE — Progress Notes (Signed)
Pt very anxious, claustrophic, and wanting to go home to see his wife. This RN was covering for the patient's main RN, who was on lunch. MD paged for anti-anxiety med. Orders received for Ativan x1. However, pt refused this med when this RN attempted to administer. Medication disposed of in Sharps container. This RN and patient had ice cream in patient's room and watched TV in an attempt to calm patient. Pt only able to relax for short periods of time tonight. NT later called patient's wife to come in and stay with patient. Upon wife entering, patient told his wife that this RN gave him 2 medications. This RN explained to the wife that the patient refused the medication, so it was disposed of. This RN also got the patient's main RN to come in and explain to the wife the medications that the patient actually received early tonight. Will continue to monitor.

## 2016-02-05 NOTE — Evaluation (Signed)
Physical Therapy Evaluation Patient Details Name: Aaron Lucas MRN: [id number] DOB: November 02, 1928 Today's Date: 02/05/2016   History of Present Illness   Pt is an 82 y.o. male with medical history significant of D-CHF, HTN, PAF, MV regurg & AS, and prostate CA. He was admitted with CC of SOB and dx of acute on chronic CHF.  Clinical Impression  Pt admitted with above diagnosis. Pt currently with functional limitations due to the deficits listed below (see PT Problem List). On eval, pt required min assist transfers and min guard assist ambulation with RW 100 feet.  Pt will benefit from skilled PT to increase their independence and safety with mobility to allow discharge to the venue listed below.       Follow Up Recommendations Home health PT;Supervision/Assistance - 24 hour    Equipment Recommendations  None recommended by PT    Recommendations for Other Services       Precautions / Restrictions Precautions Precautions: Fall Restrictions Weight Bearing Restrictions: No      Mobility  Bed Mobility Overal bed mobility: Needs Assistance             General bed mobility comments: Pt received sitting EOB. He reports requiring assist for supine to sit.  Transfers Overall transfer level: Needs assistance Equipment used: Rolling walker (2 wheeled) Transfers: Sit to/from [facility] Sit to Stand: Min assist Stand pivot transfers: Min assist       General transfer comment: verbal cues for hand placement. Assist to stabilize initial standing balance. After multi sit to stand transfers, pt progressed to min guard assist level.  Ambulation/Gait Ambulation/Gait assistance: Min guard;+2 safety/equipment (+2 for chair follow utilized during eval) Ambulation Distance (Feet): 100 Feet Assistive device: Rolling walker (2 wheeled) Gait Pattern/deviations: Step-through pattern;Decreased stride length Gait velocity: decreased Gait velocity interpretation: Below  normal speed for age/gender General Gait Details: Slight shuffle gait. O2 sats remained in the 90s, ranging from 91-96%, on RA.  Stairs            Wheelchair Mobility    Modified Rankin (Stroke Patients Only)       Balance Overall balance assessment: Needs assistance Sitting-balance support: No upper extremity supported;Feet supported Sitting balance-Leahy Scale: Good     Standing balance support: Bilateral upper extremity supported;During functional activity Standing balance-Leahy Scale: Fair Standing balance comment: RW needed for support.                             Pertinent Vitals/Pain Pain Assessment: No/denies pain    Home Living Family/patient expects to be discharged to:: Private residence Living Arrangements: Spouse/significant other Available Help at Discharge: Family;Available 24 hours/day Type of Home: House Home Access: Stairs to enter   [employer] of Steps: 1 Home Layout: One level Home Equipment: Walker - 2 wheels;Shower seat;Bedside commode;Hospital bed      Prior Function Level of Independence: Needs assistance   Gait / Transfers Assistance Needed: Ambulates supervision with RW.  ADL's / Homemaking Assistance Needed: Wife assists with LB dressing.  Comments:       Hand Dominance        Extremity/Trunk Assessment   Upper Extremity Assessment: Defer to OT evaluation           Lower Extremity Assessment: Generalized weakness      Cervical / Trunk Assessment: Kyphotic  Communication   Communication: No difficulties  Cognition Arousal/Alertness: Awake/alert Behavior During Therapy: WFL for tasks assessed/performed Overall Cognitive Status:  Within Functional Limits for tasks assessed                      General Comments      Exercises     Assessment/Plan    PT Assessment Patient needs continued PT services  PT Problem List Decreased strength;Decreased activity tolerance;Decreased  balance;Decreased mobility;Decreased safety awareness          PT Treatment Interventions Gait training;Stair training;Functional mobility training;Balance training;Therapeutic exercise;Therapeutic activities;Patient/family education    PT Goals (Current goals can be found in the Care Plan section)  Acute Rehab PT Goals Patient Stated Goal: home PT Goal Formulation: With patient/family Time For Goal Achievement: 02/19/16 Potential to Achieve Goals: Good    Frequency Min 3X/week   Barriers to discharge        Co-evaluation PT/OT/SLP Co-Evaluation/Treatment: Yes Reason for Co-Treatment: Complexity of the patient's impairments (multi-system involvement);For patient/therapist safety PT goals addressed during session: Mobility/safety with mobility;Balance         End of Session Equipment Utilized During Treatment: Gait belt Activity Tolerance: Patient tolerated treatment well Patient left: in chair;with chair alarm set;with call bell/phone within reach;with family/visitor present Nurse Communication: Mobility status         Time: [id number] PT Time Calculation (min) (ACUTE ONLY): 29 min   Charges:   PT Evaluation $PT Eval Moderate Complexity: 1 Procedure     PT G Codes:        Lorriane Shire 02/05/2016, 11:32 AM

## 2016-02-05 NOTE — Progress Notes (Signed)
PROGRESS NOTE    Aaron Lucas  [id number] DOB: 1928/12/01 DOA: 02/03/2016 PCP: Geoffery Lyons, MD   Brief Narrative:  82 yo male with aortic stenosis, presents with heart failure decompensation. Found critical AS, currently on aggressive diuresis. Not candidate for surgery.   Assessment & Plan:   Active Problems:   Hypertension   Paroxysmal atrial fibrillation (HCC)   Severe aortic valve stenosis   Acute on chronic combined systolic and diastolic CHF, NYHA class 4 (HCC)   Central sleep apnea due to medical condition   CHF exacerbation ([place])   Chest pain   1. Heart failure decompensation. Diastolic dysfunction,  Ef 45 %. Suspected decompensation due to aortic stenosis. Will continue furosemide 40 mg bid, urine output 1075 cc. Symptoms improved, but still persistent lower extremity edema.   2. HTN. Systolic blood pressure 90 to 110. Will continue diuresis with furosemide, not on b  Blockade or ace inh due to risk of hypotension.   3.  Paroxysmal atrial fibrillation. ekg personally reviewed normal sinus, will continue anticoagulation with pradaxa.   4. Severe aortic stenosis/ mitral regurgitation.  Will continue gently diuresis, follow on blood pressure. High risk for hypotension due to pre-load dependence related to AS. Cardiology consult, recommendation for palliative care.  5. Asymmetric edema. Will check [place] lower extremities [place].    DVT prophylaxis: pradaxa  Code Status: DNR  Family Communication: I spoke with patient's wife at the bedside and all questions were addressed.  Disposition Plan: Home   Consultants:   Cardiology   Procedures:    Antimicrobials:   Subjective: Patient deconditioned and weak, dyspnea has improved, persistent lower extremity edema. No chest pain, no nausea or vomiting.   Objective: Vitals:   02/04/16 2228 02/05/16 0200 02/05/16 0444 02/05/16 0950  BP: 103/67 110/70 90/64 105/78  Pulse: 97 86 66 85  Resp: 18 18 18 20   Temp:  97.7 F (36.5 C) 98 F (36.7 C) 97.8 F (36.6 C) 97.3 F (36.3 C)  TempSrc: Oral Oral Oral Oral  SpO2: 97% 96% 92% 93%  Weight:   78.6 kg (173 lb 3.2 oz)   Height:        Intake/Output Summary (Last 24 hours) at 02/05/16 1133 Last data filed at 02/05/16 0955  Gross per 24 hour  Intake              480 ml  Output              550 ml  Net              -70 ml   Filed Weights   02/04/16 0157 02/05/16 0444  Weight: 78.3 kg (172 lb 9.6 oz) 78.6 kg (173 lb 3.2 oz)    Examination:  General exam: deconditioned and ill looking appearing. E ENT:  Mild conjunctival pallor, oral mucosa moist.  Respiratory system: mild rales at bases, no wheezing or rhonchi. Marland Kitchen Respiratory effort normal. Cardiovascular system: S1 & S2 heard, RRR. No JVD, murmurs, rubs, gallops or clicks. Asymmetric edema more right then left. +++ pitting.  Gastrointestinal system: Abdomen is nondistended, soft and nontender. No organomegaly or masses felt. Normal bowel sounds heard. Central nervous system: Alert and oriented. No focal neurological deficits. Extremities: Symmetric 5 x 5 power. Skin: No rashes, lesions or ulcers     Data Reviewed: I have personally reviewed following labs and imaging studies  CBC:  Recent Labs Lab 02/01/16 2227 02/03/16 2235  WBC 5.7 5.4  HGB 11.1* 11.4*  HCT  35.2* 36.5*  MCV 84.4 85.5  PLT 141* [id number]*   Basic Metabolic Panel:  Recent Labs Lab 02/01/16 2227 02/03/16 2235 02/05/16 0432  NA 136 135 138  K 3.8 3.5 3.5  CL 100* 98* 101  CO2 27 29 29   GLUCOSE 112* 110* 101*  BUN 24* 23* 22*  CREATININE 1.15 1.07 1.04  CALCIUM 8.6* 8.8* 8.9  MG 2.2  --   --    GFR: Estimated Creatinine Clearance: 53.6 mL/min (by C-G formula based on SCr of 1.04 mg/dL). Liver Function Tests:  Recent Labs Lab 02/04/16 0201  AST 25  ALT 20  ALKPHOS 74  BILITOT 1.6*  PROT 5.8*  ALBUMIN 3.0*   No results for input(s): LIPASE, AMYLASE in the last 168 hours. No results for input(s):  AMMONIA in the last 168 hours. Coagulation Profile: No results for input(s): INR, PROTIME in the last 168 hours. Cardiac Enzymes:  Recent Labs Lab 02/04/16 0201 02/04/16 0730 02/04/16 1300  TROPONINI 0.04* 0.04* 0.04*   BNP (last 3 results) No results for input(s): PROBNP in the last 8760 hours. HbA1C: No results for input(s): HGBA1C in the last 72 hours. CBG: No results for input(s): GLUCAP in the last 168 hours. Lipid Profile: No results for input(s): CHOL, HDL, LDLCALC, TRIG, CHOLHDL, LDLDIRECT in the last 72 hours. Thyroid Function Tests: No results for input(s): TSH, T4TOTAL, FREET4, T3FREE, THYROIDAB in the last 72 hours. Anemia Panel: No results for input(s): VITAMINB12, FOLATE, FERRITIN, TIBC, IRON, RETICCTPCT in the last 72 hours. Sepsis Labs: No results for input(s): PROCALCITON, LATICACIDVEN in the last 168 hours.  No results found for this or any previous visit (from the past 240 hour(s)).       Radiology Studies: Dg Chest 2 View  Result Date: 02/03/2016 CLINICAL DATA:  Shortness of breath tonight. History of hypertension, recurrent bladder and prostate cancer, and heart disease. EXAM: CHEST  2 VIEW COMPARISON:  Chest radiograph February 01, 2016 FINDINGS: Cardiac silhouette is mildly enlarged and unchanged. Calcified aortic knob. Similar LEFT and increasing RIGHT small pleural effusions with bibasilar atelectasis. Strandy densities LEFT lung base. Pulmonary vascular congestion and mild interstitial prominence. No pneumothorax. Old LEFT posterolateral rib fractures. IMPRESSION: CHF with increasing small pleural effusions. LEFT lung base atelectasis versus confluent edema, less likely pneumonia. Electronically Signed   By: Elon Alas M.D.   On: 02/03/2016 23:19        Scheduled Meds: . dabigatran  150 mg Oral Q12H  . furosemide  40 mg Intravenous BID  . LORazepam  0.5 mg Oral Once  . pantoprazole  40 mg Oral Daily  . potassium chloride  20 mEq Oral  Daily  . sodium chloride flush  3 mL Intravenous Q12H   Continuous Infusions:    LOS: 1 day        Tayler Heiden Gerome Apley, MD [facility] Pager [phone]  If 7PM-7AM, please contact night-coverage [email or web address] Password [facility] 02/05/2016, 11:33 AM

## 2016-02-05 NOTE — Progress Notes (Signed)
Pt. Experiencing restlessness and attempting to get out of bed during the night and requesting med to help him rest. PRN medication administered to pt. And ineffective. Pt. Continued to request med to help him rest. On call MD paged and new orders received for 1x dose of Ativan. Pt. Refused dose and called spouse stating he did not know where he was and wanted to leave. Pt. Stated that wife was on her way to the hospital. RN explained to pts. Spouse that pt. Received medication for rest that was ineffective. Spouse stated she would stay at pts. Bedside for remainder of the night. RN will continue to monitor pt. For changes in condition. Malin Sambrano, Katherine Roan

## 2016-02-05 NOTE — Progress Notes (Signed)
Patient is active with [facility] as prior to admission. Mindi Slicker [facility] [phone]

## 2016-02-05 NOTE — Evaluation (Signed)
Occupational Therapy Evaluation Patient Details Name: Aaron Lucas MRN: [id number] DOB: 04/12/1929 Today's Date: 02/05/2016    History of Present Illness  Pt is an 82 y.o. male with medical history significant of D-CHF, HTN, PAF, MV regurg & AS, and prostate CA. He was admitted with CC of SOB and dx of acute on chronic CHF.   Clinical Impression   Pt ambulates with a RW and supervision at baseline. He is able to sponge bathe and dress with assist for LB due to R LE dressing. He self feeds and grooms with supervision. Pt has longstanding blindness in L eye. Pt presents with generalized weakness, impaired balance and decreased activity tolerance. Pt has a supportive wife who plans to care for him at home. Will follow acutely.    Follow Up Recommendations  No OT follow up;Supervision/Assistance - 24 hour    Equipment Recommendations  None recommended by OT    Recommendations for Other Services       Precautions / Restrictions Precautions Precautions: Fall Restrictions Weight Bearing Restrictions: No      Mobility Bed Mobility Overal bed mobility: Needs Assistance             General bed mobility comments: Pt received sitting EOB. He reports requiring assist for supine to sit.  Transfers Overall transfer level: Needs assistance Equipment used: Rolling walker (2 wheeled) Transfers: Sit to/from Stand Sit to Stand: Min assist Stand pivot transfers: Min assist       General transfer comment: verbal cues for hand placement. Assist to stabilize initial standing balance. After multi sit to stand transfers, pt progressed to min guard assist level.    Balance Overall balance assessment: Needs assistance Sitting-balance support: No upper extremity supported;Feet supported Sitting balance-Leahy Scale: Good     Standing balance support: Bilateral upper extremity supported;During functional activity Standing balance-Leahy Scale: Fair Standing balance comment: RW  needed for support.                            ADL Overall ADL's : Needs assistance/impaired Eating/Feeding: Set up;Sitting   Grooming: Sitting;Wash/dry hands;Set up;Minimal assistance   Upper Body Bathing: Minimal assitance;Sitting   Lower Body Bathing: Maximal assistance;Sit to/from stand   Upper Body Dressing : Minimal assistance;Sitting   Lower Body Dressing: Maximal assistance;Sit to/from stand   Toilet Transfer: Minimal assistance;Ambulation;RW Toilet Transfer Details (indicate cue type and reason): stood to use urinal Toileting- Clothing Manipulation and Hygiene: Minimal assistance;Sit to/from stand       Functional mobility during ADLs: Minimal assistance;Rolling walker       Vision Additional Comments: blind in L eye, hx of cataract surgery   Perception     Praxis      Pertinent Vitals/Pain Pain Assessment: No/denies pain     Hand Dominance Right   Extremity/Trunk Assessment Upper Extremity Assessment Upper Extremity Assessment: Generalized weakness   Lower Extremity Assessment Lower Extremity Assessment: Defer to PT evaluation   Cervical / Trunk Assessment Cervical / Trunk Assessment: Kyphotic   Communication Communication Communication: HOH   Cognition Arousal/Alertness: Awake/alert Behavior During Therapy: WFL for tasks assessed/performed Overall Cognitive Status: History of cognitive impairments - at baseline       Memory: Decreased short-term memory             General Comments       Exercises       Shoulder Instructions      Home Living Family/patient expects to be discharged  to:: Private residence Living Arrangements: Spouse/significant other Available Help at Discharge: Family;Available 24 hours/day Type of Home: House Home Access: Stairs to enter [employer] of Steps: 1   Home Layout: One level     Bathroom Shower/Tub: Occupational psychologist: Standard (with 3 in 1 on top)      Home Equipment: Walker - 2 wheels;Shower seat;Bedside commode;Hospital bed          Prior Functioning/Environment Level of Independence: Needs assistance  Gait / Transfers Assistance Needed: Ambulates supervision with RW. ADL's / Homemaking Assistance Needed: Wife assists with LB dressing.   Comments:          OT Problem List: Decreased strength;Decreased activity tolerance;Impaired balance (sitting and/or standing);Impaired vision/perception;Decreased cognition;Decreased safety awareness;Decreased knowledge of use of DME or AE;Cardiopulmonary status limiting activity;Increased edema   OT Treatment/Interventions: Self-care/ADL training;Patient/family education;Balance training;DME and/or AE instruction;Therapeutic activities    OT Goals(Current goals can be found in the care plan section) Acute Rehab OT Goals Patient Stated Goal: home OT Goal Formulation: With patient Time For Goal Achievement: 02/19/16 Potential to Achieve Goals: Good  OT Frequency: Min 2X/week   Barriers to D/C:            Co-evaluation PT/OT/SLP Co-Evaluation/Treatment: Yes Reason for Co-Treatment: Complexity of the patient's impairments (multi-system involvement);For patient/therapist safety PT goals addressed during session: Mobility/safety with mobility;Balance OT goals addressed during session: ADL's and self-care      End of Session Equipment Utilized During Treatment: Gait belt;Rolling walker  Activity Tolerance: Patient tolerated treatment well Patient left: in chair;with call bell/phone within reach;with chair alarm set;with family/visitor present   Time: [id number] OT Time Calculation (min): 38 min Charges:  OT General Charges $OT Visit: 1 Procedure OT Evaluation $OT Eval Moderate Complexity: 1 Procedure G-Codes:    Malka So 02/05/2016, 2:13 PM  [phone]

## 2016-02-06 ENCOUNTER — Inpatient Hospital Stay (HOSPITAL_COMMUNITY): Payer: Medicare Other

## 2016-02-06 DIAGNOSIS — R0601 Orthopnea: Secondary | ICD-10-CM

## 2016-02-06 DIAGNOSIS — R609 Edema, unspecified: Secondary | ICD-10-CM

## 2016-02-06 DIAGNOSIS — R0602 Shortness of breath: Secondary | ICD-10-CM

## 2016-02-06 DIAGNOSIS — Z515 Encounter for palliative care: Secondary | ICD-10-CM

## 2016-02-06 DIAGNOSIS — Z7189 Other specified counseling: Secondary | ICD-10-CM

## 2016-02-06 DIAGNOSIS — K5901 Slow transit constipation: Secondary | ICD-10-CM

## 2016-02-06 LAB — BASIC METABOLIC PANEL
Anion gap: 9 (ref 5–15)
BUN: 27 mg/dL — ABNORMAL HIGH (ref 6–20)
CO2: 30 mmol/L (ref 22–32)
Calcium: 9.1 mg/dL (ref 8.9–10.3)
Chloride: 100 mmol/L — ABNORMAL LOW (ref 101–111)
Creatinine, Ser: 1.07 mg/dL (ref 0.61–1.24)
GFR calc Af Amer: >60 mL/min (ref >60)
GFR calc non Af Amer: >60 mL/min (ref >60)
Glucose, Bld: 110 mg/dL — ABNORMAL HIGH (ref 65–99)
Potassium: 3.9 mmol/L (ref 3.5–5.1)
Sodium: 139 mmol/L (ref 135–145)

## 2016-02-06 LAB — MAGNESIUM: Magnesium: 2.1 mg/dL (ref 1.7–2.4)

## 2016-02-06 MED ORDER — DEXAMETHASONE 0.5 MG PO TABS
1.0000 mg | ORAL_TABLET | Freq: Every day | ORAL | Status: DC
  Administered 2016-02-06 – 2016-02-07 (×2): 1 mg via ORAL
  Filled 2016-02-06 (×2): qty 2

## 2016-02-06 MED ORDER — METOLAZONE 2.5 MG PO TABS
2.5000 mg | ORAL_TABLET | Freq: Once | ORAL | Status: AC
  Administered 2016-02-06: 2.5 mg via ORAL
  Filled 2016-02-06: qty 1

## 2016-02-06 MED ORDER — MORPHINE SULFATE (CONCENTRATE) 10 MG/0.5ML PO SOLN: 2.5000 mg | ORAL | Status: DC | PRN

## 2016-02-06 MED ORDER — SENNA 8.6 MG PO TABS
2.0000 | ORAL_TABLET | Freq: Every day | ORAL | Status: DC
  Administered 2016-02-07 – 2016-02-08 (×2): 17.2 mg via ORAL
  Filled 2016-02-06 (×2): qty 2

## 2016-02-06 NOTE — Progress Notes (Addendum)
*  PRELIMINARY RESULTS* Vascular Ultrasound Bilateral lower extremity duplex has been completed.  Preliminary findings: No evidence of deep vein thrombosis in the visualized veins. Right calf veins were not visualized due to bandage. Negative for baker's cysts bilaterally.   Aaron Lucas 02/06/2016, 11:45 AM

## 2016-02-06 NOTE — Consult Note (Signed)
Consultation Note Date: 02/06/2016   Patient Name: Aaron Lucas  DOB: [birth year]  MRN: [id number]  Age / Sex: 82 y.o., male  PCP: Burnard Bunting, MD Referring Physician: Jimmy Picket Arrien, *  Reason for Consultation: Establishing goals of care and Non pain symptom management  HPI/Patient Profile: 82 y.o. male  with past medical history of CHF NYHA Class 4 (echo 8/15- EF 45%), severe aortic stenosis, HTN, mitral valve regurge, paroxysmal a-fib admitted on 02/03/2016 with intermittent chest discomfort, SOB. Workup reveals CHF exacerbation d/t severe aortic valve stenosis and mitral regurge. He has had 2 admissions to the hospital and 5 ED visits in the last 6 months.    Clinical Assessment and Goals of Care:  Met with patient and his wife. 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 His wife was a Pharmacist, hospital. They have a son who lives one block from them and assist as needed. Patient derives joy from visiting with his family, and most recently enjoys visiting with their Granddaughter. Patient and wife have noted decline in patient's cognition and function over the last several months. Patient is more forgetful. This worsens at night. Patient and wife have discussed advance directives and code status. Patient desires DNR status and goals of returning home and being as active and comfortable as possible. Introduced palliative medicine and answered questions related to palliative medicine and Hospice philosophy, services provided, and possibilities of outpatient services. Patient's main complaints are SOB with activity, states he is comfortable at rest, and fatigue. SOB worsens with lying down at night and ambulating. [facility] but is no longer able. He would like to be able to go out to dinner with his wife. Also c/o constipation with no BM x 3 days. Discussed  interventions for comfort including low dose morphine for SOB, premedicating with morphine prior to exertional activities, daily low dose dexamethasone for fatigue and SOB, sleeping with head elevated for increased comfort, senna for bowel management. They would like to try these interventions.  Primay decision maker: Patient and spouse    SUMMARY OF RECOMMENDATIONS -Palliative services follow-up to continue [place]- pt may be eligible for Hospice, but they are reluctant, they are considering palliative service referral and will contact PMT if desired  -Morphine Intensol- 2.82m SL Q2hr PRN SOB -Senna 17.236mpo daily for  -dexamethasone 82m882mam -Given patient's Class 4 symptoms, chest pain prior to admission, maximized use of medical therapies, and not being a candidate for invasive procedure, he is likely eligible for Hospice services, however, patient and family need to be eased into this recommendation gently. Would recommend referral for Palliative services at home with goal of transition to Hospice care.  Code Status/Advance Care Planning:  DNR    Symptom Management:  See above recommendations  Palliative Prophylaxis:   Frequent assessment for SOB  Additional Recommendations (Limitations, Scope, Preferences):  Avoid Hospitalization  Psycho-social/Spiritual:   Desire for further Chaplaincy support:No  Additional Recommendations: Education on Hospice  Prognosis:   < 6 months d/t CHF Class 4 with increasing exacerbations and hospitalizations, medical therapy maximized, patient and wife desire for comfort.  Discharge Planning: Home with Palliative Services      Primary Diagnoses: Present on Admission: . Acute on chronic combined systolic and diastolic CHF, NYHA class 4 ([place]) . Central sleep apnea due to medical condition . Essential hypertension . Paroxysmal atrial fibrillation (HCC) . Chest pain   I have reviewed the medical record, interviewed the patient and family,  and examined the patient. The following aspects are pertinent.  Past Medical History:  Diagnosis Date  . Cataracts, bilateral   . Chronic diastolic CHF (congestive heart failure) ([place]) 11/25/2015  . Detached retina Twice   As a result he is lost vision in the left eye.  . Diverticulosis 2006  . Hx of colonic polyps 2006,  2011  . Hypertension   . Paroxysmal atrial fibrillation (HCC)    On Pradaxa.   . Prostate cancer ([place]) 2004   seed implant  . 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 Valvular heart disease    MV regurge and Ao stenosis  . Vertigo    Social History   Social History  . Marital status: Married    Spouse name: N/A  . Number of children: 1  . Years of education: N/A   Occupational History  . retired-Magistrate Retired   Social History Main Topics  . Smoking status: Never Smoker  . Smokeless tobacco: Never Used  . Alcohol use Yes     Comment: occassionally  . Drug use: No  . Sexual activity: Not Asked   Other Topics Concern  . None   Social History Narrative   [employer]   Was exercising 5-6 days a week    Blood type O Positive   Lowe sodium diet   Family History  Problem Relation Age of Onset  . Pulmonary embolism Father 72    after surgery  . Cancer Mother   . Other       there is no known coronary artery disease   . Heart attack Neg Hx    Scheduled Meds: . dabigatran  150 mg Oral Q12H  . dexamethasone  1 mg Oral Daily  . furosemide  40 mg Intravenous BID  . LORazepam  0.5 mg Oral Once  . pantoprazole  40 mg Oral Daily  . potassium chloride  20 mEq Oral Daily   Continuous Infusions:  PRN Meds:.acetaminophen, ALPRAZolam, loratadine, morphine CONCENTRATE, ondansetron (ZOFRAN) IV, triamcinolone Medications Prior to Admission:  Prior to Admission medications   Medication Sig Start Date End Date Taking? Authorizing Provider  ALPRAZolam (XANAX) 0.25 MG tablet Take 1 tablet (0.25 mg total) by mouth at bedtime as needed  for anxiety. 01/22/16  Yes Carmen Dohmeier, MD  clindamycin (CLEOCIN T) 1 % lotion Apply 1 application topically as needed (RASH).    Yes Historical Provider, MD  dabigatran (PRADAXA) 150 MG CAPS capsule Take 1 capsule (150 mg total) by mouth every 12 (twelve) hours. 11/14/15  Yes Minus Breeding, MD  furosemide (LASIX) 40 MG tablet Take 1 tablet (40 mg total) by mouth daily. 01/08/16  Yes Rhonda G Barrett, PA-C  lisinopril (PRINIVIL,ZESTRIL) 2.5 MG tablet Take 1 tablet (2.5 mg total) by mouth daily. 12/01/15  Yes Orson Eva, MD  loratadine (CLARITIN) 10 MG tablet Take 10 mg by mouth daily as needed for allergies.    Yes Historical Provider, MD  metroNIDAZOLE (METROCREAM) 0.75 %  cream Apply 1 application topically 2 (two) times daily as needed (for skin lesions).    Yes Historical Provider, MD  Multiple Vitamins-Minerals (MULTIVITAMIN WITH MINERALS) tablet Take 1 tablet by mouth daily.   Yes Historical Provider, MD  Omega-3 Fatty Acids (FISH OIL PO) Take 1 capsule by mouth daily.    Yes Historical Provider, MD  omeprazole (PRILOSEC) 40 MG capsule Take 40 mg by mouth daily. 12/18/14  Yes Historical Provider, MD  triamcinolone (NASACORT ALLERGY 24HR) 55 MCG/ACT AERO nasal inhaler Place 1 spray into both nostrils daily as needed (for congestion).    Yes Historical Provider, MD  vitamin C (ASCORBIC ACID) 500 MG tablet Take 500 mg by mouth daily.   Yes Historical Provider, MD   Allergies  Allergen Reactions  . Ambien [Zolpidem Tartrate] Other (See Comments)    Per patient and wife "hallucinations and sleep walking" (tried to climb out of the windows in the middle of the night)  . Diamox [Acetazolamide] Other (See Comments)    UNSPECIFIED   . Other Other (See Comments)    Nuts, pecans only--unknown  . Penicillins Swelling and Rash    SWELLING REACTION UNSPECIFIED  Has patient had a PCN reaction causing immediate rash, facial/tongue/throat swelling, SOB or lightheadedness with hypotension: unknown Has  patient had a PCN reaction causing severe rash involving mucus membranes or skin necrosis: No Has patient had a PCN reaction that required hospitalization No Has patient had a PCN reaction occurring within the last 10 years: No    . Ciprofloxacin Rash  . Doxycycline Rash  . Erythromycin Rash  . Neptazane [Methazolamide] Other (See Comments)    UNSPECIFIED    Review of Systems  Constitutional: Positive for activity change, appetite change, fatigue and unexpected weight change.  HENT: Negative.   Eyes: Negative.   Respiratory: Positive for cough and shortness of breath.        Orthopnea   Cardiovascular: Positive for chest pain, palpitations and leg swelling.  Gastrointestinal: Positive for abdominal distention and constipation.  Endocrine: Negative.   Genitourinary: Positive for frequency.  Musculoskeletal: Negative.   Skin: Positive for color change.  Allergic/Immunologic: Negative.   Neurological: Positive for weakness.  Psychiatric/Behavioral: Positive for sleep disturbance.    Physical Exam  Constitutional: He is oriented to person, place, and time.  HENT:  Head: Normocephalic and atraumatic.  Cardiovascular: Normal rate and regular rhythm.   Murmur heard. +JVD, bruit  Pulmonary/Chest: Effort normal. He has wheezes.  Abdominal: Soft. Bowel sounds are normal.  Musculoskeletal: He exhibits edema.  BLE edema  Neurological: He is alert and oriented to person, place, and time.  Psychiatric: He has a normal mood and affect. His behavior is normal. Thought content normal.    Vital Signs: BP (!) 118/92   Pulse 91   Temp 98 F (36.7 C) (Oral)   Resp 20   Ht 5' 10" (1.778 m)   Wt 78.3 kg (172 lb 9.6 oz) Comment: b scale  SpO2 98%   BMI 24.77 kg/m  Pain Assessment: No/denies pain   Pain Score: 0-No pain   SpO2: SpO2: 98 % O2 Device:SpO2: 98 % O2 Flow Rate: .O2 Flow Rate (L/min): 2 L/min  IO: Intake/output summary:  Intake/Output Summary (Last 24 hours) at  02/06/16 1053 Last data filed at 02/06/16 1042  Gross per 24 hour  Intake              600 ml  Output  816 ml  Net             -216 ml    LBM: Last BM Date: 02/05/16 Baseline Weight: Weight: 78.3 kg (172 lb 9.6 oz) Most recent weight: Weight: 78.3 kg (172 lb 9.6 oz) (b scale)     Palliative Assessment/Data: PPS: 50 %     Time In: 1000  Time Out: 1145 Time Total: 105 mins Greater than 50%  of this time was spent counseling and coordinating care related to the above assessment and plan.  Signed by: Payton Emerald, NP   Please contact Palliative Medicine Team phone at [phone] for questions and concerns.  For individual provider: See Shea Evans

## 2016-02-06 NOTE — Progress Notes (Signed)
Pt off floor for venous doppler of bilateral lower extremities.

## 2016-02-06 NOTE — Progress Notes (Signed)
Patient Name: Aaron Lucas Date of Encounter: 02/06/2016  Primary Cardiologist: Dr. [facility] Problem List     Active Problems:   Essential hypertension   Paroxysmal atrial fibrillation (Aaron Lucas)   Severe aortic valve stenosis   Acute on chronic combined systolic and diastolic CHF, NYHA class 4 (Aaron Lucas)   Central sleep apnea due to medical condition   Acute on chronic congestive heart failure (Aaron Lucas)   Chest pain     Subjective   Feels well, sitting up in chair and alert. No chest pain. Says he is breathing without difficulty.   Inpatient Medications    Scheduled Meds: . dabigatran  150 mg Oral Q12H  . furosemide  40 mg Intravenous BID  . LORazepam  0.5 mg Oral Once  . pantoprazole  40 mg Oral Daily  . potassium chloride  20 mEq Oral Daily   Continuous Infusions:   PRN Meds: acetaminophen, ALPRAZolam, loratadine, ondansetron (ZOFRAN) IV, triamcinolone   Vital Signs    Vitals:   02/05/16 2120 02/06/16 0359 02/06/16 0658 02/06/16 0829  BP: 105/74  100/62 (!) 118/92  Pulse: 94  91   Resp: (!) 22  20   Temp: 97.7 F (36.5 C)  98 F (36.7 C)   TempSrc: Oral  Oral   SpO2: 98%  98%   Weight:  172 lb 9.6 oz (78.3 kg)    Height:        Intake/Output Summary (Last 24 hours) at 02/06/16 0924 Last data filed at 02/06/16 0034  Gross per 24 hour  Intake              480 ml  Output              596 ml  Net             -116 ml   Filed Weights   02/04/16 0157 02/05/16 0444 02/06/16 0359  Weight: 172 lb 9.6 oz (78.3 kg) 173 lb 3.2 oz (78.6 kg) 172 lb 9.6 oz (78.3 kg)    Physical Exam   GEN: Well nourished, well developed, in no acute distress.  HEENT: Grossly normal.  Neck: Supple, no JVD, carotid bruits, or masses. Cardiac: RRR, 3/6 systolic murmur ar RUSB. No rubs, or gallops. No clubbing, cyanosis, edema.  Radials/DP/PT 2+ and equal bilaterally.  Respiratory:  Respirations regular and unlabored, clear to auscultation bilaterally. GI: Soft, nontender,  nondistended, BS + x 4. MS: no deformity or atrophy. Skin: warm and dry, no rash. Neuro:  Strength and sensation are intact. Psych: AAOx3.  Normal affect.  Labs    CBC  Recent Labs  02/03/16 2235  WBC 5.4  HGB 11.4*  HCT 36.5*  MCV 85.5  PLT [id number]*   Basic Metabolic Panel  Recent Labs  02/05/16 0432 02/06/16 0404  NA 138 139  K 3.5 3.9  CL 101 100*  CO2 29 30  GLUCOSE 101* 110*  BUN 22* 27*  CREATININE 1.04 1.07  CALCIUM 8.9 9.1  MG  --  2.1   Liver Function Tests  Recent Labs  02/04/16 0201  AST 25  ALT 20  ALKPHOS 74  BILITOT 1.6*  PROT 5.8*  ALBUMIN 3.0*   Cardiac Enzymes  Recent Labs  02/04/16 0201 02/04/16 0730 02/04/16 1300  TROPONINI 0.04* 0.04* 0.04*   BNP Invalid input(s): POCBNP D-Dimer  Recent Labs  02/04/16 0201  DDIMER <0.27    Telemetry    NSR- Personally Reviewed  ECG    NSR with  1 degree AVB - Personally Reviewed  Radiology    No results found.  Cardiac Studies  Echo 11/26/15 - Left ventricle: The cavity size was normal. Systolic function was mildly reduced. The estimated ejection fraction was in the range of 45% to 50%. Wall motion was normal; there were no regional wall motion abnormalities. Doppler parameters are consistent with a reversible restrictive pattern, indicative of decreased left ventricular diastolic compliance and/or increased left atrial pressure (grade 3 diastolic dysfunction). - Aortic valve: Trileaflet; severely thickened, severely calcified leaflets. Valve mobility was restricted. There was severe stenosis. There was trivial regurgitation. Valve area (VTI): 0.5 cm^2. Valve area (Vmax): 0.43 cm^2. Valve area (Vmean): 0.41 cm^2. - Mitral valve: Moderately calcified annulus. Mildly thickened leaflets . Moderate prolapse, involving the posterior leaflet with possible partial flail leaflet. There was severe regurgitation directed anteriorly. - Left atrium: The atrium was  severely dilated. Volume/bsa, ES, (1-plane Simpson&'s, A2C): 68.1 ml/m^2. - Right atrium: The atrium was mildly dilated. - Tricuspid valve: There was mild regurgitation. - Pulmonary arteries: Systolic pressure was moderately increased. PA peak pressure: 54 mm Hg (S). Impressions: - Aortic stenosis has progressed and EF is reduced (prior 55%)    Patient Profile     Aaron Lucas a 82 y.o.year old malewith a history of chronic combined systolic and diastolic CHF, HTN, PAF, critical AS and severe MR secondary to flail posterior leaflet  Assessment & Plan    1. Acute on chronic combined systolic and diastolic CHF, NYHA class 4 (Aaron Lucas) Secondary to critical AS ans severe MR. He is not a candidate for advanced therapies. Dr. Martinique spoke with both the patient and his wife yesterday about goals of care. Palliative care consulted.   Lasix increased to 40mg  IV BID, question accuracy of I&O's however weight is only 1 pound less than yesterday. Consider repeat chest x ray to assess CHF.   2. Severe aortic valve stenosiswith severe MR  3. Paroxysmal atrial fibrillation (Aaron Lucas) - maintaining SR  - anticoag w/ Pradaxa  4. Chest pain - minimal elevation in troponin more c/w CHF - no ongoing sx - cath 04/2015 w/ LAD 60%, D1 65%, D2 50%, OM1 20%, RCA 20%, RPDA 40% - med rx for non-obs CAD    Signed, Arbutus Leas, NP  02/06/2016, 9:24 AM  Patient seen and examined and history reviewed. Agree with above findings and plan. Still not much diuresis with increased lasix. Will give metolazone 2.5 mg po x 1 today. Palliative care to see today.  Lashon Hillier Martinique, [place] 02/06/2016 11:11 AM

## 2016-02-06 NOTE — Progress Notes (Signed)
PROGRESS NOTE    Aaron Lucas  [id number] DOB: 1928/12/17 DOA: 02/03/2016 PCP: Geoffery Lyons, MD   Brief Narrative:  82 yo male with aortic stenosis, presents with decompensated heart failure. Found in critical AS. Gentle diuresis. Not candidate for surgical intervention.  Assessment & Plan:   Active Problems:   Essential hypertension   Paroxysmal atrial fibrillation (HCC)   Severe aortic valve stenosis   Acute on chronic combined systolic and diastolic CHF, NYHA class 4 (HCC)   Central sleep apnea due to medical condition   Acute on chronic congestive heart failure (HCC)   Chest pain   Orthopnea   SOB (shortness of breath)   Slow transit constipation   Palliative care by specialist   Advance care planning   1. Heart failure decompensation. Dyastolic dysfunction will continue to target negative fluid balance. Blood pressure systolic 123456. Added metolazone.  2, HTN.Blood pressure with systolic in the [id number]. Will continue gentle diuresis.  3. Paroxysmal atrial fibrillation.On sinus rhythm will continue telemetry monitoring and anticoagulation.  4. Severe aortic stenosis and mitral regurgitation. Patient not candidate for  Valve repair.   .5 Asymmetric lower extremity edema. Ultrasound negative for dvt.   DVT prophylaxis:pradaxa Code Status: dnr Family Communication: I spoke with patient's wife at the bedside and all questions were addressed. Disposition Plan: snf  Consultants:   Cardiology  Procedures:   Antimicrobials:   Subjective: Patient feeling, dyspnea improving, no chest pain, nausea or vomiting.  Objective: Vitals:   02/06/16 0658 02/06/16 0829 02/06/16 1300 02/06/16 2003  BP: 100/62 (!) 118/92 104/66   Pulse: 91  78 (P) 91  Resp: 20  18 (P) 17  Temp: 98 F (36.7 C)  97.8 F (36.6 C) (P) 98.2 F (36.8 C)  TempSrc: Oral  Oral (P) Oral  SpO2: 98%  100%   Weight:      Height:        Intake/Output Summary (Last 24 hours) at 02/06/16  2049 Last data filed at 02/06/16 2000  Gross per 24 hour  Intake             1200 ml  Output             1922 ml  Net             -722 ml   Filed Weights   02/04/16 0157 02/05/16 0444 02/06/16 0359  Weight: 78.3 kg (172 lb 9.6 oz) 78.6 kg (173 lb 3.2 oz) 78.3 kg (172 lb 9.6 oz)    Examination:  General exam:  Deconditioned. E ENT: mild pallor, oral mucosa moist.  Respiratory system: Clear to auscultation. Respiratory effort normal. Mild rales at bases. Cardiovascular system: S1 & S2 heard, RRR. No JVD,, rubs, gallops or clicks. ++ pitting edema, more right than left. Precordial murmur 3/6 systolic.  Gastrointestinal system: Abdomen is nondistended, soft and nontender. No organomegaly or masses felt. Normal bowel sounds heard. Central nervous system: Alert and oriented. No focal neurological deficits. Extremities: Symmetric 5 x 5 power. Skin: No rashes, lesions or ulcers     Data Reviewed: I have personally reviewed following labs and imaging studies  CBC:  Recent Labs Lab 02/01/16 2227 02/03/16 2235  WBC 5.7 5.4  HGB 11.1* 11.4*  HCT 35.2* 36.5*  MCV 84.4 85.5  PLT 141* [id number]*   Basic Metabolic Panel:  Recent Labs Lab 02/01/16 2227 02/03/16 2235 02/05/16 0432 02/06/16 0404  NA 136 135 138 139  K 3.8 3.5 3.5 3.9  CL 100* 98*  101 100*  CO2 27 29 29 30   GLUCOSE 112* 110* 101* 110*  BUN 24* 23* 22* 27*  CREATININE 1.15 1.07 1.04 1.07  CALCIUM 8.6* 8.8* 8.9 9.1  MG 2.2  --   --  2.1   GFR: Estimated Creatinine Clearance: 52.1 mL/min (by C-G formula based on SCr of 1.07 mg/dL). Liver Function Tests:  Recent Labs Lab 02/04/16 0201  AST 25  ALT 20  ALKPHOS 74  BILITOT 1.6*  PROT 5.8*  ALBUMIN 3.0*   No results for input(s): LIPASE, AMYLASE in the last 168 hours. No results for input(s): AMMONIA in the last 168 hours. Coagulation Profile: No results for input(s): INR, PROTIME in the last 168 hours. Cardiac Enzymes:  Recent Labs Lab 02/04/16 0201  02/04/16 0730 02/04/16 1300  TROPONINI 0.04* 0.04* 0.04*   BNP (last 3 results) No results for input(s): PROBNP in the last 8760 hours. HbA1C: No results for input(s): HGBA1C in the last 72 hours. CBG: No results for input(s): GLUCAP in the last 168 hours. Lipid Profile: No results for input(s): CHOL, HDL, LDLCALC, TRIG, CHOLHDL, LDLDIRECT in the last 72 hours. Thyroid Function Tests: No results for input(s): TSH, T4TOTAL, FREET4, T3FREE, THYROIDAB in the last 72 hours. Anemia Panel: No results for input(s): VITAMINB12, FOLATE, FERRITIN, TIBC, IRON, RETICCTPCT in the last 72 hours. Sepsis Labs: No results for input(s): PROCALCITON, LATICACIDVEN in the last 168 hours.  No results found for this or any previous visit (from the past 240 hour(s)).       Radiology Studies: No results found.      Scheduled Meds: . dabigatran  150 mg Oral Q12H  . dexamethasone  1 mg Oral Daily  . furosemide  40 mg Intravenous BID  . LORazepam  0.5 mg Oral Once  . pantoprazole  40 mg Oral Daily  . potassium chloride  20 mEq Oral Daily  . senna  2 tablet Oral Daily   Continuous Infusions:    LOS: 2 days        Mauricio Gerome Apley, MD [facility] Pager [phone]  If 7PM-7AM, please contact night-coverage [email or web address] Password [facility] 02/06/2016, 8:49 PM

## 2016-02-07 LAB — BASIC METABOLIC PANEL
Anion gap: 10 (ref 5–15)
BUN: 32 mg/dL — AB (ref 6–20)
CO2: 29 mmol/L (ref 22–32)
CREATININE: 1.12 mg/dL (ref 0.61–1.24)
Calcium: 9.3 mg/dL (ref 8.9–10.3)
Chloride: 99 mmol/L — ABNORMAL LOW (ref 101–111)
GFR, EST NON AFRICAN AMERICAN: 58 mL/min — AB (ref >60)
Glucose, Bld: 106 mg/dL — ABNORMAL HIGH (ref 65–99)
POTASSIUM: 3.3 mmol/L — AB (ref 3.5–5.1)
SODIUM: 138 mmol/L (ref 135–145)

## 2016-02-07 MED ORDER — FUROSEMIDE 40 MG PO TABS
40.0000 mg | ORAL_TABLET | Freq: Two times a day (BID) | ORAL | Status: DC
  Administered 2016-02-07 – 2016-02-08 (×2): 40 mg via ORAL
  Filled 2016-02-07 (×2): qty 1

## 2016-02-07 NOTE — Progress Notes (Signed)
Patient Name: Aaron Lucas Date of Encounter: 02/07/2016  Primary Cardiologist: Dr. [facility] Problem List     Active Problems:   Essential hypertension   Paroxysmal atrial fibrillation (HCC)   Severe aortic valve stenosis   Acute on chronic combined systolic and diastolic CHF, NYHA class 4 ([place])   Central sleep apnea due to medical condition   Acute on chronic congestive heart failure (HCC)   Chest pain   Orthopnea   SOB (shortness of breath)   Slow transit constipation   Palliative care by specialist   Advance care planning     Subjective   Feels well, sitting up in chair and alert. No chest pain. Says he is breathing better.  Inpatient Medications    Scheduled Meds: . dabigatran  150 mg Oral Q12H  . dexamethasone  1 mg Oral Daily  . furosemide  40 mg Intravenous BID  . LORazepam  0.5 mg Oral Once  . pantoprazole  40 mg Oral Daily  . potassium chloride  20 mEq Oral Daily  . senna  2 tablet Oral Daily   Continuous Infusions:   PRN Meds: acetaminophen, ALPRAZolam, loratadine, morphine CONCENTRATE, ondansetron (ZOFRAN) IV, triamcinolone   Vital Signs    Vitals:   02/06/16 1300 02/06/16 2003 02/07/16 0643 02/07/16 1234  BP: 104/66 103/61 106/69 107/67  Pulse: 78 91 92 90  Resp: 18 17 18 18   Temp: 97.8 F (36.6 C) 98.2 F (36.8 C) 97.9 F (36.6 C) 97.4 F (36.3 C)  TempSrc: Oral Oral Oral Oral  SpO2: 100% 98% 97% 100%  Weight:   168 lb 1.6 oz (76.2 kg)   Height:        Intake/Output Summary (Last 24 hours) at 02/07/16 1329 Last data filed at 02/07/16 1235  Gross per 24 hour  Intake             1300 ml  Output             3276 ml  Net            -1976 ml   Filed Weights   02/05/16 0444 02/06/16 0359 02/07/16 0643  Weight: 173 lb 3.2 oz (78.6 kg) 172 lb 9.6 oz (78.3 kg) 168 lb 1.6 oz (76.2 kg)    Physical Exam   GEN: Well nourished, well developed, in no acute distress.  HEENT: Grossly normal.  Neck: Supple, no JVD, carotid  bruits, or masses. Cardiac: RRR, 3/6 systolic murmur ar RUSB. 3/6 blowing systolic murmur at the apex. No rubs, or gallops. No clubbing, cyanosis, edema.  Radials/DP/PT 2+ and equal bilaterally.  Respiratory:  Respirations regular and unlabored, clear to auscultation bilaterally. GI: Soft, nontender, nondistended, BS + x 4. Left leg wrapped. Decreased edema in thigh Skin: warm and dry, no rash. Neuro:  Strength and sensation are intact. Psych: AAOx3.  Normal affect.  Labs    CBC No results for input(s): WBC, NEUTROABS, HGB, HCT, MCV, PLT in the last 72 hours. Basic Metabolic Panel  Recent Labs  02/06/16 0404 02/07/16 0344  NA 139 138  K 3.9 3.3*  CL 100* 99*  CO2 30 29  GLUCOSE 110* 106*  BUN 27* 32*  CREATININE 1.07 1.12  CALCIUM 9.1 9.3  MG 2.1  --    Liver Function Tests No results for input(s): AST, ALT, ALKPHOS, BILITOT, PROT, ALBUMIN in the last 72 hours. Cardiac Enzymes No results for input(s): CKTOTAL, CKMB, CKMBINDEX, TROPONINI in the last 72 hours. BNP Invalid input(s):  POCBNP D-Dimer No results for input(s): DDIMER in the last 72 hours.  Telemetry    NSR- Personally Reviewed  ECG    NSR with 1 degree AVB - Personally Reviewed  Radiology    No results found.  Cardiac Studies  Echo 11/26/15 - Left ventricle: The cavity size was normal. Systolic function was mildly reduced. The estimated ejection fraction was in the range of 45% to 50%. Wall motion was normal; there were no regional wall motion abnormalities. Doppler parameters are consistent with a reversible restrictive pattern, indicative of decreased left ventricular diastolic compliance and/or increased left atrial pressure (grade 3 diastolic dysfunction). - Aortic valve: Trileaflet; severely thickened, severely calcified leaflets. Valve mobility was restricted. There was severe stenosis. There was trivial regurgitation. Valve area (VTI): 0.5 cm^2. Valve area (Vmax): 0.43  cm^2. Valve area (Vmean): 0.41 cm^2. - Mitral valve: Moderately calcified annulus. Mildly thickened leaflets . Moderate prolapse, involving the posterior leaflet with possible partial flail leaflet. There was severe regurgitation directed anteriorly. - Left atrium: The atrium was severely dilated. Volume/bsa, ES, (1-plane Simpson&'s, A2C): 68.1 ml/m^2. - Right atrium: The atrium was mildly dilated. - Tricuspid valve: There was mild regurgitation. - Pulmonary arteries: Systolic pressure was moderately increased. PA peak pressure: 54 mm Hg (S). Impressions: - Aortic stenosis has progressed and EF is reduced (prior 55%)    Patient Profile     Aaron Belville Raybornis a 82 y.o.year old malewith a history of chronic combined systolic and diastolic CHF, HTN, PAF, critical AS and severe MR secondary to flail posterior leaflet  Assessment & Plan    1. Acute on chronic combined systolic and diastolic CHF, NYHA class 4 (HCC) Secondary to critical AS ans severe MR. He is not a candidate for advanced therapies.  Palliative care consulted. Improved diuresis yesterday after po metolazone.   2. Severe aortic valve stenosiswith severe MR  3. Paroxysmal atrial fibrillation (HCC) - maintaining SR  - anticoag w/ Pradaxa  4. Chest pain - minimal elevation in troponin more c/w CHF - no ongoing sx - cath 04/2015 w/ LAD 60%, D1 65%, D2 50%, OM1 20%, RCA 20%, RPDA 40% - med rx for non-obs CAD   Patient being considered for DC to facility. Wife states [facility] has a bed. Will change IV lasix to po 40 mg bid. Patient has follow up with Dr. Percival Spanish on Nov 3.   Signed,  Marleena Shubert [place], [place] 02/07/2016 1:29 PM

## 2016-02-07 NOTE — Progress Notes (Signed)
PROGRESS NOTE    Aaron Lucas  [id number] DOB: 1928-07-27 DOA: 02/03/2016 PCP: Geoffery Lyons, MD    Brief Narrative:  This is an 82 year old male who presented to the hospital with a chief complaint of dyspnea. At home patient had been developing worsening shortness of breath, associated with lower extremity edema. Patient had recurrent exacerbations of his heart failure, follows outpatient by cardiology. He does have valvular heart disease with aortic stenosis and mitral regurgitation. On initial physical examination his blood pressure was 90/60, heart rate 87, oxygen saturation 86% on room air, afebrile. He was awake and alert, his lungs had mild scattered rails, no wheezing, heart S1-S2 present, 3/6 systolic murmur at the right sternal border, no S3 or S4. Lower extremities with positive edema, more right than left. Sodium 135, potassium 3.5, BUN 23, creatinine 1.07, BNP 3638, white count 5.4, Hb 11,4, hematocrit 36.5, platelets 140. D-dimer negative. EKG with a sinus rhythm, poor R-wave progression. Chest film with increased vascular congestion at the hilar regions, bilateral small pleural effusions.  Patient was admitted to hospital with working diagnosis of hypoxic respiratory failure due to decompensated diastolic heart failure.  Assessment & Plan:   Active Problems:   Essential hypertension   Paroxysmal atrial fibrillation (HCC)   Severe aortic valve stenosis   Acute on chronic combined systolic and diastolic CHF, NYHA class 4 (HCC)   Central sleep apnea due to medical condition   Acute on chronic congestive heart failure (HCC)   Chest pain   Orthopnea   SOB (shortness of breath)   Slow transit constipation   Palliative care by specialist   Advance care planning  1. Heart failure decompensation. Dyastolic dysfunction, responded well to metolazone, diuresis 3800 cc up to this am. Will continue with furosemide po at 40 mg po bid to continue to keep negative fluid  balance. Not on ace inh or beta blockade due to risk of decompensation.   2, HTN.Blood pressure with systolic continue in the low 100s. Will continue gentle diuresis with po furosemide, will continue to hold ace inh due to risk of hypotension.   3. Paroxysmal atrial fibrillation.On sinus rhythm will continue telemetry monitoring and anticoagulation. Heart rate 70 to 90.    4. Severe aortic stenosis and mitral regurgitation. Patient not candidate for  Valve repair. Will continue gentle diuresis with furosemide. Refused palliative care services. Will hold on systemic steroids due to risk of worsening heart failure.    5.  Asymmetric lower extremity edema. Ultrasound negative for dvt. Will continue diuresis with furosemide.    DVT prophylaxis:pradaxa Code Status: dnr Family Communication: I spoke with patient's wife at the bedside and all questions were addressed. Disposition Plan: snf  Consultants:   Cardiology   Subjective: Patient feeling better, edema at the lower extremities, has improved, no chest pain, nausea or vomiting.    Objective: Vitals:   02/06/16 1300 02/06/16 2003 02/07/16 0643 02/07/16 1234  BP: 104/66 103/61 106/69 107/67  Pulse: 78 91 92 90  Resp: 18 17 18 18   Temp: 97.8 F (36.6 C) 98.2 F (36.8 C) 97.9 F (36.6 C) 97.4 F (36.3 C)  TempSrc: Oral Oral Oral Oral  SpO2: 100% 98% 97% 100%  Weight:   76.2 kg (168 lb 1.6 oz)   Height:        Intake/Output Summary (Last 24 hours) at 02/07/16 1309 Last data filed at 02/07/16 1235  Gross per 24 hour  Intake  1300 ml  Output             3276 ml  Net            -1976 ml   Filed Weights   02/05/16 0444 02/06/16 0359 02/07/16 0643  Weight: 78.6 kg (173 lb 3.2 oz) 78.3 kg (172 lb 9.6 oz) 76.2 kg (168 lb 1.6 oz)    Examination:  General exam: not in pain or dyspnea E ENT: mild conjunctival pallor, oral mucosa moist.   Respiratory system: Mild decreased breath sounds at bases, no wheezing,  rales or rhonchi.  Cardiovascular system: S1 & S2 heard, RRR. No JVD, rubs, gallops or clicks. ++ pitting edema. Systolic murmur 3/6 at the right sternal border.  Gastrointestinal system: Abdomen is nondistended, soft and nontender. No organomegaly or masses felt. Normal bowel sounds heard. Central nervous system: Alert and oriented. No focal neurological deficits. Extremities: Symmetric 5 x 5 power. Skin: No rashes, lesions or ulcers    Data Reviewed: I have personally reviewed following labs and imaging studies  CBC:  Recent Labs Lab 02/01/16 2227 02/03/16 2235  WBC 5.7 5.4  HGB 11.1* 11.4*  HCT 35.2* 36.5*  MCV 84.4 85.5  PLT 141* [id number]*   Basic Metabolic Panel:  Recent Labs Lab 02/01/16 2227 02/03/16 2235 02/05/16 0432 02/06/16 0404 02/07/16 0344  NA 136 135 138 139 138  K 3.8 3.5 3.5 3.9 3.3*  CL 100* 98* 101 100* 99*  CO2 27 29 29 30 29   GLUCOSE 112* 110* 101* 110* 106*  BUN 24* 23* 22* 27* 32*  CREATININE 1.15 1.07 1.04 1.07 1.12  CALCIUM 8.6* 8.8* 8.9 9.1 9.3  MG 2.2  --   --  2.1  --    GFR: Estimated Creatinine Clearance: 49.8 mL/min (by C-G formula based on SCr of 1.12 mg/dL). Liver Function Tests:  Recent Labs Lab 02/04/16 0201  AST 25  ALT 20  ALKPHOS 74  BILITOT 1.6*  PROT 5.8*  ALBUMIN 3.0*   No results for input(s): LIPASE, AMYLASE in the last 168 hours. No results for input(s): AMMONIA in the last 168 hours. Coagulation Profile: No results for input(s): INR, PROTIME in the last 168 hours. Cardiac Enzymes:  Recent Labs Lab 02/04/16 0201 02/04/16 0730 02/04/16 1300  TROPONINI 0.04* 0.04* 0.04*   BNP (last 3 results) No results for input(s): PROBNP in the last 8760 hours. HbA1C: No results for input(s): HGBA1C in the last 72 hours. CBG: No results for input(s): GLUCAP in the last 168 hours. Lipid Profile: No results for input(s): CHOL, HDL, LDLCALC, TRIG, CHOLHDL, LDLDIRECT in the last 72 hours. Thyroid Function Tests: No  results for input(s): TSH, T4TOTAL, FREET4, T3FREE, THYROIDAB in the last 72 hours. Anemia Panel: No results for input(s): VITAMINB12, FOLATE, FERRITIN, TIBC, IRON, RETICCTPCT in the last 72 hours. Sepsis Labs: No results for input(s): PROCALCITON, LATICACIDVEN in the last 168 hours.  No results found for this or any previous visit (from the past 240 hour(s)).       Radiology Studies: No results found.      Scheduled Meds: . dabigatran  150 mg Oral Q12H  . dexamethasone  1 mg Oral Daily  . furosemide  40 mg Intravenous BID  . LORazepam  0.5 mg Oral Once  . pantoprazole  40 mg Oral Daily  . potassium chloride  20 mEq Oral Daily  . senna  2 tablet Oral Daily   Continuous Infusions:    LOS: 3 days  Olivene Cookston Gerome Apley, MD [facility] Pager [phone]  If 7PM-7AM, please contact night-coverage [email or web address] Password [email or web address] 02/07/2016, 1:09 PM

## 2016-02-07 NOTE — Care Management Important Message (Signed)
Important Message  Patient Details  Name: Aaron Lucas MRN: [id number] Date of Birth: 04-Feb-1929   Medicare Important Message Given:  Yes    [name] Montine Circle 02/07/2016, 2:46 PM

## 2016-02-07 NOTE — Progress Notes (Signed)
SATURATION QUALIFICATIONS: (This note is used to comply with regulatory documentation for home oxygen)  Patient Saturations on Room Air at Rest = 96%  Patient Saturations on Room Air while Ambulating = 84-85%  Patient Saturations on 3 Liters of oxygen while Ambulating = 90-94%  Please briefly explain why patient needs home oxygen:Will need O2 with activity.  Thanks. [place] [phone] (pager)

## 2016-02-07 NOTE — Care Management Note (Signed)
Case Management Note  Patient Details  Name: Aaron Lucas MRN: [id number] Date of Birth: [birth year]  Subjective/Objective:            Admitted with CHF        Action/Plan: Patient lives at home with spouse, son lives  Close by and assist as needed. Pt has [place] services provided by [facility] but patient possibly needs a higher level of care; Physical Therapist to work with patient today to reassess his functioning level. Also patient's spouse and son want to think about Palliative Care Outpatient services at this time.  Expected Discharge Date:   possibly 02/08/2016               Expected Discharge Plan:  [place]  Discharge planning Services  CM Consult  Post Acute Care Choice:    Choice offered to:  Spouse, Adult Children  Status of Service:  In process, will continue to follow  Sherrilyn Rist [id number] 02/07/2016, 10:14 AM

## 2016-02-07 NOTE — Progress Notes (Signed)
Occupational Therapy Treatment Patient Details Name: KIEON COLETTI MRN: [id number] DOB: 1929-01-08 Today's Date: 02/07/2016    History of present illness  Pt is an 82 y.o. male with medical history significant of D-CHF, HTN, PAF, MV regurg & AS, and prostate CA. He was admitted with CC of SOB and dx of acute on chronic CHF.   OT comments  This 82 yo male admitted with above presents to acute OT with continued deficits in basic ADLs, but making progress with basic ADLs.  Follow Up Recommendations  SNF;Supervision/Assistance - 24 hour    Equipment Recommendations  None recommended by OT       Precautions / Restrictions Precautions Precautions: Fall Restrictions Weight Bearing Restrictions: No       Mobility Bed Mobility Overal bed mobility: Needs Assistance Bed Mobility: Sit to Supine       Sit to supine: Min assist   General bed mobility comments: for LEs  Transfers Overall transfer level: Needs assistance Equipment used: Rolling walker (2 wheeled) Transfers: Sit to/from Stand Sit to Stand: Min assist         General transfer comment: stand pivot transfer and then stepping sideways up to HOB min guard A    Balance Overall balance assessment: Needs assistance Sitting-balance support: No upper extremity supported;Feet supported Sitting balance-Leahy Scale: Good     Standing balance support: Bilateral upper extremity supported;During functional activity Standing balance-Leahy Scale: Poor Standing balance comment: reliant on RW in standing                   ADL Overall ADL's : Needs assistance/impaired     Grooming: Wash/dry hands;Supervision/safety;Set up;Sitting                   Toilet Transfer: Min Orthoptist Details (indicate cue type and reason): simulated recliner to bed                  Vision                 Additional Comments: blind in left eye, hx of cataract sx          Cognition    Behavior During Therapy: WFL for tasks assessed/performed Overall Cognitive Status: History of cognitive impairments - at baseline       Memory: Decreased short-term memory                            Pertinent Vitals/ Pain       Pain Assessment: No/denies pain         Frequency  Min 2X/week        Progress Toward Goals  OT Goals(current goals can now be found in the care plan section)  Progress towards OT goals: Progressing toward goals  Acute Rehab OT Goals Patient Stated Goal: home  Plan Discharge plan needs to be updated       End of Session Equipment Utilized During Treatment: Rolling walker   Activity Tolerance Patient limited by fatigue (had been up since around 9:00 am this morning)   Patient Left in bed;with call bell/phone within reach;with chair alarm set;with family/visitor present   Nurse Communication          Time: 1430-1455 OT Time Calculation (min): 25 min  Charges: OT General Charges $OT Visit: 1 Procedure OT Treatments $Self Care/Home Management : 23-37 mins  Almon Register [id number] 02/07/2016, 3:03 PM

## 2016-02-07 NOTE — Progress Notes (Signed)
PT note Full treatment note to follow.  Pt will need SNF for therapy as wife cannot provide assist level that pt needs at home.  Pt agreeable.  Wife has talked with [facility] and states they have a bed.  Thanks.  [place] [phone] (pager)

## 2016-02-07 NOTE — Progress Notes (Signed)
Orthopedic Tech Progress Note Patient Details:  Aaron Lucas 07-17-1928 [id number]  Ortho Devices Type of Ortho Device: Unna boot, Ace wrap Ortho Device/Splint Interventions: Application   [name] 02/07/2016, 10:19 AM

## 2016-02-07 NOTE — Progress Notes (Signed)
Physical Therapy Treatment Patient Details Name: Aaron Lucas MRN: [id number] DOB: 08/06/1928 Today's Date: 02/07/2016    History of Present Illness  Pt is an 82 y.o. male with medical history significant of D-CHF, HTN, PAF, MV regurg & AS, and prostate CA. He was admitted with CC of SOB and dx of acute on chronic CHF.    PT Comments    Pt admitted with above diagnosis. Pt currently with functional limitations due to balance and endurance deficits. Pt was able to ambulate with RW but needed min to mod assist for all aspects of mobility.  Wife states she cannot provide this assist at home.  Will need SNF and pt agreeable.  Also desat on RA with activity.  RT to bring incentive spirometer for pt to be educated to use.   Pt will benefit from skilled PT to increase their independence and safety with mobility to allow discharge to the venue listed below.    Follow Up Recommendations  Supervision/Assistance - 24 hour;SNF     Equipment Recommendations  None recommended by PT , O2   Recommendations for Other Services       Precautions / Restrictions Precautions Precautions: Fall Restrictions Weight Bearing Restrictions: No    Mobility  Bed Mobility               General bed mobility comments: Pt received sitting in chair.  Noted that chair pad  and pt gown soaked with urine.  Cleaned pt and changed gown as well as pt toileted again with urinal.  Washed pt as well.    Transfers Overall transfer level: Needs assistance Equipment used: Rolling walker (2 wheeled) Transfers: Sit to/from Stand Sit to Stand: Min assist;Mod assist         General transfer comment: verbal cues for hand placement. Assist to stabilize initial standing balance as pt lost balance posteriorly. After multi sit to stand transfers, pt progressed to min guard assist level.  Ambulation/Gait Ambulation/Gait assistance: Min assist;Min guard Ambulation Distance (Feet): 125 Feet Assistive device: Rolling  walker (2 wheeled) Gait Pattern/deviations: Step-through pattern;Decreased stride length;Trunk flexed;Drifts right/left;Leaning posteriorly;Shuffle Gait velocity: decreased Gait velocity interpretation: Below normal speed for age/gender General Gait Details: Slight shuffle gait. Needed cues and assist with turns as pt did not stay close to RW and inside RW.  Needed assist to maneuver RW as well.  O2 sats dropping to 84-85% on RA with activity.  Nurse aware and RT to get pt incentive spirometer. On RA, Sats 96%.   Stairs            Wheelchair Mobility    Modified Rankin (Stroke Patients Only)       Balance Overall balance assessment: Needs assistance         Standing balance support: Bilateral upper extremity supported;During functional activity Standing balance-Leahy Scale: Poor Standing balance comment: RW needed for support as pt with posterior lean in constant need of steadying assist.                     Cognition Arousal/Alertness: Awake/alert Behavior During Therapy: WFL for tasks assessed/performed Overall Cognitive Status: History of cognitive impairments - at baseline       Memory: Decreased short-term memory              Exercises      General Comments General comments (skin integrity, edema, etc.): Wife reports she cannot provide level of assist pt needs at home.       Pertinent  Vitals/Pain Pain Assessment: No/denies pain    SATURATION QUALIFICATIONS: (This note is used to comply with regulatory documentation for home oxygen)  Patient Saturations on Room Air at Rest = 96%  Patient Saturations on Room Air while Ambulating = 84-85%  Patient Saturations on 3 Liters of oxygen while Ambulating = 90-94%  Please briefly explain why patient needs home oxygen:Will need O2 with activity.    Home Living                      Prior Function            PT Goals (current goals can now be found in the care plan section) Acute  Rehab PT Goals Patient Stated Goal: home Progress towards PT goals: Progressing toward goals    Frequency    Min 3X/week      PT Plan Discharge plan needs to be updated    Co-evaluation             End of Session Equipment Utilized During Treatment: Gait belt;Oxygen (needs O2 with ambulation) Activity Tolerance: Patient limited by fatigue Patient left: in chair;with chair alarm set;with call bell/phone within reach;with family/visitor present     Time: [id number] PT Time Calculation (min) (ACUTE ONLY): 38 min  Charges:  $Gait Training: 8-22 mins $Self Care/Home Management: 8-22                    G CodesDenice Paradise 2016-02-12, 2:27 PM Newt Levingston,PT Acute Rehabilitation [phone] (pager)

## 2016-02-08 LAB — BASIC METABOLIC PANEL
Anion gap: 11 (ref 5–15)
BUN: 29 mg/dL — AB (ref 6–20)
CALCIUM: 9.1 mg/dL (ref 8.9–10.3)
CO2: 32 mmol/L (ref 22–32)
CREATININE: 0.92 mg/dL (ref 0.61–1.24)
Chloride: 96 mmol/L — ABNORMAL LOW (ref 101–111)
GFR calc non Af Amer: >60 mL/min (ref >60)
GLUCOSE: 98 mg/dL (ref 65–99)
Potassium: 3.1 mmol/L — ABNORMAL LOW (ref 3.5–5.1)
Sodium: 139 mmol/L (ref 135–145)

## 2016-02-08 MED ORDER — POTASSIUM CHLORIDE CRYS ER 20 MEQ PO TBCR: 20.0000 meq | EXTENDED_RELEASE_TABLET | Freq: Every day | ORAL | 0 refills | Status: AC

## 2016-02-08 MED ORDER — FUROSEMIDE 40 MG PO TABS: 40.0000 mg | ORAL_TABLET | Freq: Two times a day (BID) | ORAL | 0 refills | Status: DC

## 2016-02-08 NOTE — Progress Notes (Signed)
Patient will be discharge to [facility]. Report given.

## 2016-02-08 NOTE — Clinical Social Work Note (Signed)
Clinical Social Work Assessment  Patient Details  Name: Aaron Lucas MRN: [id number] Date of Birth: 1928/11/17  Date of referral:  02/08/16               Reason for consult:  Facility Placement                Permission sought to share information with:  Facility Sport and exercise psychologist, Family Supports Permission granted to share information::  Yes, Verbal Permission Granted      Housing/Transportation Living arrangements for the past 2 months:  [place] of Information:  Patient, Spouse Patient Interpreter Needed:  None Criminal Activity/Legal Involvement Pertinent to Current Situation/Hospitalization:  No - Comment as needed Significant Relationships:  Spouse Lives with:  Spouse Do you feel safe going back to the place where you live?  Yes (After rehab) Need for family participation in patient care:     Care giving concerns:  Lives at home with his wife; still has weakness and wife is unable to manage his care at this time. Wants short term rehab.   Social Worker assessment / plan:  CSW notified today that patient was medically stable for d/c to SNF today per MD and d/c order in place. CSW spoke with wife who indicated that she has already been in contact with [facility] staff and that a bed is available. This is only SNF choice. Fl2 and [place] completed and faxed to [facility] for review.  Bed offer given and ok for d/c to SNF today.   Employment status:  Retired Nurse, adult PT Recommendations:  [place] / Referral to community resources:  [place]  Patient/Family's Response to care: Patient noted to be very friendly and joking with CSW. States he is feeling much better and is looking forward to returning home.   Patient/Family's Understanding of and Emotional Response to Diagnosis, Current Treatment, and Prognosis:  Patient and wife both appeared to have a good understanding of his current  diagnosis, treatment and prognosis. Wife wants him to be stronger before he returns home.    Emotional Assessment Appearance:  Appears stated age, Well-Groomed Attitude/Demeanor/Rapport:   (rational, relaxed, cooperative, friendly) Affect (typically observed):  Pleasant, Happy, Appropriate, Calm Orientation:  Oriented to Place, Oriented to  Time, Oriented to Self, Oriented to Situation Alcohol / Substance use:  Alcohol Use (social) Psych involvement (Current and /or in the community):  No (Comment)  Discharge Needs  Concerns to be addressed:  No discharge needs identified Readmission within the last 30 days:  No Current discharge risk:  Dependent with Mobility Barriers to Discharge:  No Barriers Identified   Aaron Lucas 02/08/2016, 9:49 PM

## 2016-02-08 NOTE — Discharge Summary (Addendum)
Physician Discharge Summary  Aaron Lucas [id number] DOB: April 30, 1928 DOA: 02/03/2016  PCP: Geoffery Lyons, MD  Admit date: 02/03/2016 Discharge date: 02/08/2016  Admitted From:  Home  Disposition:  SNF   Recommendations for Outpatient Follow-up:  1. Follow up with PCP in 1-weeks 2. Please obtain BMP in one week.   Home Health: NA  Equipment/Devices: NA    Discharge Condition: Stable  CODE STATUS: DNR   Diet recommendation: Heart Healthy   Brief/Interim Summary: This is an 82 year old male who presented to the hospital with a chief complaint of dyspnea. At home patient had been developing worsening shortness of breath, associated with lower extremity edema. Patient had recurrent exacerbations of his heart failure, follows outpatient by cardiology. He does have valvular heart disease with aortic stenosis and mitral regurgitation. On initial physical examination his blood pressure was 90/60, heart rate 87, oxygen saturation 86% on room air, afebrile. He was awake and alert, his lungs had mild scattered rales, no wheezing, heart S1-S2 present, 3/6 systolic murmur at the right sternal border, no S3 or S4. Lower extremities with positive edema, more right than left. Sodium 135, potassium 3.5, BUN 23, creatinine 1.07, BNP 3638, white count 5.4, Hb 11,4, hematocrit 36.5, platelets 140. D-dimer negative. EKG with a sinus rhythm, poor R-wave progression. Chest film with increased vascular congestion at the hilar regions, bilateral small pleural effusions.  Patient was admitted to hospital with working diagnosis of acute hypoxic respiratory failure due to decompensated diastolic heart failure.  1. Decompensated diastolic heart failure, acute on chronic. Patient was admitted to the telemetry unit, he was diuresed with furosemide and metolazone, he achieved  negative fluid balance. He had a significant improvement of symptoms and edema. Patient will be resumed on his lisinopril at the time of  discharge, no beta blockade yet due to  risk of hemodynamic decompensation. His heart failure decompensation was presumed to be related to his aortic stenosis. His furosemide has been increased to 40 mg twice daily  2. Hypertension. Patient will resume his lisinopril, furosemide has been increased to 40 mg twice daily. If patient remained hemodynamically stable recommend beta-blockade as part of heart failure regimen.  3. Valvular heart disease. Echocardiogram from May 0000000 showed systolic LV function between 45 and [id number], grade 3 diastolic dysfunction, severe aortic stenosis and severe mitral regurgitation. Considering symptomatic aortic stenosis patient's prognosis is poor, palliative care services were consulted. Patient's family refused further services from palliative.  4. Paroxysmal fibrillation. Patient remained sinus rhythm on telemetry monitor. Anticoagulation was continue with Pradaxa.  5. Asymmetric lower extremity edema. Ultrasonography of the lower extremities was negative for DVT.  Discharge Diagnoses:  Active Problems:   Essential hypertension   Paroxysmal atrial fibrillation (HCC)   Severe aortic valve stenosis   Acute on chronic combined systolic and diastolic CHF, NYHA class 4 (HCC)   Central sleep apnea due to medical condition   Acute on chronic congestive heart failure (HCC)   Chest pain   Orthopnea   SOB (shortness of breath)   Slow transit constipation   Palliative care by specialist   Advance care planning    Discharge Instructions  Discharge Instructions    Diet - low sodium heart healthy    Complete by:  As directed    Discharge instructions    Complete by:  As directed    Please follow with primary care in 7 days.   Increase activity slowly    Complete by:  As directed  Medication List    STOP taking these medications   clindamycin 1 % lotion Commonly known as:  CLEOCIN T   FISH OIL PO   metroNIDAZOLE 0.75 % cream Commonly known as:   METROCREAM   NASACORT ALLERGY 24HR 55 MCG/ACT Aero nasal inhaler Generic drug:  triamcinolone   vitamin C 500 MG tablet Commonly known as:  ASCORBIC ACID     TAKE these medications   ALPRAZolam 0.25 MG tablet Commonly known as:  XANAX Take 1 tablet (0.25 mg total) by mouth at bedtime as needed for anxiety.   dabigatran 150 MG Caps capsule Commonly known as:  PRADAXA Take 1 capsule (150 mg total) by mouth every 12 (twelve) hours.   furosemide 40 MG tablet Commonly known as:  LASIX Take 1 tablet (40 mg total) by mouth 2 (two) times daily. What changed:  when to take this   lisinopril 2.5 MG tablet Commonly known as:  PRINIVIL Take 1 tablet (2.5 mg total) by mouth daily.   loratadine 10 MG tablet Commonly known as:  CLARITIN Take 10 mg by mouth daily as needed for allergies.   multivitamin with minerals tablet Take 1 tablet by mouth daily.   omeprazole 40 MG capsule Commonly known as:  PRILOSEC Take 40 mg by mouth daily.   potassium chloride SA 20 MEQ tablet Commonly known as:  K-DUR,KLOR-CON Take 1 tablet (20 mEq total) by mouth daily. Start taking on:  02/09/2016      Follow-up Information    [name],[name], MD Follow up in 1 week(s).   Specialty:  Internal Medicine Contact information: [place] [address] [phone]          Allergies  Allergen Reactions  . Ambien [Zolpidem Tartrate] Other (See Comments)    Per patient and wife "hallucinations and sleep walking" (tried to climb out of the windows in the middle of the night)  . Diamox [Acetazolamide] Other (See Comments)    UNSPECIFIED   . Other Other (See Comments)    Nuts, pecans only--unknown  . Penicillins Swelling and Rash    SWELLING REACTION UNSPECIFIED  Has patient had a PCN reaction causing immediate rash, facial/tongue/throat swelling, SOB or lightheadedness with hypotension: unknown Has patient had a PCN reaction causing severe rash involving mucus membranes or skin  necrosis: No Has patient had a PCN reaction that required hospitalization No Has patient had a PCN reaction occurring within the last 10 years: No    . Ciprofloxacin Rash  . Doxycycline Rash  . Erythromycin Rash  . Neptazane [Methazolamide] Other (See Comments)    UNSPECIFIED     Consultations:  Cardiology    Procedures/Studies: Dg Chest 1 View  Result Date: 01/17/2016 CLINICAL DATA:  Increased shortness of breath for several months, today worsening. EXAM: CHEST 1 VIEW COMPARISON:  12/08/2015 FINDINGS: Shallow inspiration. Cardiac enlargement without vascular congestion. No focal consolidation or edema. No blunting of costophrenic angles. No pneumothorax. Old left rib fractures old fracture deformity left clavicle. Degenerative changes in the spine and shoulders. Calcification of the aorta. IMPRESSION: Cardiac enlargement.  No evidence of active pulmonary disease. Electronically Signed   By: Lucienne Capers M.D.   On: 01/17/2016 22:42   Dg Chest 2 View  Result Date: 02/03/2016 CLINICAL DATA:  Shortness of breath tonight. History of hypertension, recurrent bladder and prostate cancer, and heart disease. EXAM: CHEST  2 VIEW COMPARISON:  Chest radiograph February 01, 2016 FINDINGS: Cardiac silhouette is mildly enlarged and unchanged. Calcified aortic knob. Similar LEFT  and increasing RIGHT small pleural effusions with bibasilar atelectasis. Strandy densities LEFT lung base. Pulmonary vascular congestion and mild interstitial prominence. No pneumothorax. Old LEFT posterolateral rib fractures. IMPRESSION: CHF with increasing small pleural effusions. LEFT lung base atelectasis versus confluent edema, less likely pneumonia. Electronically Signed   By: Elon Alas M.D.   On: 02/03/2016 23:19   Dg Chest 2 View  Result Date: 02/01/2016 CLINICAL DATA:  Chest fluttering sensation. EXAM: CHEST  2 VIEW COMPARISON:  01/17/2016 FINDINGS: There is borderline cardiomegaly with mild CHF and small  pleural effusions. Superimposed infiltrates at the lung bases are not entirely excluded. Old left-sided third through ninth rib fractures. Mitral annular calcifications are present. No acute osseous abnormality. IMPRESSION: Borderline cardiomegaly with findings consistent with mild CHF and small pleural effusions. Superimposed pneumonic airspace disease at the lung bases is not entirely excluded. Electronically Signed   By: Ashley Royalty M.D.   On: 02/01/2016 23:52       Subjective: Patient feeling well, no nausea or vomiting, out of bed to chair, no dyspnea or chest pain.   Discharge Exam: Vitals:   02/07/16 1234 02/07/16 2000  BP: 107/67 110/70  Pulse: 90 89  Resp: 18 18  Temp: 97.4 F (36.3 C) 97.3 F (36.3 C)   Vitals:   02/07/16 0643 02/07/16 1234 02/07/16 2000 02/08/16 0717  BP: 106/69 107/67 110/70   Pulse: 92 90 89   Resp: 18 18 18    Temp: 97.9 F (36.6 C) 97.4 F (36.3 C) 97.3 F (36.3 C)   TempSrc: Oral Oral Oral   SpO2: 97% 100% 100%   Weight: 76.2 kg (168 lb 1.6 oz)   73 kg (161 lb)  Height:        General: Pt is alert, awake, not in acute distress Cardiovascular: RRR, S1/S2 +, no rubs, no gallops, positive murmur 3/6 at the right sternal border and at the apex.  Respiratory: CTA bilaterally, no wheezing, no rhonchi Abdominal: Soft, NT, ND, bowel sounds + Extremities: Trace lower extremity edema, no cyanosis    The results of significant diagnostics from this hospitalization (including imaging, microbiology, ancillary and laboratory) are listed below for reference.     Microbiology: No results found for this or any previous visit (from the past 240 hour(s)).   Labs: BNP (last 3 results)  Recent Labs  11/25/15 1816 02/01/16 2227 02/03/16 2235  BNP 1,831.5* 2,743.2* 99991111*   Basic Metabolic Panel:  Recent Labs Lab 02/01/16 2227 02/03/16 2235 02/05/16 0432 02/06/16 0404 02/07/16 0344 02/08/16 0303  NA 136 135 138 139 138 139  K 3.8 3.5 3.5  3.9 3.3* 3.1*  CL 100* 98* 101 100* 99* 96*  CO2 27 29 29 30 29  32  GLUCOSE 112* 110* 101* 110* 106* 98  BUN 24* 23* 22* 27* 32* 29*  CREATININE 1.15 1.07 1.04 1.07 1.12 0.92  CALCIUM 8.6* 8.8* 8.9 9.1 9.3 9.1  MG 2.2  --   --  2.1  --   --    Liver Function Tests:  Recent Labs Lab 02/04/16 0201  AST 25  ALT 20  ALKPHOS 74  BILITOT 1.6*  PROT 5.8*  ALBUMIN 3.0*   No results for input(s): LIPASE, AMYLASE in the last 168 hours. No results for input(s): AMMONIA in the last 168 hours. CBC:  Recent Labs Lab 02/01/16 2227 02/03/16 2235  WBC 5.7 5.4  HGB 11.1* 11.4*  HCT 35.2* 36.5*  MCV 84.4 85.5  PLT 141* 140*   Cardiac Enzymes:  Recent Labs Lab 02/04/16 0201 02/04/16 0730 02/04/16 1300  TROPONINI 0.04* 0.04* 0.04*   BNP: Invalid input(s): POCBNP CBG: No results for input(s): GLUCAP in the last 168 hours. D-Dimer No results for input(s): DDIMER in the last 72 hours. Hgb A1c No results for input(s): HGBA1C in the last 72 hours. Lipid Profile No results for input(s): CHOL, HDL, LDLCALC, TRIG, CHOLHDL, LDLDIRECT in the last 72 hours. Thyroid function studies No results for input(s): TSH, T4TOTAL, T3FREE, THYROIDAB in the last 72 hours.  Invalid input(s): FREET3 Anemia work up No results for input(s): VITAMINB12, FOLATE, FERRITIN, TIBC, IRON, RETICCTPCT in the last 72 hours. Urinalysis    Component Value Date/Time   COLORURINE YELLOW 11/30/2015 1706   APPEARANCEUR CLEAR 11/30/2015 1706   LABSPEC 1.012 11/30/2015 1706   PHURINE 6.0 11/30/2015 1706   GLUCOSEU NEGATIVE 11/30/2015 1706   HGBUR SMALL (A) 11/30/2015 1706   BILIRUBINUR NEGATIVE 11/30/2015 1706   KETONESUR NEGATIVE 11/30/2015 1706   PROTEINUR NEGATIVE 11/30/2015 1706   UROBILINOGEN 0.2 10/29/2010 1613   NITRITE NEGATIVE 11/30/2015 1706   LEUKOCYTESUR NEGATIVE 11/30/2015 1706   Sepsis Labs Invalid input(s): PROCALCITONIN,  WBC,  LACTICIDVEN Microbiology No results found for this or any  previous visit (from the past 240 hour(s)).   Time coordinating discharge: 45 minutes  SIGNED:   Tawni Millers, MD  [facility] 02/08/2016, 10:37 AM Pager   If 7PM-7AM, please contact night-coverage [email or web address] Password [email or web address]

## 2016-02-08 NOTE — NC FL2 (Signed)
[place] LEVEL OF CARE SCREENING TOOL     IDENTIFICATION  Patient Name: Aaron Lucas Birthdate: [birth year] Sex: male Admission Date (Current Location): 02/03/2016  [facility] and [place] Number:  Herbalist and Address:  [facility], [place] [address], [place], [place] [address]      Provider Number: [id number]  Attending Physician Name and Address:  Tawni Millers, *  Relative Name and Phone Number:       Current Level of Care: Hospital Recommended Level of Care: [place] Prior Approval Number:    Date Approved/Denied:   PASRR Number: [id number]  Discharge Plan: SNF    Current Diagnoses: Patient Active Problem List   Diagnosis Date Noted  . Orthopnea   . SOB (shortness of breath)   . Slow transit constipation   . Palliative care by specialist   . Advance care planning   . Acute on chronic congestive heart failure ([place]) 02/04/2016  . Chest pain 02/04/2016  . Central sleep apnea due to medical condition 01/22/2016  . Insomnia w/ sleep apnea 01/22/2016  . Aortic stenosis, moderate 01/22/2016  . CHF (congestive heart failure) ([place]) 11/28/2015  . Acute on chronic combined systolic and diastolic CHF, NYHA class 4 ([place]) 11/27/2015  . Preoperative clearance 11/25/2015  . Acute on chronic diastolic heart failure ([place]) 11/25/2015  . Hernia of abdominal cavity   . Left inguinal hernia 11/24/2015  . Hyponatremia 11/24/2015  . Abnormal urinalysis 11/24/2015  . Severe aortic valve stenosis   . Aortic stenosis 03/15/2015  . Lower GI bleeding 11/13/2014  . Melena 11/13/2014  . Essential hypertension   . Paroxysmal atrial fibrillation (HCC)   . Mitral regurgitation     Orientation RESPIRATION BLADDER Height & Weight     Self, Place  Normal, O2 (02  needed with activity) Continent Weight: 161 lb (73 kg) Height:  5\' 10"  (177.8 cm)  BEHAVIORAL SYMPTOMS/MOOD NEUROLOGICAL BOWEL NUTRITION STATUS   Continent Diet (Heart Healthy)  AMBULATORY STATUS COMMUNICATION OF NEEDS Skin   Limited Assist Verbally Other (Comment) (Cellulitis/ecchymosis R. Leg and L arm)                       Personal Care Assistance Level of Assistance  Bathing, Dressing Bathing Assistance: Limited assistance   Dressing Assistance: Limited assistance     Functional Limitations Info    Sight Info: Impaired (has had a detached retina in the past)        [place]  PT (By licensed PT), OT (By licensed OT)     PT Frequency: 5 OT Frequency: 5            Contractures Contractures Info: Not present    Additional Factors Info  Code Status, Allergies Code Status Info: DNR Allergies Info: Ambien, Zolpidem Tartram,daimox, other, PCNS Ciprofloxin, Doxycylcine, Erthyromycin, Neptazane           Current Medications (02/08/2016):  This is the current hospital active medication list Current Facility-Administered Medications  Medication Dose Route Frequency Provider Last Rate Last Dose  . acetaminophen (TYLENOL) tablet 650 mg  650 mg Oral Q4H PRN Toy Baker, MD      . ALPRAZolam Duanne Moron) tablet 0.25 mg  0.25 mg Oral BID PRN Toy Baker, MD   0.25 mg at 02/07/16 2138  . dabigatran (PRADAXA) capsule 150 mg  150 mg Oral Q12H Toy Baker, MD   150 mg at 02/08/16 1003  . furosemide (  LASIX) tablet 40 mg  40 mg Oral BID [place] Gerome Apley, MD   40 mg at 02/08/16 0850  . loratadine (CLARITIN) tablet 10 mg  10 mg Oral Daily PRN Toy Baker, MD      . LORazepam (ATIVAN) tablet 0.5 mg  0.5 mg Oral Once Gardiner Barefoot, NP      . ondansetron (ZOFRAN) injection 4 mg  4 mg Intravenous Q6H PRN Toy Baker, MD      . pantoprazole (PROTONIX) EC tablet 40 mg  40 mg Oral Daily Toy Baker, MD   40 mg at 02/08/16 1003  . potassium chloride SA (K-DUR,KLOR-CON) CR tablet 20 mEq  20 mEq Oral Daily Toy Baker, MD   20 mEq at 02/08/16 1003  .  senna (SENOKOT) tablet 17.2 mg  2 tablet Oral Daily Earlie Counts, NP   17.2 mg at 02/08/16 1003  . triamcinolone (NASACORT) nasal inhaler 1 spray  1 spray Each Nare Daily PRN Toy Baker, MD         Discharge Medications: Please see discharge summary for a list of discharge medications.  Relevant Imaging Results:  Relevant Lab Results:   Additional Information Lorie Phenix. Pauline Good, [place], [phone] (weekend coverage)  [place] Area, LCSW

## 2016-02-08 NOTE — Clinical Social Work Placement (Signed)
   CLINICAL SOCIAL WORK PLACEMENT  NOTE  Date:  02/08/2016  Patient Details  Name: Aaron Lucas MRN: [id number] Date of Birth: 1929-02-08  Clinical Social Work is seeking post-discharge placement for this patient at the [place] level of care (*CSW will initial, date and re-position this form in  chart as items are completed):  No (Wife already picked [facility] and confirmed there is a bed there)   Patient/family provided with [place] Work Department's list of facilities offering this level of care within the geographic area requested by the patient (or if unable, by the patient's family).  Yes   Patient/family informed of their freedom to choose among providers that offer the needed level of care, that participate in Medicare, Medicaid or managed care program needed by the patient, have an available bed and are willing to accept the patient.  No (NA  wife chose [facility])   Patient/family informed of [place]'s ownership interest in [facility] and [facility], as well as of the fact that they are under no obligation to receive care at these facilities.  PASRR submitted to EDS on 02/08/16     PASRR number received on 02/08/16     Existing PASRR number confirmed on       FL2 transmitted to all facilities in geographic area requested by pt/family on (S) 02/08/16 (sent to [facility] only)     [place] transmitted to all facilities within larger geographic area on       Patient informed that his/her managed care company has contracts with or will negotiate with certain facilities, including the following:   [facility])     Yes   Patient/family informed of bed offers received.  Patient chooses bed at [facility] recommends and patient chooses bed at      Patient to be transferred to [facility] [place] on 02/08/16.  Patient to be transferred to facility by ambulance  [facility])     Patient family  notified on 02/01/16 of transfer.  Name of family member notified:  Wife:  Gregary Signs     PHYSICIAN Please prepare priority discharge summary, including medications, Please sign FL2, Please prepare prescriptions     Additional Comment: OK per MD for d/c today to SNF. Patient and wife pleased with bed offer from [facility].  RN to call report to facility and d/c summary sent to facility for review.  No further SW needs identified.. SW signing off.     _______________________________________________ Williemae Area, LCSW 02/08/2016, 4:06 PM

## 2016-02-08 NOTE — Progress Notes (Signed)
CM received notification from MD pt to be discharged today to SNF.  CM placed call to [place] [phone] of planned discharge and placed CSW consult.  No other CM needs were communicated.

## 2016-02-08 NOTE — Progress Notes (Signed)
Patient discharge to SNIF [facility]) accompanied by two PTAR personnel and patient spouse via stretcher. Report given prior to discharge. Patient stable no signs and symptoms of distress noted. All personal belongings given. Telemetry box and IV removed prior to discharge and site in good condition.

## 2016-02-12 NOTE — Telephone Encounter (Signed)
I spoke to pt's wife, Gregary Signs, per [facility] and advised her that the order for a cpap titration was placed and our sleep lab will call them to schedule it. Pt's wife is asking that [facility] call [phone] when ready to schedule the sleep study. I advised pt's wife that I would let our sleep lab know that.

## 2016-02-12 NOTE — Telephone Encounter (Addendum)
Wife called to schedule in lab cpap titration study as recommended by Dr. Brett Fairy. Please call [phone] or [phone].

## 2016-02-12 NOTE — Telephone Encounter (Signed)
I spoke to pt's wife, Gregary Signs, per [facility]. Pt is in a nursing home at this time for rehab. I advised her that pt's sleep study revealed moderate to severe OSA with an AHI of 38.3, brady and tachycardia and prolonged hypoxemia. Pt had 107 minutes with oxygen under 85% which indicates severe hypoxemia. Dr. Brett Fairy recommends that pt return for an in lab cpap titration study so that the option to give additional oxygen if needed is available. Pt's wife says that she will need to discuss this first with the pt and she will call us back to discuss if they are agreeable to the cpap titration study. Pt's wife verbalized understanding of results. Pt's wife had no questions at this time but was encouraged to call back if questions arise.

## 2016-02-12 NOTE — Addendum Note (Signed)
Addended by: Lester Belvidere A on: 02/12/2016 02:13 PM   Modules accepted: Orders

## 2016-02-13 NOTE — Progress Notes (Signed)
HPI The patient presents for follow of MR, AS and atrial fibrillation.   He was in the emergency room in January with volume overload.  Following a followup appointment I sent him to see if he could be considered for TAVR.   He was thoroughly evaluated by doctors Angelena Form and [place].  Ultimately it was thought that he would not be a good TAVR candidate because of mitral regurgitation. He had a TEE demonstrating his mitral regurgitation was severe. His mean gradient across his aortic valves was 42.   He was in the hospital most recently late last month with volume overload.   Palliative care was consulted but the patient did not initially want these services.   He is at [employer].  He has had increased swelling and it looks like a 10 lb weight gain.  He seems to minimize his symptoms and says he is not having increasing shortness of breath. I do see from notes that he's been hypotensive and so his ACE inhibitor was stopped. His Lasix was reduced from 40 twice a day to 20 mg twice a day. He's doing some physical therapy. He walks with a walker and says this is going okay   Allergies  Allergen Reactions  . Ambien [Zolpidem Tartrate] Other (See Comments)    Per patient and wife "hallucinations and sleep walking" (tried to climb out of the windows in the middle of the night)  . Diamox [Acetazolamide] Other (See Comments)    UNSPECIFIED   . Other Other (See Comments)    Nuts, pecans only--unknown  . Penicillins Swelling and Rash    SWELLING REACTION UNSPECIFIED  Has patient had a PCN reaction causing immediate rash, facial/tongue/throat swelling, SOB or lightheadedness with hypotension: unknown Has patient had a PCN reaction causing severe rash involving mucus membranes or skin necrosis: No Has patient had a PCN reaction that required hospitalization No Has patient had a PCN reaction occurring within the last 10 years: No    . Ciprofloxacin Rash  . Doxycycline Rash  . Erythromycin Rash  .  Neptazane [Methazolamide] Other (See Comments)    UNSPECIFIED     Current Outpatient Prescriptions  Medication Sig Dispense Refill  . ALPRAZolam (XANAX) 0.25 MG tablet Take 1 tablet (0.25 mg total) by mouth at bedtime as needed for anxiety. 30 tablet 0  . dabigatran (PRADAXA) 150 MG CAPS capsule Take 1 capsule (150 mg total) by mouth every 12 (twelve) hours. 180 capsule 3  . furosemide (LASIX) 20 MG tablet Take 20 mg by mouth 2 (two) times daily.    Marland Kitchen loratadine (CLARITIN) 10 MG tablet Take 10 mg by mouth daily as needed for allergies.     . Multiple Vitamins-Minerals (MULTIVITAMIN WITH MINERALS) tablet Take 1 tablet by mouth daily.    Marland Kitchen omeprazole (PRILOSEC) 40 MG capsule Take 40 mg by mouth daily.  3  . potassium chloride SA (K-DUR,KLOR-CON) 20 MEQ tablet Take 1 tablet (20 mEq total) by mouth daily. 30 tablet 0   No current facility-administered medications for this visit.     Past Medical History:  Diagnosis Date  . Cataracts, bilateral   . Chronic diastolic CHF (congestive heart failure) ([place]) 11/25/2015  . Detached retina Twice   As a result he is lost vision in the left eye.  . Diverticulosis 2006  . Hx of colonic polyps 2006,  2011  . Hypertension   . Paroxysmal atrial fibrillation (HCC)    On Pradaxa.   . Prostate cancer [facility]) 2004  seed implant  . 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 Valvular heart disease    MV regurge and Ao stenosis  . Vertigo     Past Surgical History:  Procedure Laterality Date  . CARDIAC CATHETERIZATION N/A 04/19/2015   Procedure: Right/Left Heart Cath and Coronary Angiography;  Surgeon: Burnell Blanks, MD;  Location: [place] [facility];  Service: Cardiovascular;  Laterality: N/A;  . CATARACT EXTRACTION    . COLONOSCOPY  2006 and 11/2009   tubular adenomas on both studies. int rrhoids, diverticulosis.   . COLONOSCOPY N/A 11/15/2014   Procedure: COLONOSCOPY;  Surgeon: Milus Banister, MD;  Location: [place];   Service: Endoscopy;  Laterality: N/A;  . 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 Focal urothelial atypia in 2006.  Marland Kitchen ESOPHAGOGASTRODUODENOSCOPY N/A 11/15/2014   Procedure: ESOPHAGOGASTRODUODENOSCOPY (EGD);  Surgeon: Milus Banister, MD;  Location: [place];  Service: Endoscopy;  Laterality: N/A;  . INGUINAL HERNIA REPAIR Bilateral 11/28/2015   Procedure: BILATERAL INGUINAL HERNIA REPAIR;  Surgeon: Erroll Luna, MD;  Location: [place];  Service: General;  Laterality: Bilateral;  . INSERTION OF MESH Bilateral 11/28/2015   Procedure: INSERTION OF MESH;  Surgeon: Erroll Luna, MD;  Location: [place];  Service: General;  Laterality: Bilateral;  . PLEURAL EFFUSION DRAINAGE  2010  . RADIOACTIVE SEED IMPLANT  2004   For treatment of prostate cancer  . SQUAMOUS CELL CARCINOMA EXCISION  2002, 2003, 2006.  . TEE WITHOUT CARDIOVERSION N/A 05/14/2015   Procedure: TRANSESOPHAGEAL ECHOCARDIOGRAM (TEE);  Surgeon: Larey Dresser, MD;  Location: [place];  Service: Cardiovascular;  Laterality: N/A;  . THORACENTESIS    . TONSILLECTOMY      ROS:    As stated in the HPI and negative for all other systems.  PHYSICAL EXAM BP 94/60   Pulse 85   Ht 5\' 10"  (1.778 m)   Wt 173 lb (78.5 kg)   BMI 24.82 kg/m  GENERAL:  Very frail appearing HEENT:  Fundi not visualized, oral mucosa unremarkable, disconjugate gaze with anisocoria.   NECK:  No jugular venous distention at 90, waveform within normal limits, carotid upstroke brisk and symmetric, no bruits, no thyromegaly LUNGS:  Clear to auscultation bilaterally CHEST:  Unremarkable HEART:  PMI not displaced or sustained,S1 and S2 within normal limits, no S3, no S4, no clicks, no rubs, apical holosystolic murmur and murmur radiating out the aortic outflow tract. ABD:  Flat, positive bowel sounds normal in frequency in pitch, no bruits, no rebound, no guarding. EXT:  2 plus pulses  throughout, moderate to severe bilateral left edema, no cyanosis no clubbing   ASSESSMENT AND PLAN  Paroxysmal atrial fibrillation -  He has had no symptomatic paroxysms. He tolerates the Pradaxa and he will continue with this unless he has recurrent GI bleeding.    Mr. Aaron Lucas has a CHA2DS2 - VASc score of 3 with a risk of stroke of 3.2%.  Mitral regurgitation This is moderately severe regurgitation and we will follow this clinically.   Aortic stenosis -  He is not a TAVR candidate and would be too high risk for open valve surgery.    Hypertension -  His blood pressure is running low necessitating reduction of his meds.  Acute on chronic systolic and diastolic heart failure - I spoke with his physician at the nursing home. They've kept excellent eye on him. I would like for  them to increase his Lasix to 40 mg twice a day but patient has to keep his legs up and we will write for thigh-high compression stockings. He should come back in 2 weeks for follow-up.

## 2016-02-14 ENCOUNTER — Ambulatory Visit (INDEPENDENT_AMBULATORY_CARE_PROVIDER_SITE_OTHER): Payer: Medicare Other | Admitting: Cardiology

## 2016-02-14 ENCOUNTER — Encounter: Payer: Self-pay | Admitting: Cardiology

## 2016-02-14 VITALS — BP 94/60 | HR 85 | Ht 70.0 in | Wt 173.0 lb

## 2016-02-14 DIAGNOSIS — I5043 Acute on chronic combined systolic (congestive) and diastolic (congestive) heart failure: Secondary | ICD-10-CM

## 2016-02-14 DIAGNOSIS — R609 Edema, unspecified: Secondary | ICD-10-CM | POA: Diagnosis not present

## 2016-02-14 NOTE — Patient Instructions (Addendum)
Medication Instructions:  INCREASE- Lasix 40 mg twice a day  Labwork: None Ordered  Testing/Procedures: None Ordered  Follow-Up: Your physician recommends that you schedule a follow-up appointment in: 2 weeks with APP   Any Other Special Instructions Will Be Listed Below (If Applicable).  Rx for thigh-High compression stocking   If you need a refill on your cardiac medications before your next appointment, please call your pharmacy.

## 2016-02-28 ENCOUNTER — Ambulatory Visit: Payer: Medicare Other | Admitting: Cardiology

## 2016-03-13 DEATH — deceased

## 2018-04-14 IMAGING — CT CT ABD-PELV W/ CM
2 of 5 series · 15 of 46 positions shown, 17 images · IV contrast (Omni 300)
Comparison: 07/01/2015

CLINICAL DATA: LEFT lower quadrant abdominal pain radiating to
groin intermittently for 4-5 weeks, LEFT inguinal hernia, history
valvular heart disease, atrial fibrillation, prostate cancer,
diverticulosis, hypertension

EXAM:
CT ABDOMEN AND PELVIS WITH CONTRAST
TECHNIQUE: Multidetector CT imaging of the abdomen and pelvis was performed
using the standard protocol following bolus administration of
intravenous contrast. Sagittal and coronal MPR images reconstructed
from axial data set.
CONTRAST:  100mL [id number] IOPAMIDOL ([id number]) INJECTION 61% IV.
Dilute oral contrast.

[Series 2: a/p w/ 5mm · axial · 0.85mm/px · z∈[-764,-344]mm · 12 of 94 slices shown, 14 images]
[im 5/94  soft-tissue]
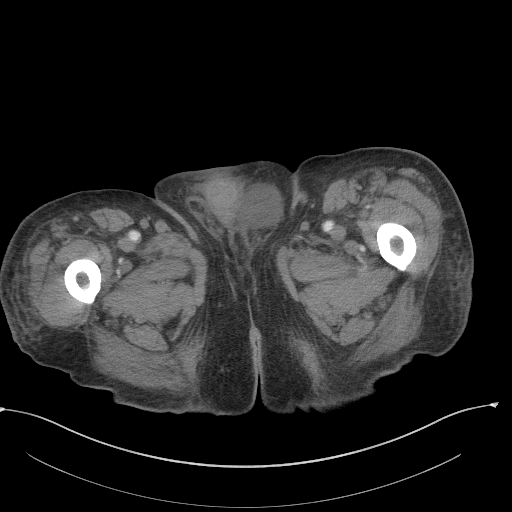
[im 5/94  bone]
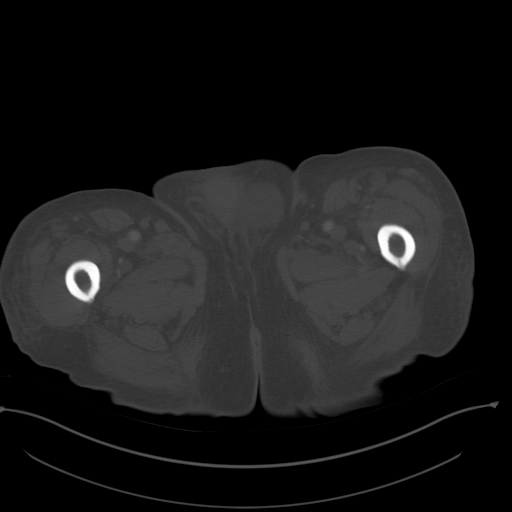
[im 15/94  soft-tissue]
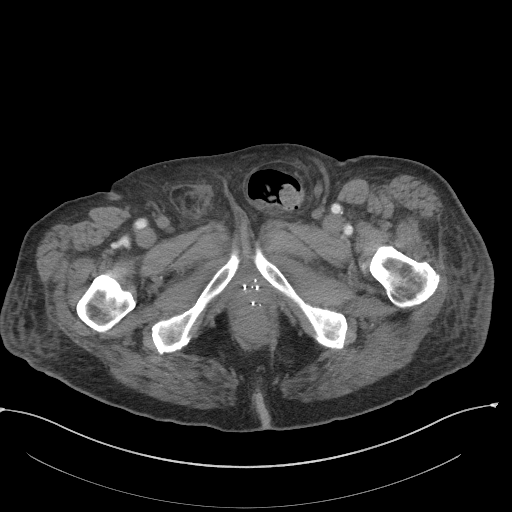
[im 20/94  soft-tissue]
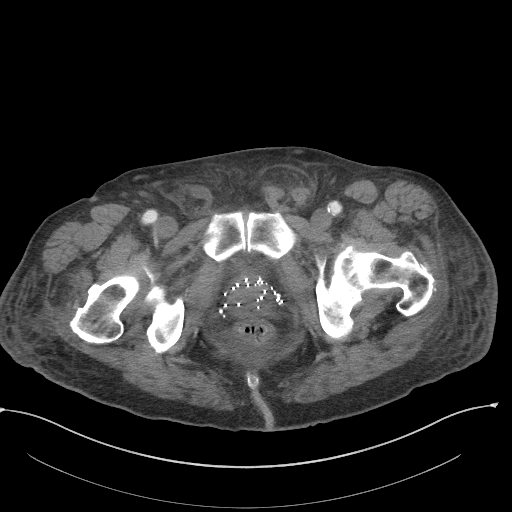
[im 30/94  soft-tissue]
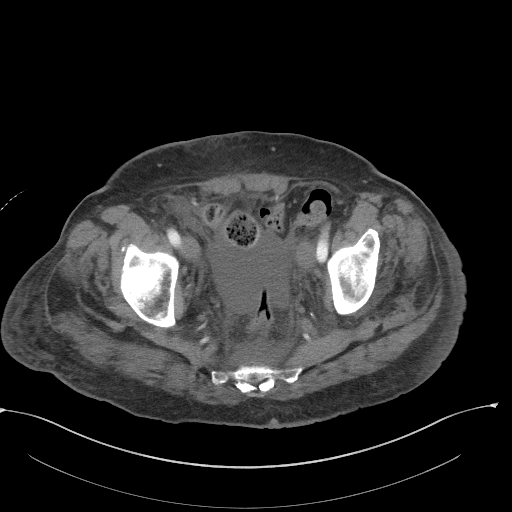
[im 35/94  soft-tissue]
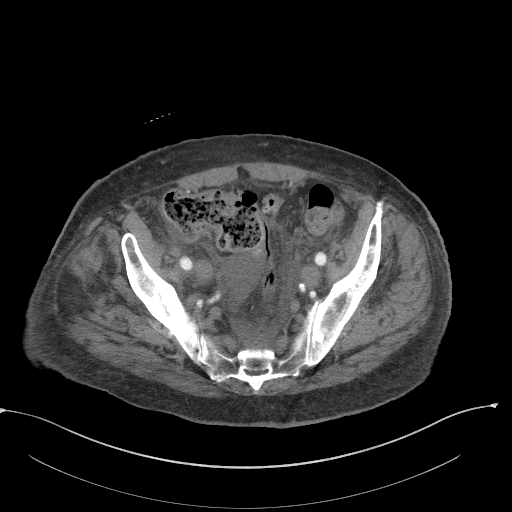
[im 45/94  soft-tissue]
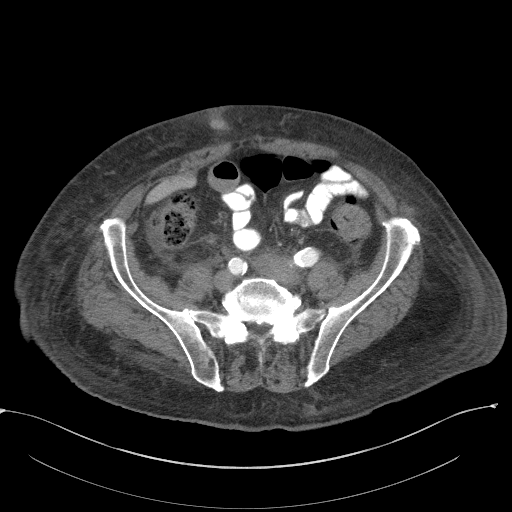
[im 49/94  soft-tissue]
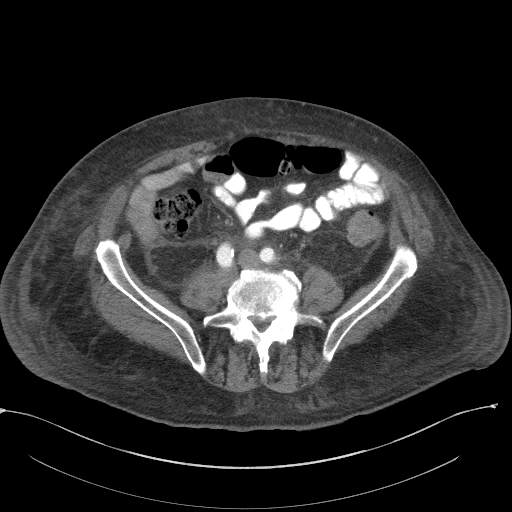
[im 59/94  soft-tissue]
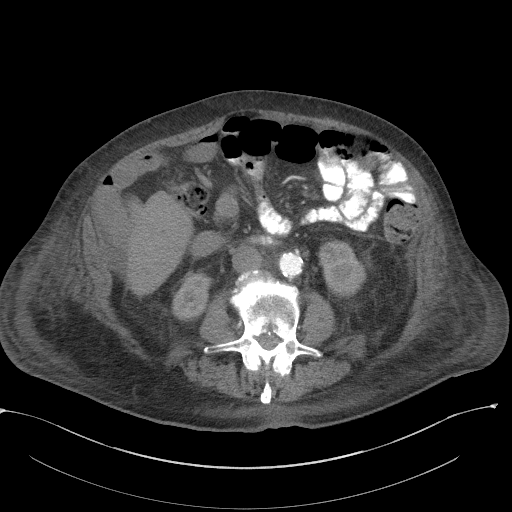
[im 64/94  soft-tissue]
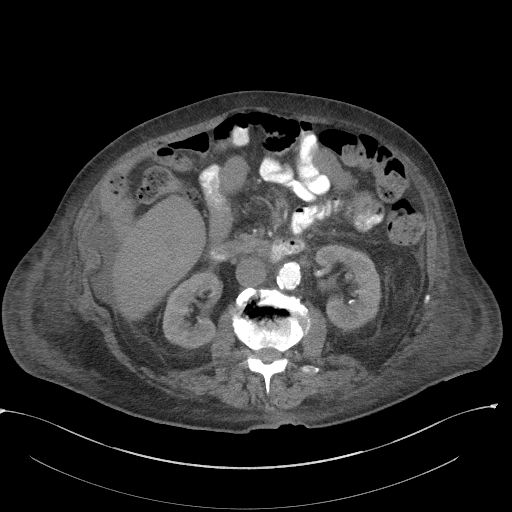
[im 64/94  bone]
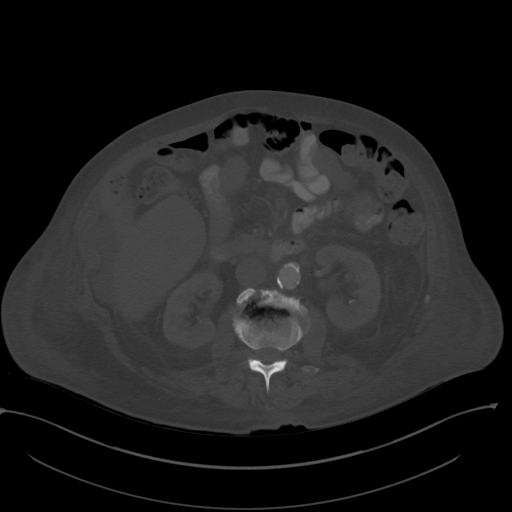
[im 74/94  soft-tissue]
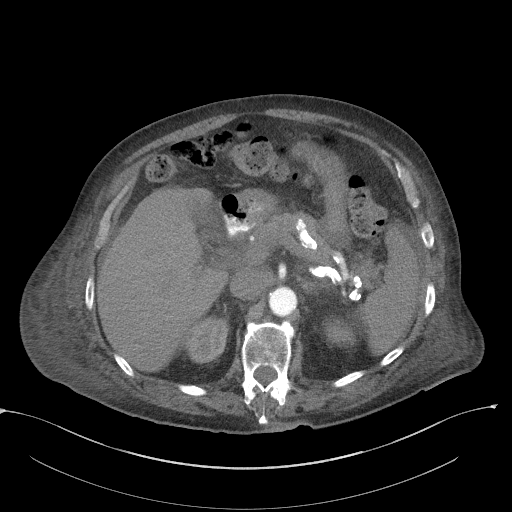
[im 79/94  soft-tissue]
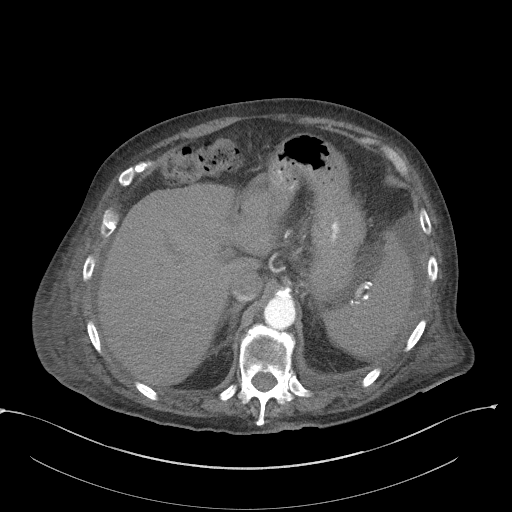
[im 89/94  soft-tissue]
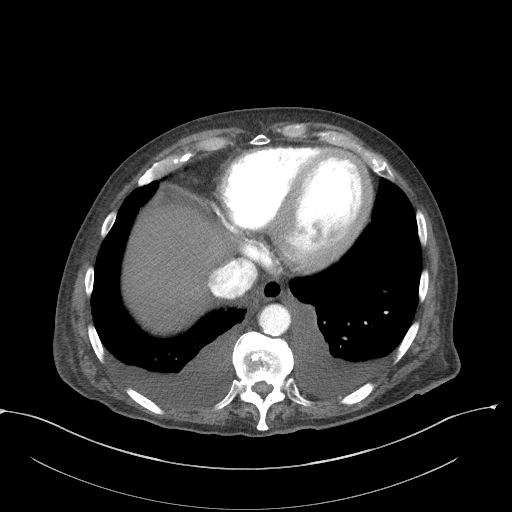

[Series 5: a/p w/ cor · coronal · 0.85mm/px · 3 of 144 slices shown]
[im 48/144  soft-tissue]
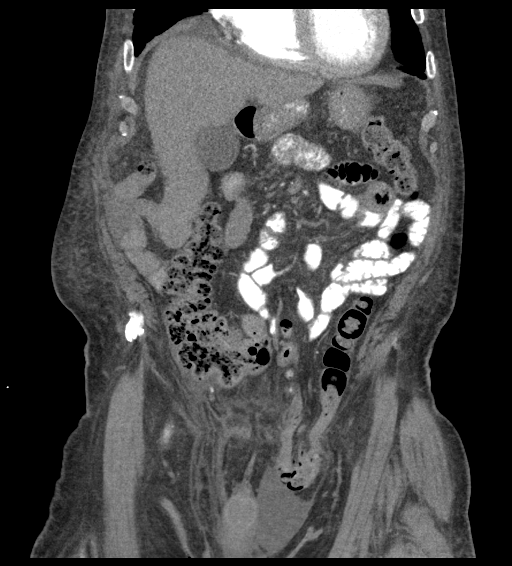
[im 64/144  soft-tissue]
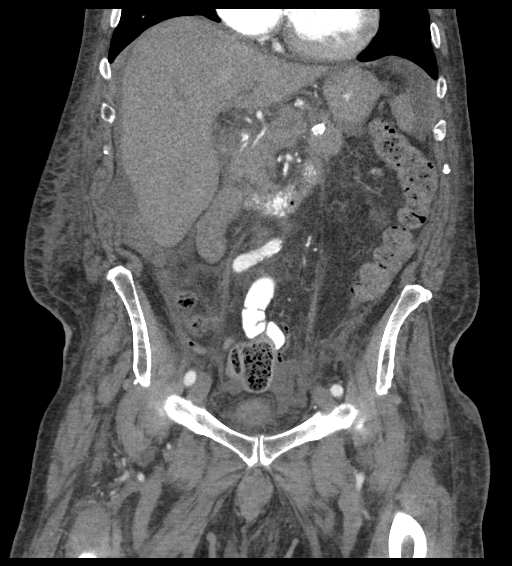
[im 80/144  soft-tissue]
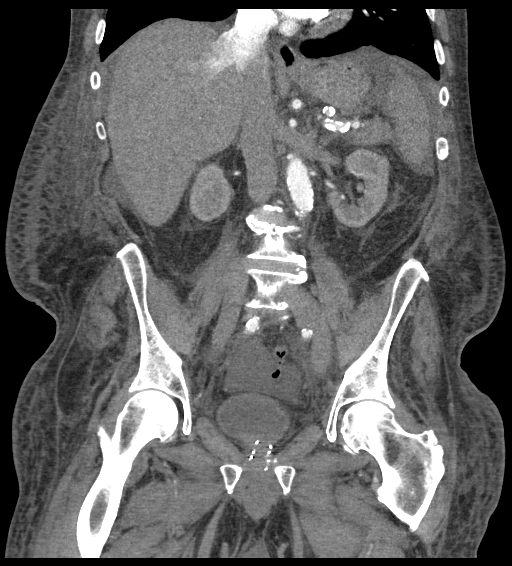

[15 of 46 positions shown; findings below may reference images not displayed]

FINDINGS: Lower chest: BILATERAL pleural effusions and minimal compressive
atelectasis lower lobes. Fell

Hepatobiliary: Suspect tiny dependent calcified gallstone in
gallbladder. Liver unremarkable

Pancreas: Normal appearance

Spleen: Normal appearance

Adrenals/Urinary Tract: Adrenal glands normal appearance.
Nonobstructing LEFT renal calculus. No renal mass, hydronephrosis or
hydroureter. Mild bladder wall thickening question in part related
to incomplete distention. Brachytherapy seed implants at prostate
bed.

Stomach/Bowel: Normal appendix. Segment of nonobstructed sigmoid
colon extends into a LEFT inguinal hernia. No associated bowel wall
thickening or obstruction. Mild sigmoid diverticulosis. Stomach
collapsed, unable to exclude gastric wall thickening. Small bowel
loops normal appearance.

Vascular/Lymphatic: Atherosclerotic calcifications aorta, iliac
arteries, coronary arteries, abdominal branch arteries including the
origins of the celiac and superior mesenteric arteries as well as
RIGHT renal artery. Heart appears mildly enlarged. No adenopathy.
Distended IVC and hepatic veins with reflux of contrast from RIGHT
atrium into IVC and hepatic veins, in combination with delayed
portal vein visualization raising question bold elevated RIGHT heart
pressure and failure.

Reproductive: N/A

Other: Scattered ascites in abdomen and pelvis as well as within
BILATERAL inguinal hernias LEFT larger than RIGHT. Scattered soft
tissue edema diffusely including presacral space terrible question
related to hypoproteinemia or anasarca.

Musculoskeletal: Osseous demineralization with degenerative disc
disease changes lumbar spine. Degenerative changes of both hips. No
acute osseous findings.
IMPRESSION: BILATERAL pleural effusions and ascites as well as scattered soft
tissue edema, could be due to failure normal hyperproteinemia, or
anasarca.

Suspect tiny calcified gallstone.

Nonobstructing LEFT renal calculus.

BILATERAL inguinal hernias containing fat and fluid on RIGHT and
containing ascites and nonobstructed sigmoid colon on LEFT.

Sigmoid diverticulosis.

Aortic atherosclerosis and mild coronary artery calcification with
supected elevated RIGHT heart pressure.
